# Patient Record
Sex: Male | Born: 1965
Health system: Southern US, Community
[De-identification: ages and names within clinical notes are randomized; demographics above are authoritative.]

## PROBLEM LIST (undated history)

## (undated) DIAGNOSIS — F419 Anxiety disorder, unspecified: Secondary | ICD-10-CM

## (undated) DIAGNOSIS — K219 Gastro-esophageal reflux disease without esophagitis: Secondary | ICD-10-CM

## (undated) DIAGNOSIS — K829 Disease of gallbladder, unspecified: Secondary | ICD-10-CM

## (undated) DIAGNOSIS — I10 Essential (primary) hypertension: Secondary | ICD-10-CM

## (undated) DIAGNOSIS — R7989 Other specified abnormal findings of blood chemistry: Secondary | ICD-10-CM

## (undated) DIAGNOSIS — M2550 Pain in unspecified joint: Secondary | ICD-10-CM

## (undated) DIAGNOSIS — G4730 Sleep apnea, unspecified: Secondary | ICD-10-CM

## (undated) DIAGNOSIS — M1990 Unspecified osteoarthritis, unspecified site: Secondary | ICD-10-CM

## (undated) DIAGNOSIS — M109 Gout, unspecified: Secondary | ICD-10-CM

## (undated) DIAGNOSIS — E291 Testicular hypofunction: Secondary | ICD-10-CM

## (undated) DIAGNOSIS — E119 Type 2 diabetes mellitus without complications: Secondary | ICD-10-CM

## (undated) DIAGNOSIS — E785 Hyperlipidemia, unspecified: Secondary | ICD-10-CM

## (undated) DIAGNOSIS — K8020 Calculus of gallbladder without cholecystitis without obstruction: Secondary | ICD-10-CM

## (undated) DIAGNOSIS — R739 Hyperglycemia, unspecified: Secondary | ICD-10-CM

## (undated) DIAGNOSIS — K589 Irritable bowel syndrome without diarrhea: Secondary | ICD-10-CM

## (undated) DIAGNOSIS — E669 Obesity, unspecified: Secondary | ICD-10-CM

## (undated) DIAGNOSIS — R112 Nausea with vomiting, unspecified: Secondary | ICD-10-CM

## (undated) DIAGNOSIS — F329 Major depressive disorder, single episode, unspecified: Secondary | ICD-10-CM

## (undated) DIAGNOSIS — F1010 Alcohol abuse, uncomplicated: Secondary | ICD-10-CM

## (undated) DIAGNOSIS — Z9889 Other specified postprocedural states: Secondary | ICD-10-CM

## (undated) HISTORY — DX: Gastro-esophageal reflux disease without esophagitis: K21.9

## (undated) HISTORY — PX: REPAIR KNEE LIGAMENT: SUR1188

## (undated) HISTORY — DX: Type 2 diabetes mellitus without complications: E11.9

## (undated) HISTORY — DX: Obesity, unspecified: E66.9

## (undated) HISTORY — DX: Anxiety disorder, unspecified: F41.9

## (undated) HISTORY — DX: Hyperlipidemia, unspecified: E78.5

## (undated) HISTORY — DX: Unspecified osteoarthritis, unspecified site: M19.90

## (undated) HISTORY — DX: Calculus of gallbladder without cholecystitis without obstruction: K80.20

## (undated) HISTORY — DX: Sleep apnea, unspecified: G47.30

## (undated) HISTORY — DX: Gout, unspecified: M10.9

## (undated) HISTORY — DX: Alcohol abuse, uncomplicated: F10.10

## (undated) HISTORY — DX: Major depressive disorder, single episode, unspecified: F32.9

## (undated) HISTORY — PX: KNEE ARTHROSCOPY: SUR90

## (undated) HISTORY — DX: Hyperglycemia, unspecified: R73.9

## (undated) HISTORY — DX: Disease of gallbladder, unspecified: K82.9

## (undated) HISTORY — DX: Essential (primary) hypertension: I10

## (undated) HISTORY — DX: Other specified abnormal findings of blood chemistry: R79.89

## (undated) HISTORY — PX: OTHER SURGICAL HISTORY: SHX169

## (undated) HISTORY — DX: Irritable bowel syndrome, unspecified: K58.9

## (undated) HISTORY — DX: Testicular hypofunction: E29.1

## (undated) HISTORY — DX: Pain in unspecified joint: M25.50

---

## 1999-05-24 ENCOUNTER — Emergency Department (HOSPITAL_COMMUNITY): Admission: EM | Admit: 1999-05-24 | Discharge: 1999-05-24 | Payer: Self-pay | Admitting: Emergency Medicine

## 2002-05-06 ENCOUNTER — Emergency Department (HOSPITAL_COMMUNITY): Admission: EM | Admit: 2002-05-06 | Discharge: 2002-05-06 | Payer: Self-pay | Admitting: Emergency Medicine

## 2002-08-10 ENCOUNTER — Ambulatory Visit (HOSPITAL_BASED_OUTPATIENT_CLINIC_OR_DEPARTMENT_OTHER): Admission: RE | Admit: 2002-08-10 | Discharge: 2002-08-10 | Payer: Self-pay | Admitting: General Surgery

## 2002-08-10 ENCOUNTER — Encounter (INDEPENDENT_AMBULATORY_CARE_PROVIDER_SITE_OTHER): Payer: Self-pay | Admitting: *Deleted

## 2003-08-23 ENCOUNTER — Emergency Department (HOSPITAL_COMMUNITY): Admission: EM | Admit: 2003-08-23 | Discharge: 2003-08-23 | Payer: Self-pay | Admitting: Emergency Medicine

## 2004-05-26 ENCOUNTER — Ambulatory Visit: Payer: Self-pay | Admitting: Internal Medicine

## 2004-06-02 ENCOUNTER — Ambulatory Visit: Payer: Self-pay | Admitting: Internal Medicine

## 2004-07-15 ENCOUNTER — Ambulatory Visit: Payer: Self-pay | Admitting: Internal Medicine

## 2004-09-11 ENCOUNTER — Ambulatory Visit: Payer: Self-pay | Admitting: Internal Medicine

## 2004-11-05 ENCOUNTER — Ambulatory Visit: Payer: Self-pay | Admitting: Internal Medicine

## 2005-09-29 ENCOUNTER — Ambulatory Visit: Payer: Self-pay | Admitting: Internal Medicine

## 2005-10-07 ENCOUNTER — Ambulatory Visit: Payer: Self-pay | Admitting: Internal Medicine

## 2005-10-18 ENCOUNTER — Ambulatory Visit: Payer: Self-pay | Admitting: Internal Medicine

## 2005-11-29 ENCOUNTER — Encounter: Admission: RE | Admit: 2005-11-29 | Discharge: 2005-11-29 | Payer: Self-pay | Admitting: Occupational Medicine

## 2006-04-11 ENCOUNTER — Encounter: Admission: RE | Admit: 2006-04-11 | Discharge: 2006-04-11 | Payer: Self-pay | Admitting: Orthopedic Surgery

## 2006-05-05 ENCOUNTER — Encounter: Admission: RE | Admit: 2006-05-05 | Discharge: 2006-05-05 | Payer: Self-pay | Admitting: Orthopedic Surgery

## 2006-06-07 ENCOUNTER — Ambulatory Visit: Payer: Self-pay | Admitting: Internal Medicine

## 2006-06-07 LAB — CONVERTED CEMR LAB
Glucose, Bld: 336 mg/dL — ABNORMAL HIGH (ref 70–99)
Hgb A1c MFr Bld: 11.3 % — ABNORMAL HIGH (ref 4.6–6.0)

## 2006-06-09 ENCOUNTER — Ambulatory Visit: Payer: Self-pay | Admitting: Internal Medicine

## 2006-07-19 ENCOUNTER — Encounter: Admission: RE | Admit: 2006-07-19 | Discharge: 2006-10-17 | Payer: Self-pay | Admitting: Internal Medicine

## 2006-08-15 ENCOUNTER — Ambulatory Visit: Payer: Self-pay | Admitting: Internal Medicine

## 2006-08-16 ENCOUNTER — Encounter: Payer: Self-pay | Admitting: Internal Medicine

## 2006-11-28 ENCOUNTER — Ambulatory Visit: Payer: Self-pay | Admitting: Internal Medicine

## 2006-11-29 LAB — CONVERTED CEMR LAB
Cholesterol: 152 mg/dL (ref 0–200)
LDL Cholesterol: 99 mg/dL (ref 0–99)
VLDL: 15 mg/dL (ref 0–40)

## 2007-03-15 ENCOUNTER — Encounter: Payer: Self-pay | Admitting: Internal Medicine

## 2007-03-15 DIAGNOSIS — R7309 Other abnormal glucose: Secondary | ICD-10-CM | POA: Insufficient documentation

## 2007-03-15 DIAGNOSIS — I10 Essential (primary) hypertension: Secondary | ICD-10-CM | POA: Insufficient documentation

## 2007-03-15 DIAGNOSIS — E669 Obesity, unspecified: Secondary | ICD-10-CM | POA: Insufficient documentation

## 2007-03-15 DIAGNOSIS — E785 Hyperlipidemia, unspecified: Secondary | ICD-10-CM | POA: Insufficient documentation

## 2007-06-23 ENCOUNTER — Ambulatory Visit: Payer: Self-pay | Admitting: Internal Medicine

## 2007-06-23 DIAGNOSIS — L989 Disorder of the skin and subcutaneous tissue, unspecified: Secondary | ICD-10-CM | POA: Insufficient documentation

## 2007-06-23 DIAGNOSIS — H6120 Impacted cerumen, unspecified ear: Secondary | ICD-10-CM | POA: Insufficient documentation

## 2007-06-23 LAB — CONVERTED CEMR LAB
BUN: 9 mg/dL (ref 6–23)
Calcium: 9.4 mg/dL (ref 8.4–10.5)
GFR calc Af Amer: 119 mL/min
GFR calc non Af Amer: 98 mL/min
Hgb A1c MFr Bld: 5.8 % (ref 4.6–6.0)

## 2007-06-25 ENCOUNTER — Encounter: Payer: Self-pay | Admitting: Internal Medicine

## 2007-06-28 ENCOUNTER — Telehealth: Payer: Self-pay | Admitting: Internal Medicine

## 2007-10-10 ENCOUNTER — Ambulatory Visit: Payer: Self-pay | Admitting: Internal Medicine

## 2007-10-10 LAB — CONVERTED CEMR LAB: Hgb A1c MFr Bld: 6 % (ref 4.6–6.0)

## 2007-10-12 ENCOUNTER — Encounter: Payer: Self-pay | Admitting: Internal Medicine

## 2007-11-08 ENCOUNTER — Ambulatory Visit: Payer: Self-pay | Admitting: Internal Medicine

## 2007-11-08 DIAGNOSIS — D485 Neoplasm of uncertain behavior of skin: Secondary | ICD-10-CM | POA: Insufficient documentation

## 2007-11-15 ENCOUNTER — Ambulatory Visit: Payer: Self-pay | Admitting: Internal Medicine

## 2007-11-23 ENCOUNTER — Ambulatory Visit: Payer: Self-pay | Admitting: Internal Medicine

## 2007-11-23 DIAGNOSIS — J069 Acute upper respiratory infection, unspecified: Secondary | ICD-10-CM | POA: Insufficient documentation

## 2008-07-10 ENCOUNTER — Encounter: Admission: RE | Admit: 2008-07-10 | Discharge: 2008-07-10 | Payer: Self-pay | Admitting: Internal Medicine

## 2009-10-17 ENCOUNTER — Encounter: Payer: Self-pay | Admitting: Internal Medicine

## 2010-08-11 ENCOUNTER — Telehealth (INDEPENDENT_AMBULATORY_CARE_PROVIDER_SITE_OTHER): Payer: Self-pay | Admitting: *Deleted

## 2010-08-13 NOTE — Letter (Signed)
Summary: Michael Litter MD  Michael Litter MD   Imported By: Sherian Rein 10/27/2009 08:27:59  _____________________________________________________________________  External Attachment:    Type:   Image     Comment:   External Document

## 2010-08-19 NOTE — Progress Notes (Signed)
  Phone Note Other Incoming   Request: Send information Summary of Call: Request for records received from [employer]. Request forwarded to [employer].  11/18/2005-11/18/2008.

## 2010-09-23 ENCOUNTER — Other Ambulatory Visit: Payer: Self-pay

## 2010-09-29 ENCOUNTER — Encounter: Payer: Self-pay | Admitting: Internal Medicine

## 2010-10-22 ENCOUNTER — Encounter: Payer: Self-pay | Admitting: Sports Medicine

## 2010-10-22 ENCOUNTER — Ambulatory Visit (INDEPENDENT_AMBULATORY_CARE_PROVIDER_SITE_OTHER): Payer: BLUE CROSS/BLUE SHIELD | Admitting: Sports Medicine

## 2010-10-22 DIAGNOSIS — R269 Unspecified abnormalities of gait and mobility: Secondary | ICD-10-CM

## 2010-10-22 DIAGNOSIS — M179 Osteoarthritis of knee, unspecified: Secondary | ICD-10-CM

## 2010-10-22 DIAGNOSIS — M25562 Pain in left knee: Secondary | ICD-10-CM

## 2010-10-22 DIAGNOSIS — IMO0002 Reserved for concepts with insufficient information to code with codable children: Secondary | ICD-10-CM

## 2010-10-22 DIAGNOSIS — M25561 Pain in right knee: Secondary | ICD-10-CM

## 2010-10-22 DIAGNOSIS — M25569 Pain in unspecified knee: Secondary | ICD-10-CM

## 2010-10-22 DIAGNOSIS — M1710 Unilateral primary osteoarthritis, unspecified knee: Secondary | ICD-10-CM

## 2010-10-22 MED ORDER — AMITRIPTYLINE HCL 25 MG PO TABS: 25.0000 mg | ORAL_TABLET | Freq: Every day | ORAL | Status: DC

## 2010-10-22 NOTE — Assessment & Plan Note (Signed)
-   Bilateral knee pain secondary to severe arthritis. - Patient has artery had extensive therapy for his knees including multiple cortisone injections, Visco supplementation, physical therapy, hinged knee braces, and arthroscopy without improvement.  He is currently on the appropriate medications as well. - Will increase tramadol to 50 mg 2 caps twice a day to help with his pain. - Continue meloxicam at current dose. - Started on amitriptyline 25 mg each bedtime to help with nighttime symptoms. - Continue knee braces. - At this time only conservative measures remaining include orthotics, he was fitted with sports insoles with scaphoid pads in hopes of providing more cushion. If he gets good response from these, would possibly consider custom orthotics in the future. - Did emphasize that based on severity of arthritis total knee replacement as definitive treatment and may help improve his quality of life. - He can followup with [place] on an as-needed basis. - Followup with Dr. Thomasena Edis as scheduled.

## 2010-10-22 NOTE — Progress Notes (Signed)
  Subjective:    Patient ID: [name], male    DOB: Dec 03, 1965, 45 y.o.   MRN: [id number]  HPI 45 year old male to the office for evaluation of bilateral knee pain. Has had ongoing knee pain for several years has undergone extensive treatment with Dr. Thomasena Edis at [facility].  Pain started several years ago following injuries related to [employer], patient was in the [employer] and had done service in the [place]. Been experiencing increasing knee pain recently. Underwent left knee arthroscopy 08/2009 by Dr. Thomasena Edis for meniscal tear and right knee arthroscopy 06/29/10 also for meniscal tear, both knees were noted to have severe arthritis with grade 4 changes of medial compartments bilaterally and grade 3-4 changes in the lateral compartments of both knees.  Has completed multiple cortisone injections & 2 series of Visco supplementation on the left knee and 1 series on the right knee without improvement. Physical therapy has not been helpful in the past. He is wearing double hinged knee braces which do offer some support. Current medications include tramadol 50 mg twice a day, Mobic, and hydrocodone as needed. Currently works in Set [employer] and is on his feet for long periods of time and has significant pain at the end of the day. Has significant nighttime pain. Significant stiffness throughout the day.  Previous x-rays nor surgical records available for review from Dr. Thomasena Edis.  No chronic medical conditions. NKDA Current medications: Tramadol, Mobic, hydrocodone as needed   Review of Systems    per history of present illness, otherwise review of systems is negative Objective:   Physical Exam GENERAL: Alert and oriented x3, no acute distress, pleasant SKIN: No rashes or lesions Respirations: 15 and unlabored MSK: - Right knee: Range of motion -5-110 with significant pain. Arthroscopy portals well healed. Positive joint effusion with increased warmth, no surrounding erythema or  bruising. Exquisitely tender over medial and lateral joint lines. No ligamentous laxity. - Left knee:  Range of motion -5-110 with significant pain. Arthroscopy portals well healed. Small joint effusion without increased warmth or surrounding erythema or bruising. Exquisitely tender over medial and lateral joint lines. No ligamentous laxity. - Feet: bilateral pes planus, large bunion on left foot,  limited great toe motion bilaterally - Gait: Noted to have increased genu varum with standing.  No leg length difference. Walks with significantly antalgic gait favoring his right leg. NEURO: Sensation intact to light touch VASCULAR: Pulses 2+ and symmetric dorsalis pedis and posterior tibialis bilaterally       Assessment & Plan:

## 2010-10-22 NOTE — Assessment & Plan Note (Signed)
Bilateral pes planus and increased genu varum related to knee arthritis Fitted with sports insoles with scaphoid pads as noted above.

## 2010-10-22 NOTE — Patient Instructions (Signed)
You have tried all proper treatments to this point, you are likely eligible for disability given the advanced arthritis you have. Start taking amitriptyline 25mg  1 tab at bedtime. Increase your tramadol to 50mg  2-tabs twice a day. Wear the insoles in your shoes, if comfortable & helpful we could consider custom orthotics in the future. Follow-up with [place] as needed. Follow-up with Dr. Thomasena Edis as scheduled.

## 2010-10-22 NOTE — Assessment & Plan Note (Signed)
Severe arthritis of both knees with grade 3-4 changes noted on arthroscopy by Dr. Thomasena Edis in the past. Given the extent of arthritis and pain patient would likely be eligible for disability. He will look into this in the future and notify us if we can help him with any disability paperwork. Plan as noted above.

## 2010-11-20 ENCOUNTER — Other Ambulatory Visit: Payer: Self-pay | Admitting: *Deleted

## 2010-11-20 MED ORDER — TRAMADOL HCL 50 MG PO TABS: 50.0000 mg | ORAL_TABLET | Freq: Two times a day (BID) | ORAL | Status: DC | PRN

## 2010-11-20 MED ORDER — MELOXICAM 15 MG PO TABS: 15.0000 mg | ORAL_TABLET | Freq: Every day | ORAL | Status: DC

## 2010-11-27 ENCOUNTER — Other Ambulatory Visit: Payer: Self-pay

## 2010-11-27 ENCOUNTER — Other Ambulatory Visit: Payer: Self-pay | Admitting: Internal Medicine

## 2010-11-27 DIAGNOSIS — Z0000 Encounter for general adult medical examination without abnormal findings: Secondary | ICD-10-CM

## 2010-11-27 NOTE — Op Note (Signed)
   NAME:  EVO, ADERMAN                            ACCOUNT NO.:  [id number]   MEDICAL RECORD NO.:  [id number]                   PATIENT TYPE:  AMB   LOCATION:  DSC                                  FACILITY:  [facility]   PHYSICIAN:  Rose Phi. Maple Hudson, M.D.                DATE OF BIRTH:  27-Jun-1966   DATE OF PROCEDURE:  08/10/2002  DATE OF DISCHARGE:                                 OPERATIVE REPORT   PREOPERATIVE DIAGNOSIS:  Cyst of the back.   POSTOPERATIVE DIAGNOSIS:  Cyst of the back.   PROCEDURE:  Excision of cyst of the back.   SURGEON:  Rose Phi. Maple Hudson, M.D.   ANESTHESIA:  General.   DESCRIPTION OF PROCEDURE:  This patient had had a previous I&D of an  infected cyst on his back measuring some 2 cm in diameter.  It is healed up  and now scheduled for excision.   After a suitable general anesthesia was induced, the patient was placed in  the prone position and then the back prepped and draped in the usual  fashion.  A longitudinal incision was made, and we excised the entire cyst.  There was no contamination.  The defect was about 6 x 3 cm.  A single-layer  closure of interrupted 4-0 nylon was carried out.  Dressing applied.  The  patient was transferred to the recovery room in satisfactory condition,  having tolerated the procedure well.                                               Rose Phi. Maple Hudson, M.D.    PRY/MEDQ  D:  08/10/2002  T:  08/10/2002  Job:  [id number]

## 2010-11-30 ENCOUNTER — Encounter: Payer: Self-pay | Admitting: Internal Medicine

## 2011-01-01 ENCOUNTER — Other Ambulatory Visit: Payer: Self-pay | Admitting: Sports Medicine

## 2011-01-20 ENCOUNTER — Other Ambulatory Visit: Payer: Self-pay | Admitting: Orthopedic Surgery

## 2011-01-20 ENCOUNTER — Encounter (HOSPITAL_COMMUNITY): Payer: BLUE CROSS/BLUE SHIELD

## 2011-01-20 LAB — BASIC METABOLIC PANEL
CO2: 28 mEq/L (ref 19–32)
Calcium: 9.4 mg/dL (ref 8.4–10.5)
Chloride: 103 mEq/L (ref 96–112)
Creatinine, Ser: 0.79 mg/dL (ref 0.50–1.35)
Glucose, Bld: 120 mg/dL — ABNORMAL HIGH (ref 70–99)

## 2011-01-20 LAB — DIFFERENTIAL
Eosinophils Absolute: 0.1 10*3/uL (ref 0.0–0.7)
Eosinophils Relative: 1 % (ref 0–5)
Lymphs Abs: 2.1 10*3/uL (ref 0.7–4.0)
Monocytes Absolute: 0.6 10*3/uL (ref 0.1–1.0)
Monocytes Relative: 9 % (ref 3–12)

## 2011-01-20 LAB — URINALYSIS, ROUTINE W REFLEX MICROSCOPIC
Ketones, ur: NEGATIVE mg/dL
Leukocytes, UA: NEGATIVE
Protein, ur: NEGATIVE mg/dL
Urobilinogen, UA: 1 mg/dL (ref 0.0–1.0)

## 2011-01-20 LAB — CBC
MCH: 25.2 pg — ABNORMAL LOW (ref 26.0–34.0)
MCHC: 31.8 g/dL (ref 30.0–36.0)
MCV: 79.3 fL (ref 78.0–100.0)
Platelets: 201 10*3/uL (ref 150–400)
RDW: 13.9 % (ref 11.5–15.5)

## 2011-01-20 LAB — SURGICAL PCR SCREEN: MRSA, PCR: NEGATIVE

## 2011-01-26 ENCOUNTER — Inpatient Hospital Stay (HOSPITAL_COMMUNITY)
Admission: RE | Admit: 2011-01-26 | Discharge: 2011-01-28 | DRG: 209 | Disposition: A | Discharge status: Home Health | Payer: BLUE CROSS/BLUE SHIELD | Source: Ambulatory Visit | Attending: Orthopedic Surgery | Admitting: Orthopedic Surgery

## 2011-01-26 DIAGNOSIS — E119 Type 2 diabetes mellitus without complications: Secondary | ICD-10-CM | POA: Diagnosis present

## 2011-01-26 DIAGNOSIS — I10 Essential (primary) hypertension: Secondary | ICD-10-CM | POA: Diagnosis present

## 2011-01-26 DIAGNOSIS — Z01812 Encounter for preprocedural laboratory examination: Secondary | ICD-10-CM

## 2011-01-26 DIAGNOSIS — M1710 Unilateral primary osteoarthritis, unspecified knee: Principal | ICD-10-CM | POA: Diagnosis present

## 2011-01-26 DIAGNOSIS — E669 Obesity, unspecified: Secondary | ICD-10-CM | POA: Diagnosis present

## 2011-01-26 HISTORY — PX: JOINT REPLACEMENT: SHX530

## 2011-01-26 LAB — GLUCOSE, CAPILLARY
Glucose-Capillary: 123 mg/dL — ABNORMAL HIGH (ref 70–99)
Glucose-Capillary: 129 mg/dL — ABNORMAL HIGH (ref 70–99)

## 2011-01-27 LAB — CBC
MCV: 79.1 fL (ref 78.0–100.0)
Platelets: 172 10*3/uL (ref 150–400)
RBC: 4.22 MIL/uL (ref 4.22–5.81)
RDW: 13.9 % (ref 11.5–15.5)
WBC: 9.3 10*3/uL (ref 4.0–10.5)

## 2011-01-27 LAB — BASIC METABOLIC PANEL
CO2: 28 mEq/L (ref 19–32)
Chloride: 101 mEq/L (ref 96–112)
GFR calc Af Amer: >60 mL/min (ref >60)
Potassium: 4.1 mEq/L (ref 3.5–5.1)
Sodium: 135 mEq/L (ref 135–145)

## 2011-01-28 LAB — BASIC METABOLIC PANEL
BUN: 7 mg/dL (ref 6–23)
CO2: 32 mEq/L (ref 19–32)
Chloride: 98 mEq/L (ref 96–112)
Creatinine, Ser: 0.87 mg/dL (ref 0.50–1.35)
GFR calc Af Amer: >60 mL/min (ref >60)
Glucose, Bld: 126 mg/dL — ABNORMAL HIGH (ref 70–99)
Potassium: 3.8 mEq/L (ref 3.5–5.1)

## 2011-01-28 LAB — CBC
HCT: 32.6 % — ABNORMAL LOW (ref 39.0–52.0)
Hemoglobin: 10.5 g/dL — ABNORMAL LOW (ref 13.0–17.0)
MCV: 79.1 fL (ref 78.0–100.0)
RBC: 4.12 MIL/uL — ABNORMAL LOW (ref 4.22–5.81)
RDW: 13.7 % (ref 11.5–15.5)
WBC: 11.1 10*3/uL — ABNORMAL HIGH (ref 4.0–10.5)

## 2011-02-01 NOTE — Op Note (Signed)
NAMEFRUTOSO, DIMARE                  ACCOUNT NO.:  [id number]  MEDICAL RECORD NO.:  [id number]  LOCATION:  0003                         FACILITY:  [facility]  PHYSICIAN:  Madlyn Frankel. Charlann Boxer, M.D.  DATE OF BIRTH:  08/16/1965  DATE OF PROCEDURE:  01/26/2011 DATE OF DISCHARGE:                              OPERATIVE REPORT   PREOPERATIVE DIAGNOSIS:  Right knee osteoarthritis.  POSTOPERATIVE DIAGNOSIS:  Right knee osteoarthritis.  PROCEDURE:  Right total knee replacement.  COMPONENTS USED:  DePuy rotating platform posterior stabilized knee system with size 6 femur, 6 tibia, 12.5 insert and a 41 patellar button.  SURGEON:  Madlyn Frankel. Charlann Boxer, M.D.  ASSISTANT:  Jaquelyn Bitter. Chabon, P.A.  ANESTHESIA:  Spinal.  SPECIMENS:  None.  COMPLICATIONS:  None.  DRAINS:  One Hemovac.  TOURNIQUET TIME:  49 minutes at 250 mmHg.  INDICATIONS FOR PROCEDURE:  Mr. Wissing is a 45 year old gentleman who was referred for surgical consultation due to failure respond to conservative measures for bilateral knee osteoarthritis.  He is a large male with significant pain that is limiting his quality of life.  After extensive review of risk of infection, component failure, need for revision surgery, given his age, the risk of infection, the risk of failure, he wished to proceed with arthroplasty for benefit of pain relief.  The discussion was extensive and exhaustive trying to make certain that he was making the right incision.  He wished to proceed. Consent was obtained for benefit of pain relief.  PROCEDURE IN DETAIL:  The patient was brought to operative theater. Once adequate anesthesia, preoperative antibiotics administered, the patient was positioned supine with the right thigh tourniquet placed. Right lower extremity was prepped and draped in sterile fashion with a right foot placed into Mayo leg holder.  Time-out was performed, identifying the patient, planned procedure and extremity.  Leg  was exsanguinated, tourniquet elevated to 250 mmHg.  Midline incision was made followed by median arthrotomy.  Following initial exposure and debridement, attention was directed to patella.  Precut measurement was approximately 28 to 29 mm.  I resected down to 15 mm using a 41 patellar button to restore height.  Lug holes were drilled and a metal shim was placed to protect patella from retractors and saw blades.  Attention was now directed to the femur.  Femoral canal was opened and drilled, irrigated to try to prevent fatty emboli.  Intramedullary rod was passed at 5 degrees of valgus, 11 mm bone resected off the distal femur for possible need for use of a size 6 femoral component.  Following this cut, the tibia was subluxated anteriorly and using extramedullary guide, a perpendicular cut was made on this proximal tibia, measured at 10 mm off the proximal lateral side.  Following this resection, we confirmed that the medial and lateral collateral ligaments were going to be stable with at least 10 mm insert and balanced.  Also confirmed that the cut was perpendicular in the coronal plane.  At this point, I sized the femur to be a size 6, the size 6 instrumentation was opened.  The size 6 rotation block was then pinned into position, anterior reference using the C-clamp to  set rotation.  The 4 in 1 cutting block was placed and anterior, posterior and chamfer cuts made without difficulty nor notching.  Final box cut was made up the lateral aspect of distal femur.  Following this resection, the tibia was subluxated anteriorly.  The final box cut of the femur was made off the lateral aspect of distal femur.  The tibia subluxated anteriorly and the cut surfacing to be best fit with size 6.  Tibial tray was pinned into position through the tibial tubercle, drilled and keel punched.  Trial reduction was now carried out.  With a trial reduction at 10 mm insert, I felt there was just a  little bit of hyperextension.  The 12.5 insert felt better balance from extension to flexion with stable medial, lateral, and collateral ligaments throughout with normal tracking patella.  Trial components removed.  Sclerotic bone was drilled with a smooth pin to help with cement interdigitation.  At the synovial capsule junction, the knee was injected with 0.25% Marcaine with epinephrine and 1 mL of Toradol, 51 mL total.  The knee was irrigated with normal saline solution and pulse lavage.  Final components were opened and cement mixed.  Final components were then cemented and the knee was brought to extension with 12.5 insert and extruded cement was removed.  Once the cement fully cured, excess cement was removed throughout the knee.  A final 12.5 insert was chosen and placed into the knee.  The knee was re- irrigated.  The tourniquet had been let down after 49 minutes.  A medium Hemovac drain was placed deep.  The extensor mechanism was then reapproximated using #1 Vicryl with the knee in flexion.  The remainder of the wound was closed with 2-0 Vicryl and running 4-0 Monocryl.  The knee was cleaned, dried and dressed sterilely with Dermabond and Aquacel dressing.  Drain site dressed separately.  Ace wrap over the knee.  The patient was then brought to recovery room in stable condition, tolerating the procedure well.     Madlyn Frankel Charlann Boxer, M.D.     MDO/MEDQ  D:  01/26/2011  T:  01/26/2011  Job:  [id number]  Electronically Signed by Durene Romans M.D. on 02/01/2011 10:14:45 AM

## 2011-02-12 ENCOUNTER — Other Ambulatory Visit: Payer: Self-pay | Admitting: Sports Medicine

## 2011-02-13 NOTE — Discharge Summary (Signed)
[name], [name]                  ACCOUNT NO.:  [id number]  MEDICAL RECORD NO.:  [id number]  LOCATION:  1602                         FACILITY:  [facility]  PHYSICIAN:  Madlyn Frankel. Charlann Boxer, M.D.  DATE OF BIRTH:  February 28, 1966  DATE OF ADMISSION:  01/26/2011 DATE OF DISCHARGE:  01/28/2011                              DISCHARGE SUMMARY   PROCEDURE:  Right total knee arthroplasty.  ADMITTING DIAGNOSIS:  Right knee osteoarthritis.  DISCHARGE DIAGNOSIS:  Status post right total knee arthroplasty.  HISTORY OF PRESENT ILLNESS:  The patient is a 45 year old white gentleman with a history of osteoarthritis of the right knee.  The patient has tried several conservative treatments, all of which have failed to alleviate his discomfort.  X-rays in the clinic show osteoarthritis of the right knee.  Various options were discussed with the patient.  The patient wishes to proceed with surgery.  Risks, benefits, and expectations of the procedure were discussed with the patient.  The patient understands risks, benefits, and expectations and wishes to proceed with right total knee arthroplasty by Dr. Charlann Boxer on January 26, 2011.  The patient is a candidate for tranexamic acid.  The patient is also going to home after being discharged from the hospital and has been given his postop medications.  Questions were invited and answered and surgery would proceed as scheduled.  HOSPITAL COURSE:  The patient underwent the above stated procedure on January 26, 2011.  The patient tolerated the procedure well, was brought to the recovery room in good condition and subsequently to the floor.  On postoperative day 1, January 27, 2011, the patient was doing well.  The patient did have difficulty with pain, was placed on a PCA after the night of surgery.  The patient's hematocrit was 33.4.  Hemovac was taken out.  The patient will start physical therapy.  On postop day #2, January 28, 2011, the patient is doing really well, only  complaining of occasional muscle spasm, otherwise afebrile, vital signs stable.  He is distally neurovascularly intact.  The patient's drain dressing was changed.  The patient felt that he was doing well enough to be discharged home.  The patient would be discharged home on January 28, 2011.  DISCHARGE CONDITION:  Good.  DISCHARGE INSTRUCTIONS:  The patient will be discharged to home on January 28, 2011.  The patient will be weight bearing as tolerated.  The patient will maintain his surgical dressing for 8 days, at which time, he will remove it and replace it with gauze and tape.  The patient should keep the area clean until followup.  The patient will follow up with Dr. Charlann Boxer at [facility] in 2 weeks.  DISCHARGE MEDICATIONS: 1. Aspirin 325 mg 1 p.o. b.i.d. x6 weeks. 2. Robaxin 500 mg 1 p.o. q.6 h. p.r.n. muscle spasms. 3. Iron sulfate 325 mg 1 p.o. t.i.d. for 2-3 weeks. 4. Colace 100 mg 1 p.o. b.i.d., constipation. 5. MiraLax 17 g 1 p.o. daily, constipation. 6. Tylenol 1000 mg 1 p.o. q.8 h. 7. Cromolyn sodium ophthalmic 4% 1 drop in both eyes b.i.d.    ______________________________ Lanney Gins, PA   ______________________________ Madlyn Frankel. Charlann Boxer, M.D.  MB/MEDQ  D:  02/10/2011  T:  02/11/2011  Job:  [id number]  Electronically Signed by Lanney Gins PA on 02/11/2011 03:59:19 PM Electronically Signed by Durene Romans M.D. on 02/13/2011 09:17:26 AM

## 2011-02-22 NOTE — H&P (Signed)
  [name], OSOWSKI                  ACCOUNT NO.:  [id number]  MEDICAL RECORD NO.:  [id number]  LOCATION:  1602                         FACILITY:  [facility]  PHYSICIAN:  Madlyn Frankel. Charlann Boxer, M.D.  DATE OF BIRTH:  1966-01-31  DATE OF ADMISSION:  01/26/2011 DATE OF DISCHARGE:  01/28/2011                             HISTORY & PHYSICAL   DATE OF SURGERY:  January 26, 2011.  ADMISSION DIAGNOSIS:  Osteoarthritis, right knee.  HISTORY OF PRESENT ILLNESS:  This is a 45 year old gentleman with a history of osteoarthritis of his right knee with failure to conservative treatment to alleviate his discomfort.  After discussion of treatments, benefits, risks and options, the patient now scheduled for total knee arthroplasty.  Note that he is a candidate for tranexamic acid and will receive that at surgery.  He is given his home prescriptions of aspirin, Robaxin, iron, MiraLax and Colace for postop.  Questions were invited and answered, and Surgery to go ahead as scheduled.  PAST MEDICAL HISTORY:  DRUG ALLERGIES:  None.  CURRENT MEDICATIONS:  Meloxicam 15 mg once a day, tramadol 50 mg one q.6 p.r.n., and hydrocodone p.r.n.  PREVIOUS SURGERIES:  Right elbow surgery, left shoulder surgery x3, right foot surgery x2, cyst removed from his back, and bilateral knee scopes.  SERIOUS MEDICAL ILLNESSES:  None.  FAMILY HISTORY:  Positive for sarcoidosis in the mother and ovarian cancer in the grandmother.  SOCIAL HISTORY:  Single.  He lives at home.  He does not smoke and has about two drinks a month.  REVIEW OF SYSTEMS:  CENTRAL NERVOUS SYSTEM:  Negative for headache, blurred vision, or dizziness.  PULMONARY:  Negative for shortness breath, PND and orthopnea.  CARDIOVASCULAR: Negative for chest pain and palpitations.  GI:  Negative for ulcers, hepatitis.  GU:  Negative for urinary tract difficulty.  MUSCULOSKELETAL:  Positive in the HPI.  PHYSICAL EXAM:  GENERAL:  This is an obese gentleman in no  acute distress. VITAL SIGNS:  BP 138/90, respirations 18, pulse 80 and regular. HEENT:  Head normocephalic.  Nose patent.  Ears patent.  Pupils equal, round, reactive to light.  Throat without injection. NECK:  Supple without adenopathy.  Carotids 2+ without bruit. CHEST:  Clear to auscultation.  No rales or rhonchi.  Respirations 18. HEART:  Regular rate and rhythm at 80 beats per minute without murmur. ABDOMEN:  Soft with active bowel sounds.  No masses, organomegaly. NEUROLOGIC:  The patient alert and oriented to time, place, person. Cranial nerves II-XII grossly intact.  Extremity shows the right knee to have 3-degree flexion contraction with 110 degrees of further flexion. Neurovascular status intact.  IMPRESSION:  Right knee osteoarthritis.  PLAN:  Total knee arthroplasty, right knee.     Jaquelyn Bitter. Chabon, P.A.   ______________________________ Madlyn Frankel Charlann Boxer, M.D.    SJC/MEDQ  D:  01/05/2011  T:  02/19/2011  Job:  [id number]  Electronically Signed by Durene Romans M.D. on 02/22/2011 07:32:47 AM

## 2011-04-19 ENCOUNTER — Ambulatory Visit (INDEPENDENT_AMBULATORY_CARE_PROVIDER_SITE_OTHER): Payer: BC Managed Care – PPO | Admitting: Sports Medicine

## 2011-04-19 ENCOUNTER — Encounter: Payer: Self-pay | Admitting: Sports Medicine

## 2011-04-19 VITALS — BP 121/84 | HR 74 | Ht 74.0 in | Wt 324.0 lb

## 2011-04-19 DIAGNOSIS — M179 Osteoarthritis of knee, unspecified: Secondary | ICD-10-CM | POA: Insufficient documentation

## 2011-04-19 DIAGNOSIS — M25562 Pain in left knee: Secondary | ICD-10-CM

## 2011-04-19 DIAGNOSIS — M25569 Pain in unspecified knee: Secondary | ICD-10-CM

## 2011-04-19 DIAGNOSIS — IMO0002 Reserved for concepts with insufficient information to code with codable children: Secondary | ICD-10-CM

## 2011-04-19 DIAGNOSIS — M1712 Unilateral primary osteoarthritis, left knee: Secondary | ICD-10-CM

## 2011-04-19 DIAGNOSIS — M25561 Pain in right knee: Secondary | ICD-10-CM

## 2011-04-19 DIAGNOSIS — M1710 Unilateral primary osteoarthritis, unspecified knee: Secondary | ICD-10-CM

## 2011-04-19 MED ORDER — TRAMADOL HCL 50 MG PO TABS: 100.0000 mg | ORAL_TABLET | Freq: Two times a day (BID) | ORAL | Status: DC

## 2011-04-19 MED ORDER — AMITRIPTYLINE HCL 25 MG PO TABS: 25.0000 mg | ORAL_TABLET | Freq: Every day | ORAL | Status: DC

## 2011-04-19 MED ORDER — MELOXICAM 15 MG PO TABS: 15.0000 mg | ORAL_TABLET | Freq: Every day | ORAL | Status: DC

## 2011-04-19 NOTE — Assessment & Plan Note (Signed)
Now he has had a knee replacement on the right with good relief of symptoms- Dr Charlann Boxer  Still has severe osteoarthritis of the left knee and we will keep trying symptomatic measures to help him option and see if he can avoid surgery on left

## 2011-04-19 NOTE — Progress Notes (Signed)
  Subjective:    Patient ID: [name], male    DOB: 1965/08/01, 45 y.o.   MRN: [id number]  HPI Pt here for f/u on b/l knee arthritis. Most recently seen here 4/12. Since last visit had R knee replaced 01/26/2011 and currently getting PT 3x/week. Now with increasing L knee pain and weakness. Pain is worse in medial knee. s/p multiple cortisone injections most recently 3 weeks ago which helped for approximately 10 hours He has had viscous supplement series x 2 in L knee. Most recently approx 3 months ago with very little alleviation of symptoms. Currently taking tramadol 50mg  bid, mobic 15mg  qd, oxycodone 5mg  bid. Takes amitriptyline 25mg  prn for sleep. At one point was wearing hinged knee brace, but has stopped. Admits to intermittent giving out and locking up of L knee.    Review of Systems     Objective:   Physical Exam  Constitutional: He appears well-developed.  Cardiovascular:       No pitting LE edema  Neurological:       General sensation to touch intact   R knee: with central, well healing scar. Mild warmth and swelling. Nonpainful ROM. L knee: no warmth/erythema/swelling. No effusion noted. Nontender patella, med/lat joint lines, tibial tuberosity. approx flexion to 90 degrees and full extension. Neg varus/valgus stress and anterior drawer. Apprehension sign neg. Gait: slight trendelenburg to R with varus deformity in L.        Assessment & Plan:  *L knee arthritis -discussed goals of maintaining symptom control. He continues to see orthopeadics and is aware that L total knee replacement is only definitive treatment -For symptom control at this time recommended increasing tramadol to 50mg  QID (from BID), cont amitriptyline 25mg  qhs, cont mobic qd and try to decrease oxycodone as tolerated to a prn medication -heel lift inserted into L shoe to provide medial support. Fitted and applied in clinic and he did report improvement in medial knee pain with  ambulation -recommended consulting knee brace provider for knee sleeve to minimize swelling and provide compression -RTC if persistent pain over next few months.

## 2011-04-26 ENCOUNTER — Telehealth: Payer: Self-pay | Admitting: Internal Medicine

## 2011-04-26 NOTE — Telephone Encounter (Signed)
Received copies from [facility] and Throat,on 04/26/2011. Forwarded  2pages to Dr. Reva Bores review.

## 2011-04-27 ENCOUNTER — Other Ambulatory Visit: Payer: Self-pay | Admitting: Otolaryngology

## 2011-04-27 DIAGNOSIS — R220 Localized swelling, mass and lump, head: Secondary | ICD-10-CM

## 2011-04-27 DIAGNOSIS — R221 Localized swelling, mass and lump, neck: Secondary | ICD-10-CM

## 2011-04-28 ENCOUNTER — Ambulatory Visit
Admission: RE | Admit: 2011-04-28 | Discharge: 2011-04-28 | Disposition: A | Discharge status: Home / Self Care | Payer: BC Managed Care – PPO | Source: Ambulatory Visit | Attending: Otolaryngology | Admitting: Otolaryngology

## 2011-04-28 DIAGNOSIS — R220 Localized swelling, mass and lump, head: Secondary | ICD-10-CM

## 2011-04-28 MED ORDER — IOHEXOL 300 MG/ML  SOLN
75.0000 mL | Freq: Once | INTRAMUSCULAR | Status: AC | PRN
  Administered 2011-04-28: 75 mL via INTRAVENOUS

## 2011-05-04 ENCOUNTER — Telehealth: Payer: Self-pay | Admitting: Internal Medicine

## 2011-05-04 NOTE — Telephone Encounter (Signed)
Received 3 pages from [facility] and Throat. Forwarded to Dr. Debby Bud for review. 05/04/11-ar

## 2011-05-10 ENCOUNTER — Other Ambulatory Visit (HOSPITAL_COMMUNITY): Payer: Self-pay | Admitting: Otolaryngology

## 2011-05-10 DIAGNOSIS — K118 Other diseases of salivary glands: Secondary | ICD-10-CM

## 2011-05-14 ENCOUNTER — Ambulatory Visit (HOSPITAL_COMMUNITY)
Admission: RE | Admit: 2011-05-14 | Discharge: 2011-05-14 | Disposition: A | Discharge status: Home / Self Care | Payer: BC Managed Care – PPO | Source: Ambulatory Visit | Attending: Otolaryngology | Admitting: Otolaryngology

## 2011-05-14 ENCOUNTER — Other Ambulatory Visit (HOSPITAL_COMMUNITY): Payer: Self-pay | Admitting: Otolaryngology

## 2011-05-14 ENCOUNTER — Other Ambulatory Visit: Payer: Self-pay | Admitting: Interventional Radiology

## 2011-05-14 DIAGNOSIS — K116 Mucocele of salivary gland: Secondary | ICD-10-CM | POA: Insufficient documentation

## 2011-05-14 DIAGNOSIS — K118 Other diseases of salivary glands: Secondary | ICD-10-CM

## 2011-05-14 LAB — CBC
Platelets: 229 10*3/uL (ref 150–400)
RDW: 14.6 % (ref 11.5–15.5)
WBC: 7.5 10*3/uL (ref 4.0–10.5)

## 2011-05-14 LAB — APTT: aPTT: 26 seconds (ref 24–37)

## 2011-05-14 LAB — PROTIME-INR: INR: 0.96 (ref 0.00–1.49)

## 2011-05-19 ENCOUNTER — Telehealth: Payer: Self-pay | Admitting: Internal Medicine

## 2011-05-19 NOTE — Telephone Encounter (Signed)
Received copies from [facility] and Throat,on 05/19/11. Forwarded  2pages to Dr. Reva Bores review.

## 2011-07-01 ENCOUNTER — Encounter: Payer: Self-pay | Admitting: Internal Medicine

## 2011-07-01 ENCOUNTER — Other Ambulatory Visit (INDEPENDENT_AMBULATORY_CARE_PROVIDER_SITE_OTHER): Payer: BC Managed Care – PPO

## 2011-07-01 ENCOUNTER — Ambulatory Visit (INDEPENDENT_AMBULATORY_CARE_PROVIDER_SITE_OTHER): Payer: BC Managed Care – PPO | Admitting: Internal Medicine

## 2011-07-01 VITALS — BP 114/80 | HR 72 | Temp 98.4°F | Resp 16 | Wt 358.0 lb

## 2011-07-01 DIAGNOSIS — R10811 Right upper quadrant abdominal tenderness: Secondary | ICD-10-CM

## 2011-07-01 DIAGNOSIS — E785 Hyperlipidemia, unspecified: Secondary | ICD-10-CM

## 2011-07-01 LAB — URINALYSIS, ROUTINE W REFLEX MICROSCOPIC
Bilirubin Urine: NEGATIVE
Hgb urine dipstick: NEGATIVE
Leukocytes, UA: NEGATIVE
Nitrite: NEGATIVE
Urobilinogen, UA: 0.2 (ref 0.0–1.0)

## 2011-07-01 LAB — CBC WITH DIFFERENTIAL/PLATELET
Basophils Absolute: 0 10*3/uL (ref 0.0–0.1)
Hemoglobin: 13.1 g/dL (ref 13.0–17.0)
Lymphocytes Relative: 28.9 % (ref 12.0–46.0)
Monocytes Relative: 9.3 % (ref 3.0–12.0)
Neutro Abs: 5.3 10*3/uL (ref 1.4–7.7)
RBC: 5.03 Mil/uL (ref 4.22–5.81)
RDW: 15.2 % — ABNORMAL HIGH (ref 11.5–14.6)

## 2011-07-01 NOTE — Assessment & Plan Note (Signed)
I do not think that he has an acute abd process, I am concerned about gallstones, hepatitis, pancreatitis, kidney stones, etc so I have ordered labs and have asked him to get an U/S done

## 2011-07-01 NOTE — Assessment & Plan Note (Signed)
I will check his a1c and see if he needs treatment for DM II

## 2011-07-01 NOTE — Patient Instructions (Signed)

## 2011-07-01 NOTE — Progress Notes (Signed)
Subjective:    Patient ID: [name], male    DOB: Jul 30, 1965, 45 y.o.   MRN: [id number]  Abdominal Pain This is a new problem. Episode onset: for 2 weeks. The onset quality is gradual. The problem occurs intermittently. The most recent episode lasted 2 weeks. The problem has been unchanged. The pain is located in the RUQ. The pain is at a severity of 2/10. The pain is mild. The quality of the pain is aching and sharp. The abdominal pain does not radiate. Pertinent negatives include no anorexia, arthralgias, belching, constipation, diarrhea, dysuria, fever, flatus, frequency, headaches, hematochezia, hematuria, melena, myalgias, nausea, vomiting or weight loss. The pain is aggravated by eating. The pain is relieved by nothing. He has tried nothing for the symptoms.      Review of Systems  Constitutional: Negative for fever, chills, weight loss, diaphoresis, activity change, appetite change, fatigue and unexpected weight change.  HENT: Negative.   Eyes: Negative.   Respiratory: Negative for apnea, cough, choking, chest tightness, shortness of breath, wheezing and stridor.   Cardiovascular: Negative for chest pain, palpitations and leg swelling.  Gastrointestinal: Positive for abdominal pain. Negative for nausea, vomiting, diarrhea, constipation, blood in stool, melena, hematochezia, abdominal distention, anal bleeding, rectal pain, anorexia and flatus.  Genitourinary: Negative for dysuria, urgency, frequency, hematuria, flank pain, decreased urine volume, enuresis and difficulty urinating.  Musculoskeletal: Negative for myalgias and arthralgias.  Skin: Negative.   Neurological: Negative for dizziness, tremors, seizures, syncope, facial asymmetry, speech difficulty, weakness, light-headedness, numbness and headaches.  Hematological: Negative for adenopathy. Does not bruise/bleed easily.  Psychiatric/Behavioral: Negative.        Objective:   Physical Exam  Vitals  reviewed. Constitutional: He is oriented to person, place, and time. He appears well-developed and well-nourished. No distress.  HENT:  Head: Normocephalic and atraumatic.  Mouth/Throat: No oropharyngeal exudate.  Eyes: Conjunctivae are normal. Right eye exhibits no discharge. Left eye exhibits no discharge. No scleral icterus.  Neck: Normal range of motion. Neck supple. No JVD present. No tracheal deviation present. No thyromegaly present.  Cardiovascular: Normal rate, regular rhythm, normal heart sounds and intact distal pulses.  Exam reveals no gallop and no friction rub.   No murmur heard. Pulmonary/Chest: Effort normal and breath sounds normal. No stridor. No respiratory distress. He has no wheezes. He has no rales. He exhibits no tenderness.  Abdominal: Soft. Bowel sounds are normal. He exhibits no distension and no mass. There is no tenderness. There is no rebound and no guarding.  Musculoskeletal: Normal range of motion. He exhibits no edema and no tenderness.  Lymphadenopathy:    He has no cervical adenopathy.  Neurological: He is oriented to person, place, and time.  Skin: Skin is warm and dry. No rash noted. He is not diaphoretic. No erythema. No pallor.  Psychiatric: He has a normal mood and affect. His behavior is normal. Judgment and thought content normal.      Lab Results  Component Value Date   WBC 7.5 05/14/2011   HGB 13.3 05/14/2011   HCT 41.1 05/14/2011   PLT 229 05/14/2011   GLUCOSE 126* 01/28/2011   CHOL 152 11/29/2006   TRIG 73 11/29/2006   HDL 38.6* 11/29/2006   LDLCALC 99 11/29/2006   NA 136 01/28/2011   K 3.8 01/28/2011   CL 98 01/28/2011   CREATININE 0.87 01/28/2011   BUN 7 01/28/2011   CO2 32 01/28/2011   INR 0.96 05/14/2011   HGBA1C 6.0 10/10/2007  Assessment & Plan:

## 2011-07-02 ENCOUNTER — Encounter: Payer: Self-pay | Admitting: Internal Medicine

## 2011-07-02 LAB — LIPASE: Lipase: 23 U/L (ref 11.0–59.0)

## 2011-07-02 LAB — COMPREHENSIVE METABOLIC PANEL
ALT: 26 U/L (ref 0–53)
BUN: 15 mg/dL (ref 6–23)
CO2: 28 mEq/L (ref 19–32)
Creatinine, Ser: 0.9 mg/dL (ref 0.4–1.5)
GFR: 119.59 mL/min (ref >60.00)
Glucose, Bld: 91 mg/dL (ref 70–99)
Total Bilirubin: 0.6 mg/dL (ref 0.3–1.2)

## 2011-07-02 LAB — HEMOGLOBIN A1C: Hgb A1c MFr Bld: 6.7 % — ABNORMAL HIGH (ref 4.6–6.5)

## 2011-07-02 LAB — TSH: TSH: 1.32 u[IU]/mL (ref 0.35–5.50)

## 2011-07-02 LAB — AMYLASE: Amylase: 48 U/L (ref 27–131)

## 2011-07-02 LAB — LIPID PANEL
HDL: 37.9 mg/dL — ABNORMAL LOW (ref >39.00)
VLDL: 12.8 mg/dL (ref 0.0–40.0)

## 2011-07-07 ENCOUNTER — Ambulatory Visit: Payer: Self-pay | Admitting: Internal Medicine

## 2011-07-07 ENCOUNTER — Ambulatory Visit
Admission: RE | Admit: 2011-07-07 | Discharge: 2011-07-07 | Disposition: A | Discharge status: Home / Self Care | Payer: BC Managed Care – PPO | Source: Ambulatory Visit | Attending: Internal Medicine | Admitting: Internal Medicine

## 2011-07-07 ENCOUNTER — Other Ambulatory Visit: Payer: Self-pay

## 2011-07-07 DIAGNOSIS — R10811 Right upper quadrant abdominal tenderness: Secondary | ICD-10-CM

## 2011-07-08 ENCOUNTER — Encounter: Payer: Self-pay | Admitting: Internal Medicine

## 2011-07-08 ENCOUNTER — Other Ambulatory Visit (INDEPENDENT_AMBULATORY_CARE_PROVIDER_SITE_OTHER): Payer: Self-pay

## 2011-07-08 ENCOUNTER — Ambulatory Visit (INDEPENDENT_AMBULATORY_CARE_PROVIDER_SITE_OTHER): Payer: BC Managed Care – PPO | Admitting: Internal Medicine

## 2011-07-08 VITALS — BP 122/88 | HR 72 | Temp 98.4°F | Resp 20

## 2011-07-08 DIAGNOSIS — Z0000 Encounter for general adult medical examination without abnormal findings: Secondary | ICD-10-CM

## 2011-07-08 DIAGNOSIS — Z136 Encounter for screening for cardiovascular disorders: Secondary | ICD-10-CM

## 2011-07-08 DIAGNOSIS — E119 Type 2 diabetes mellitus without complications: Secondary | ICD-10-CM | POA: Insufficient documentation

## 2011-07-08 DIAGNOSIS — E291 Testicular hypofunction: Secondary | ICD-10-CM

## 2011-07-08 DIAGNOSIS — K8020 Calculus of gallbladder without cholecystitis without obstruction: Secondary | ICD-10-CM

## 2011-07-08 DIAGNOSIS — IMO0001 Reserved for inherently not codable concepts without codable children: Secondary | ICD-10-CM

## 2011-07-08 DIAGNOSIS — I10 Essential (primary) hypertension: Secondary | ICD-10-CM

## 2011-07-08 DIAGNOSIS — E7800 Pure hypercholesterolemia, unspecified: Secondary | ICD-10-CM

## 2011-07-08 DIAGNOSIS — Z23 Encounter for immunization: Secondary | ICD-10-CM | POA: Insufficient documentation

## 2011-07-08 LAB — HM DIABETES FOOT EXAM: HM Diabetic Foot Exam: NORMAL

## 2011-07-08 LAB — FECAL OCCULT BLOOD, GUAIAC: Fecal Occult Blood: NEGATIVE

## 2011-07-08 MED ORDER — OLMESARTAN MEDOXOMIL 20 MG PO TABS: 20.0000 mg | ORAL_TABLET | Freq: Every day | ORAL | Status: DC

## 2011-07-08 MED ORDER — HYOSCYAMINE SULFATE ER 0.375 MG PO TB12: 0.3750 mg | ORAL_TABLET | Freq: Two times a day (BID) | ORAL | Status: DC | PRN

## 2011-07-08 MED ORDER — ROSUVASTATIN CALCIUM 10 MG PO TABS: 10.0000 mg | ORAL_TABLET | Freq: Every day | ORAL | Status: DC

## 2011-07-08 NOTE — Patient Instructions (Signed)
Diabetes, Type 2 Diabetes is a long-lasting (chronic) disease. In type 2 diabetes, the pancreas does not make enough insulin (a hormone), and the body does not respond normally to the insulin that is made. This type of diabetes was also previously called adult-onset diabetes. It usually occurs after the age of 40, but it can occur at any age.  CAUSES  Type 2 diabetes happens because the pancreasis not making enough insulin or your body has trouble using the insulin that your pancreas does make properly. SYMPTOMS   Drinking more than usual.   Urinating more than usual.   Blurred vision.   Dry, itchy skin.   Frequent infections.   Feeling more tired than usual (fatigue).  DIAGNOSIS The diagnosis of type 2 diabetes is usually made by one of the following tests:  Fasting blood glucose test. You will not eat for at least 8 hours and then take a blood test.   Random blood glucose test. Your blood glucose (sugar) is checked at any time of the day regardless of when you ate.   Oral glucose tolerance test (OGTT). Your blood glucose is measured after you have not eaten (fasted) and then after you drink a glucose containing beverage.  TREATMENT   Healthy eating.   Exercise.   Medicine, if needed.   Monitoring blood glucose.   Seeing your caregiver regularly.  HOME CARE INSTRUCTIONS   Check your blood glucose at least once a day. More frequent monitoring may be necessary, depending on your medicines and on how well your diabetes is controlled. Your caregiver will advise you.   Take your medicine as directed by your caregiver.   Do not smoke.   Make wise food choices. Ask your caregiver for information. Weight loss can improve your diabetes.   Learn about low blood glucose (hypoglycemia) and how to treat it.   Get your eyes checked regularly.   Have a yearly physical exam. Have your blood pressure checked and your blood and urine tested.   Wear a pendant or bracelet saying  that you have diabetes.   Check your feet every night for cuts, sores, blisters, and redness. Let your caregiver know if you have any problems.  SEEK MEDICAL CARE IF:   You have problems keeping your blood glucose in target range.   You have problems with your medicines.   You have symptoms of an illness that do not improve after 24 hours.   You have a sore or wound that is not healing.   You notice a change in vision or a new problem with your vision.   You have a fever.  MAKE SURE YOU:  Understand these instructions.   Will watch your condition.   Will get help right away if you are not doing well or get worse.  Document Released: 06/28/2005 Document Revised: 03/11/2011 Document Reviewed: 12/14/2010 [employer] Patient Information 2012 [employer].Health Maintenance, Males A healthy lifestyle and preventative care can promote health and wellness.  Maintain regular health, dental, and eye exams.   Eat a healthy diet. Foods like vegetables, fruits, whole grains, low-fat dairy products, and lean protein foods contain the nutrients you need without too many calories. Decrease your intake of foods high in solid fats, added sugars, and salt. Get information about a proper diet from your caregiver, if necessary.   Regular physical exercise is one of the most important things you can do for your health. Most adults should get at least 150 minutes of moderate-intensity exercise (any activity   that increases your heart rate and causes you to sweat) each week. In addition, most adults need muscle-strengthening exercises on 2 or more days a week.    Maintain a healthy weight. The body mass index (BMI) is a screening tool to identify possible weight problems. It provides an estimate of body fat based on height and weight. Your caregiver can help determine your BMI, and can help you achieve or maintain a healthy weight. For adults 20 years and older:   A BMI below 18.5 is considered  underweight.   A BMI of 18.5 to 24.9 is normal.   A BMI of 25 to 29.9 is considered overweight.   A BMI of 30 and above is considered obese.   Maintain normal blood lipids and cholesterol by exercising and minimizing your intake of saturated fat. Eat a balanced diet with plenty of fruits and vegetables. Blood tests for lipids and cholesterol should begin at age 20 and be repeated every 5 years. If your lipid or cholesterol levels are high, you are over 50, or you are a high risk for heart disease, you may need your cholesterol levels checked more frequently.Ongoing high lipid and cholesterol levels should be treated with medicines, if diet and exercise are not effective.   If you smoke, find out from your caregiver how to quit. If you do not use tobacco, do not start.   If you choose to drink alcohol, do not exceed 2 drinks per day. One drink is considered to be 12 ounces (355 mL) of beer, 5 ounces (148 mL) of wine, or 1.5 ounces (44 mL) of liquor.   Avoid use of street drugs. Do not share needles with anyone. Ask for help if you need support or instructions about stopping the use of drugs.   High blood pressure causes heart disease and increases the risk of stroke. Blood pressure should be checked at least every 1 to 2 years. Ongoing high blood pressure should be treated with medicines if weight loss and exercise are not effective.   If you are 45 to 46 years old, ask your caregiver if you should take aspirin to prevent heart disease.   Diabetes screening involves taking a blood sample to check your fasting blood sugar level. This should be done once every 3 years, after age 45, if you are within normal weight and without risk factors for diabetes. Testing should be considered at a younger age or be carried out more frequently if you are overweight and have at least 1 risk factor for diabetes.   Colorectal cancer can be detected and often prevented. Most routine colorectal cancer screening  begins at the age of 50 and continues through age 75. However, your caregiver may recommend screening at an earlier age if you have risk factors for colon cancer. On a yearly basis, your caregiver may provide home test kits to check for hidden blood in the stool. Use of a small camera at the end of a tube, to directly examine the colon (sigmoidoscopy or colonoscopy), can detect the earliest forms of colorectal cancer. Talk to your caregiver about this at age 50, when routine screening begins. Direct examination of the colon should be repeated every 5 to 10 years through age 75, unless early forms of pre-cancerous polyps or small growths are found.   Healthy men should no longer receive prostate-specific antigen (PSA) blood tests as part of routine cancer screening. Consult with your caregiver about prostate cancer screening.   Practice safe sex. Use   condoms and avoid high-risk sexual practices to reduce the spread of sexually transmitted infections (STIs).   Use sunscreen with a sun protection factor (SPF) of 30 or greater. Apply sunscreen liberally and repeatedly throughout the day. You should seek shade when your shadow is shorter than you. Protect yourself by wearing long sleeves, pants, a wide-brimmed hat, and sunglasses year round, whenever you are outdoors.   Notify your caregiver of new moles or changes in moles, especially if there is a change in shape or color. Also notify your caregiver if a mole is larger than the size of a pencil eraser.   A one-time screening for abdominal aortic aneurysm (AAA) and surgical repair of large AAAs by sound wave imaging (ultrasonography) is recommended for ages 65 to 75 years who are current or former smokers.   Stay current with your immunizations.  Document Released: 12/25/2007 Document Revised: 03/10/2011 Document Reviewed: 11/23/2010 [employer] Patient Information 2012 [employer]. 

## 2011-07-08 NOTE — Progress Notes (Signed)
Subjective:    Patient ID: [name], male    DOB: [birth year], 45 y.o.   MRN: [id number]  Diabetes He presents for his initial diabetic visit. He has type 2 diabetes mellitus. His disease course has been stable. There are no hypoglycemic associated symptoms. Pertinent negatives for hypoglycemia include no dizziness, headaches, pallor, seizures, speech difficulty or tremors. Pertinent negatives for diabetes include no blurred vision, no chest pain, no fatigue, no foot paresthesias, no foot ulcerations, no polydipsia, no polyphagia, no polyuria, no visual change, no weakness and no weight loss. There are no hypoglycemic complications. Symptoms are stable. There are no diabetic complications. When asked about current treatments, none were reported. His weight is stable. He is following a generally healthy diet. Meal planning includes avoidance of concentrated sweets. He has not had a previous visit with a dietician. He participates in exercise intermittently. An ACE inhibitor/angiotensin II receptor blocker is not being taken. He does not see a podiatrist.Eye exam is current.  Abdominal Pain This is a recurrent problem. The current episode started 1 to 4 weeks ago. The onset quality is gradual. The problem occurs intermittently. Duration: last episode was yesterday, no pain today. The problem has been unchanged. The pain is located in the RUQ. The pain is at a severity of 1/10. The pain is mild. The quality of the pain is cramping. The abdominal pain does not radiate. Pertinent negatives include no anorexia, arthralgias, belching, constipation, diarrhea, dysuria, fever, flatus, frequency, headaches, hematochezia, hematuria, melena, myalgias, nausea, vomiting or weight loss. The pain is aggravated by eating. He has tried nothing for the symptoms. Prior diagnostic workup includes ultrasound (gallstones on recent u/s).      Review of Systems  Constitutional: Negative for fever, chills, weight loss,  diaphoresis, activity change, appetite change, fatigue and unexpected weight change.  HENT: Negative.   Eyes: Negative.  Negative for blurred vision.  Respiratory: Negative for cough, chest tightness, shortness of breath, wheezing and stridor.   Cardiovascular: Negative for chest pain, palpitations and leg swelling.  Gastrointestinal: Positive for abdominal pain. Negative for nausea, vomiting, diarrhea, constipation, blood in stool, melena, hematochezia, abdominal distention, anal bleeding, rectal pain, anorexia and flatus.  Genitourinary: Negative for dysuria, urgency, polyuria, frequency, hematuria, flank pain, decreased urine volume, discharge, penile swelling, scrotal swelling, enuresis, difficulty urinating, genital sores, penile pain and testicular pain.  Musculoskeletal: Negative for myalgias, back pain, joint swelling, arthralgias and gait problem.  Skin: Negative for color change, pallor, rash and wound.  Neurological: Negative for dizziness, tremors, seizures, syncope, facial asymmetry, speech difficulty, weakness, light-headedness, numbness and headaches.  Hematological: Negative for polydipsia, polyphagia and adenopathy. Does not bruise/bleed easily.  Psychiatric/Behavioral: Negative.        Objective:   Physical Exam  Vitals reviewed. Constitutional: He is oriented to person, place, and time. He appears well-developed and well-nourished. No distress.  HENT:  Head: Normocephalic and atraumatic.  Mouth/Throat: Oropharynx is clear and moist. No oropharyngeal exudate.  Eyes: Conjunctivae are normal. Right eye exhibits no discharge. Left eye exhibits no discharge. No scleral icterus.  Neck: Normal range of motion. Neck supple. No JVD present. No tracheal deviation present. No thyromegaly present.  Cardiovascular: Normal rate, regular rhythm, normal heart sounds and intact distal pulses.  Exam reveals no gallop and no friction rub.   No murmur heard. Pulmonary/Chest: Effort normal  and breath sounds normal. No stridor. No respiratory distress. He has no wheezes. He has no rales. He exhibits no tenderness.  Abdominal: Soft. Bowel sounds are  normal. He exhibits no distension and no mass. There is no tenderness. There is no rebound and no guarding. Hernia confirmed negative in the right inguinal area and confirmed negative in the left inguinal area.  Genitourinary: Rectum normal, testes normal and penis normal. Rectal exam shows no external hemorrhoid, no internal hemorrhoid, no fissure, no mass, no tenderness and anal tone normal. Guaiac negative stool. Prostate is not enlarged and not tender. Right testis shows no mass, no swelling and no tenderness. Right testis is descended. Left testis shows no mass, no swelling and no tenderness. Left testis is descended. Circumcised. No penile tenderness. No discharge found.  Musculoskeletal: Normal range of motion. He exhibits no edema and no tenderness.  Lymphadenopathy:    He has no cervical adenopathy.       Right: No inguinal adenopathy present.       Left: No inguinal adenopathy present.  Neurological: He is oriented to person, place, and time.  Skin: Skin is warm and dry. No rash noted. He is not diaphoretic. No erythema. No pallor.  Psychiatric: He has a normal mood and affect. His behavior is normal. Judgment and thought content normal.      Lab Results  Component Value Date   WBC 8.8 07/01/2011   HGB 13.1 07/01/2011   HCT 39.7 07/01/2011   PLT 224.0 07/01/2011   GLUCOSE 91 07/01/2011   CHOL 166 07/01/2011   TRIG 64.0 07/01/2011   HDL 37.90* 07/01/2011   LDLCALC 115* 07/01/2011   ALT 26 07/01/2011   AST 17 07/01/2011   NA 138 07/01/2011   K 4.0 07/01/2011   CL 102 07/01/2011   CREATININE 0.9 07/01/2011   BUN 15 07/01/2011   CO2 28 07/01/2011   TSH 1.32 07/01/2011   INR 0.96 05/14/2011   HGBA1C 6.7* 07/01/2011     Assessment & Plan:

## 2011-07-09 ENCOUNTER — Encounter: Payer: Self-pay | Admitting: Internal Medicine

## 2011-07-09 LAB — TESTOSTERONE, FREE, TOTAL, SHBG
Sex Hormone Binding: 17 nmol/L (ref 13–71)
Testosterone, Free: 47.3 pg/mL (ref 47.0–244.0)
Testosterone-% Free: 2.6 % (ref 1.6–2.9)

## 2011-07-09 NOTE — Assessment & Plan Note (Signed)
He was informed of the diagnosis today and he will improve his lifestyle modifications, he does not want to start meds yet

## 2011-07-09 NOTE — Assessment & Plan Note (Addendum)
Exam done, labs ordered, vaccines updated, pt ed material was given His EKG is normal today

## 2011-07-09 NOTE — Assessment & Plan Note (Signed)
He is referred to general surgery for consideration of cholecystectomy

## 2011-07-09 NOTE — Assessment & Plan Note (Signed)
He needs to start crestor, samples were given

## 2011-07-09 NOTE — Assessment & Plan Note (Signed)
I will check his testosterone level today. 

## 2011-07-09 NOTE — Assessment & Plan Note (Signed)
EKG is normal, start benicar for BP control and renal protection

## 2011-07-11 ENCOUNTER — Other Ambulatory Visit: Payer: Self-pay | Admitting: Sports Medicine

## 2011-07-17 ENCOUNTER — Other Ambulatory Visit: Payer: Self-pay | Admitting: Sports Medicine

## 2011-07-27 ENCOUNTER — Encounter (INDEPENDENT_AMBULATORY_CARE_PROVIDER_SITE_OTHER): Payer: Self-pay | Admitting: General Surgery

## 2011-07-27 ENCOUNTER — Ambulatory Visit (INDEPENDENT_AMBULATORY_CARE_PROVIDER_SITE_OTHER): Payer: BC Managed Care – PPO | Admitting: General Surgery

## 2011-07-27 VITALS — BP 134/78 | HR 68 | Temp 96.9°F | Resp 18 | Ht 74.0 in | Wt 358.9 lb

## 2011-07-27 DIAGNOSIS — K8020 Calculus of gallbladder without cholecystitis without obstruction: Secondary | ICD-10-CM

## 2011-07-27 NOTE — Progress Notes (Signed)
Patient ID: [name], male   DOB: 10/14/1965, 46 y.o.   MRN: [id number]  Chief Complaint  Patient presents with  . Other    Eval of gallbladder with stones    HPI [name] is a 46 y.o. male.  This patient was referred by Dr. Jones for evaluation of symptomatic cholelithiasis. He has had an ultrasound which documents gallstones without any evidence of acute cholecystitis. His LFTs are normal. He has had several "attacks" which started the beginning of December and were present approximately every 2 days and he states were brought in by eating fatty foods. He states that chicken wings especially where stimulating for the pain. He describes outer quadrant pain which radiates the rest of the stomach and lasted approximately one to 3 hours. He would walk around pacing and could not get comfortable and the pain would go away as fast as it came. Could not get any relief. He had some nausea but no vomiting. He states his bowels are okay and denies any blood in the stools. He denies any reflux or heartburn. HPI  Past Medical History  Diagnosis Date  . Hyperlipidemia   . HTN (hypertension)   . Hyperglycemia   . Gallstones   . Arthritis   . Abdominal pain     Past Surgical History  Procedure Date  . Repair knee ligament     Left  . Back cyst excision     x2  . Tennis elbow surgery, nerve entrapment     x2  . Shoulder surgery, reconstruction of joint     Left x 3, Last 02/2007  . Joint replacement 01/26/2011    right knee    Family History  Problem Relation Age of Onset  . Sarcoidosis Mother   . Colon cancer Neg Hx   . Cancer Neg Hx   . Heart disease Neg Hx   . Kidney disease Neg Hx     Social History History  Substance Use Topics  . Smoking status: Never Smoker   . Smokeless tobacco: Never Used  . Alcohol Use: No    No Known Allergies  Current Outpatient Prescriptions  Medication Sig Dispense Refill  . amitriptyline (ELAVIL) 25 MG tablet Take 1 tablet (25 mg total) by  mouth at bedtime.  30 tablet  3  . ANDROGEL PUMP 20.25 MG/ACT (1.62%) GEL Place onto the skin Daily.      . azithromycin (ZITHROMAX) 250 MG tablet Take 2 tablets by mouth on day 1, followed by 1 tablet by mouth daily for 4 days.        . hyoscyamine (LEVBID) 0.375 MG 12 hr tablet Take 1 tablet (0.375 mg total) by mouth every 12 (twelve) hours as needed for cramping.  60 tablet  0  . meloxicam (MOBIC) 15 MG tablet Take 1 tablet (15 mg total) by mouth daily.  30 tablet  3  . meloxicam (MOBIC) 15 MG tablet TAKE 1 TABLET EVERY DAY  30 tablet  1  . olmesartan (BENICAR) 20 MG tablet Take 1 tablet (20 mg total) by mouth daily.  30 tablet  11  . oxyCODONE (OXY IR/ROXICODONE) 5 MG immediate release tablet Take 5 mg by mouth Twice daily as needed.      . rosuvastatin (CRESTOR) 10 MG tablet Take 1 tablet (10 mg total) by mouth daily.  84 tablet  0  . traMADol (ULTRAM) 50 MG tablet TAKE 2 TABLETS TWICE A DAY  100 tablet  1  . VIAGRA 100 MG tablet   Take 100 mg by mouth as needed.        Review of Systems Review of Systems All other review of systems negative or noncontributory except as stated in the HPI  Blood pressure 134/78, pulse 68, temperature 96.9 F (36.1 C), temperature source Temporal, resp. rate 18, height 6' 2" (1.88 m), weight 358 lb 14.4 oz (162.796 kg).  Physical Exam Physical Exam  Vitals reviewed. Constitutional: He is oriented to person, place, and time. He appears well-developed and well-nourished. No distress.  HENT:  Head: Normocephalic and atraumatic.  Mouth/Throat: No oropharyngeal exudate.  Eyes: Conjunctivae and EOM are normal. Pupils are equal, round, and reactive to light. Right eye exhibits no discharge. Left eye exhibits no discharge. No scleral icterus.  Neck: Normal range of motion. No tracheal deviation present.  Cardiovascular: Normal rate, regular rhythm and normal heart sounds.   Pulmonary/Chest: Effort normal and breath sounds normal. No stridor. No respiratory  distress. He has no wheezes. He has no rales. He exhibits no tenderness.  Abdominal: Soft. Bowel sounds are normal. He exhibits no distension and no mass. There is no tenderness. There is no rebound and no guarding.  Musculoskeletal: Normal range of motion. He exhibits no edema and no tenderness.  Neurological: He is alert and oriented to person, place, and time.  Skin: Skin is warm and dry. No rash noted. He is not diaphoretic. No erythema. No pallor.  Psychiatric: He has a normal mood and affect. His behavior is normal. Judgment and thought content normal.    Data Reviewed US and Labs  Assessment    Symptomatic cholelithiasis I do think that his symptoms are likely due to his gallstones. He has ultrasound confirming cholelithiasis but his LFTs are normal. Given his symptoms and the ultrasound results, I have recommended a cystectomy for possible treatment. We discussed the risks of the surgery including infection, bleeding, pain, scarring, persistent symptoms, diarrhea, and injury to bowel or bile ducts, and need for open surgery. Express understanding and desires to proceed with cholecystectomy.  Morbid obesity with hypertension and hyperlipidemia He is interested in weight loss surgery and we will set him up for our informational session to learn more about the procedures and if he is still interested and we would be happy to get him started in the process to weight loss surgery.  Plan    Plan for OR when available       Tru Leopard DAVID 07/27/2011, 9:13 AM    

## 2011-07-28 ENCOUNTER — Encounter (HOSPITAL_COMMUNITY): Payer: Self-pay | Admitting: Pharmacy Technician

## 2011-08-05 ENCOUNTER — Other Ambulatory Visit (HOSPITAL_COMMUNITY): Payer: Self-pay | Admitting: *Deleted

## 2011-08-05 NOTE — Pre-Procedure Instructions (Signed)
[name]  08/05/2011   Your procedure is scheduled on:  January 30th.   Report to [facility] at 11:30 AM.  Call this number if you have problems the morning of surgery: [phone]   Remember:   Do not eat food:After Midnight.   May have clear liquids: up to 4 Hours before arrival.   Clear liquids include soda, tea, black coffee, apple or grape juice, broth.  Take these medicines the morning of surgery with A SIP OF WATER: Oxycodone or Tramadol (Ultram)   Do not wear jewelry, make-up or nail polish.  Do not wear lotions, powders, or perfumes. You may wear deodorant.  Do not shave 48 hours prior to surgery.  Do not bring valuables to the hospital.  Contacts, dentures or bridgework may not be worn into surgery.  Leave suitcase in the car. After surgery it may be brought to your room.  For patients admitted to the hospital, checkout time is 11:00 AM the day of discharge.   Patients discharged the day of surgery will not be allowed to drive home.  Name and phone number of your driver: ---   Special Instructions: CHG Shower Use Special Wash: 1/2 bottle night before surgery and 1/2 bottle morning of surgery.   Please read over the following fact sheets that you were given: Pain Booklet, Coughing and Deep Breathing, MRSA Information and Surgical Site Infection Prevention

## 2011-08-06 ENCOUNTER — Ambulatory Visit (HOSPITAL_COMMUNITY)
Admission: RE | Admit: 2011-08-06 | Discharge: 2011-08-06 | Disposition: A | Discharge status: Home / Self Care | Payer: BC Managed Care – PPO | Source: Ambulatory Visit | Attending: Anesthesiology | Admitting: Anesthesiology

## 2011-08-06 ENCOUNTER — Encounter (HOSPITAL_COMMUNITY): Payer: Self-pay

## 2011-08-06 ENCOUNTER — Encounter (HOSPITAL_COMMUNITY)
Admission: RE | Admit: 2011-08-06 | Discharge: 2011-08-06 | Disposition: A | Discharge status: Home / Self Care | Payer: BC Managed Care – PPO | Source: Ambulatory Visit | Attending: General Surgery | Admitting: General Surgery

## 2011-08-06 DIAGNOSIS — Z01818 Encounter for other preprocedural examination: Secondary | ICD-10-CM | POA: Insufficient documentation

## 2011-08-06 DIAGNOSIS — E785 Hyperlipidemia, unspecified: Secondary | ICD-10-CM | POA: Insufficient documentation

## 2011-08-06 DIAGNOSIS — I10 Essential (primary) hypertension: Secondary | ICD-10-CM | POA: Insufficient documentation

## 2011-08-06 DIAGNOSIS — Z01812 Encounter for preprocedural laboratory examination: Secondary | ICD-10-CM | POA: Insufficient documentation

## 2011-08-06 DIAGNOSIS — K8020 Calculus of gallbladder without cholecystitis without obstruction: Secondary | ICD-10-CM

## 2011-08-06 LAB — BASIC METABOLIC PANEL
CO2: 27 mEq/L (ref 19–32)
Chloride: 103 mEq/L (ref 96–112)
Creatinine, Ser: 0.82 mg/dL (ref 0.50–1.35)
GFR calc Af Amer: >90 mL/min (ref >90)
Potassium: 4.2 mEq/L (ref 3.5–5.1)
Sodium: 137 mEq/L (ref 135–145)

## 2011-08-10 MED ORDER — CEFAZOLIN SODIUM-DEXTROSE 2-3 GM-% IV SOLR
2.0000 g | INTRAVENOUS | Status: AC
  Administered 2011-08-11: 2 g via INTRAVENOUS
  Filled 2011-08-10: qty 50

## 2011-08-11 ENCOUNTER — Telehealth (INDEPENDENT_AMBULATORY_CARE_PROVIDER_SITE_OTHER): Payer: Self-pay

## 2011-08-11 ENCOUNTER — Ambulatory Visit (HOSPITAL_COMMUNITY): Payer: BC Managed Care – PPO | Admitting: Certified Registered"

## 2011-08-11 ENCOUNTER — Other Ambulatory Visit (INDEPENDENT_AMBULATORY_CARE_PROVIDER_SITE_OTHER): Payer: Self-pay | Admitting: General Surgery

## 2011-08-11 ENCOUNTER — Ambulatory Visit (HOSPITAL_COMMUNITY): Payer: BC Managed Care – PPO

## 2011-08-11 ENCOUNTER — Encounter (HOSPITAL_COMMUNITY): Payer: Self-pay | Admitting: Surgery

## 2011-08-11 ENCOUNTER — Encounter (HOSPITAL_COMMUNITY): Payer: Self-pay | Admitting: Certified Registered"

## 2011-08-11 ENCOUNTER — Encounter (HOSPITAL_COMMUNITY)
Admission: RE | Disposition: A | Discharge status: Home / Self Care | Payer: Self-pay | Source: Ambulatory Visit | Attending: General Surgery

## 2011-08-11 ENCOUNTER — Ambulatory Visit (HOSPITAL_COMMUNITY)
Admission: RE | Admit: 2011-08-11 | Discharge: 2011-08-12 | Disposition: A | Discharge status: Home / Self Care | Payer: BC Managed Care – PPO | Source: Ambulatory Visit | Attending: General Surgery | Admitting: General Surgery

## 2011-08-11 DIAGNOSIS — R7309 Other abnormal glucose: Secondary | ICD-10-CM | POA: Insufficient documentation

## 2011-08-11 DIAGNOSIS — IMO0001 Reserved for inherently not codable concepts without codable children: Secondary | ICD-10-CM

## 2011-08-11 DIAGNOSIS — E785 Hyperlipidemia, unspecified: Secondary | ICD-10-CM | POA: Insufficient documentation

## 2011-08-11 DIAGNOSIS — I10 Essential (primary) hypertension: Secondary | ICD-10-CM

## 2011-08-11 DIAGNOSIS — Z79899 Other long term (current) drug therapy: Secondary | ICD-10-CM | POA: Insufficient documentation

## 2011-08-11 DIAGNOSIS — E7800 Pure hypercholesterolemia, unspecified: Secondary | ICD-10-CM

## 2011-08-11 DIAGNOSIS — K8020 Calculus of gallbladder without cholecystitis without obstruction: Secondary | ICD-10-CM

## 2011-08-11 DIAGNOSIS — K8010 Calculus of gallbladder with chronic cholecystitis without obstruction: Secondary | ICD-10-CM

## 2011-08-11 DIAGNOSIS — M129 Arthropathy, unspecified: Secondary | ICD-10-CM | POA: Insufficient documentation

## 2011-08-11 DIAGNOSIS — Z96659 Presence of unspecified artificial knee joint: Secondary | ICD-10-CM | POA: Insufficient documentation

## 2011-08-11 HISTORY — PX: CHOLECYSTECTOMY: SHX55

## 2011-08-11 LAB — CBC
HCT: 39.7 % (ref 39.0–52.0)
Hemoglobin: 13.2 g/dL (ref 13.0–17.0)
MCV: 77.7 fL — ABNORMAL LOW (ref 78.0–100.0)
RBC: 5.11 MIL/uL (ref 4.22–5.81)
WBC: 7.4 10*3/uL (ref 4.0–10.5)

## 2011-08-11 SURGERY — LAPAROSCOPIC CHOLECYSTECTOMY WITH INTRAOPERATIVE CHOLANGIOGRAM
Anesthesia: General | Site: Abdomen | Wound class: Clean Contaminated

## 2011-08-11 MED ORDER — MIDAZOLAM HCL 5 MG/5ML IJ SOLN
INTRAMUSCULAR | Status: DC | PRN
  Administered 2011-08-11: 2 mg via INTRAVENOUS

## 2011-08-11 MED ORDER — GLYCOPYRROLATE 0.2 MG/ML IJ SOLN
INTRAMUSCULAR | Status: DC | PRN
  Administered 2011-08-11: .6 mg via INTRAVENOUS

## 2011-08-11 MED ORDER — 0.9 % SODIUM CHLORIDE (POUR BTL) OPTIME
TOPICAL | Status: DC | PRN
  Administered 2011-08-11: 1000 mL

## 2011-08-11 MED ORDER — OXYCODONE HCL 5 MG PO TABS
5.0000 mg | ORAL_TABLET | ORAL | Status: DC | PRN
  Administered 2011-08-11 – 2011-08-12 (×2): 5 mg via ORAL
  Filled 2011-08-11 (×2): qty 1

## 2011-08-11 MED ORDER — MORPHINE SULFATE 4 MG/ML IJ SOLN
4.0000 mg | INTRAMUSCULAR | Status: DC | PRN
  Administered 2011-08-11 – 2011-08-12 (×2): 4 mg via INTRAVENOUS
  Filled 2011-08-11 (×2): qty 1

## 2011-08-11 MED ORDER — ONDANSETRON HCL 4 MG/2ML IJ SOLN: 4.0000 mg | Freq: Four times a day (QID) | INTRAMUSCULAR | Status: DC | PRN

## 2011-08-11 MED ORDER — NEOSTIGMINE METHYLSULFATE 1 MG/ML IJ SOLN
INTRAMUSCULAR | Status: DC | PRN
  Administered 2011-08-11: 5 mg via INTRAVENOUS

## 2011-08-11 MED ORDER — HYDROMORPHONE HCL PF 1 MG/ML IJ SOLN
0.2500 mg | INTRAMUSCULAR | Status: DC | PRN
  Administered 2011-08-11 (×4): 0.5 mg via INTRAVENOUS

## 2011-08-11 MED ORDER — LACTATED RINGERS IV SOLN
INTRAVENOUS | Status: DC | PRN
  Administered 2011-08-11 (×2): via INTRAVENOUS

## 2011-08-11 MED ORDER — MEPERIDINE HCL 25 MG/ML IJ SOLN: 6.2500 mg | INTRAMUSCULAR | Status: DC | PRN

## 2011-08-11 MED ORDER — ROSUVASTATIN CALCIUM 10 MG PO TABS
10.0000 mg | ORAL_TABLET | Freq: Every day | ORAL | Status: DC
  Administered 2011-08-11 – 2011-08-12 (×2): 10 mg via ORAL
  Filled 2011-08-11 (×2): qty 1

## 2011-08-11 MED ORDER — MORPHINE SULFATE 4 MG/ML IJ SOLN: 0.0500 mg/kg | INTRAMUSCULAR | Status: DC | PRN

## 2011-08-11 MED ORDER — IOHEXOL 300 MG/ML  SOLN
INTRAMUSCULAR | Status: DC | PRN
  Administered 2011-08-11: 9 mL

## 2011-08-11 MED ORDER — POTASSIUM CHLORIDE IN NACL 20-0.9 MEQ/L-% IV SOLN
INTRAVENOUS | Status: DC
  Administered 2011-08-11: 19:00:00 via INTRAVENOUS
  Filled 2011-08-11 (×3): qty 1000

## 2011-08-11 MED ORDER — ENOXAPARIN SODIUM 40 MG/0.4ML ~~LOC~~ SOLN
40.0000 mg | SUBCUTANEOUS | Status: DC
  Filled 2011-08-11: qty 0.4

## 2011-08-11 MED ORDER — LIDOCAINE-EPINEPHRINE 1 %-1:100000 IJ SOLN
INTRAMUSCULAR | Status: DC | PRN
  Administered 2011-08-11: 20 mL via INTRADERMAL

## 2011-08-11 MED ORDER — FENTANYL CITRATE 0.05 MG/ML IJ SOLN
INTRAMUSCULAR | Status: DC | PRN
  Administered 2011-08-11: 150 ug via INTRAVENOUS
  Administered 2011-08-11 (×2): 50 ug via INTRAVENOUS

## 2011-08-11 MED ORDER — KETOROLAC TROMETHAMINE 60 MG/2ML IM SOLN
INTRAMUSCULAR | Status: DC | PRN
  Administered 2011-08-11: 30 mg via INTRAMUSCULAR

## 2011-08-11 MED ORDER — ROCURONIUM BROMIDE 100 MG/10ML IV SOLN
INTRAVENOUS | Status: DC | PRN
  Administered 2011-08-11: 50 mg via INTRAVENOUS
  Administered 2011-08-11: 10 mg via INTRAVENOUS

## 2011-08-11 MED ORDER — ONDANSETRON HCL 4 MG/2ML IJ SOLN: 4.0000 mg | Freq: Once | INTRAMUSCULAR | Status: DC | PRN

## 2011-08-11 MED ORDER — ONDANSETRON HCL 4 MG/2ML IJ SOLN
INTRAMUSCULAR | Status: DC | PRN
  Administered 2011-08-11: 4 mg via INTRAVENOUS

## 2011-08-11 MED ORDER — ONDANSETRON HCL 4 MG PO TABS: 4.0000 mg | ORAL_TABLET | Freq: Four times a day (QID) | ORAL | Status: DC | PRN

## 2011-08-11 MED ORDER — PROPOFOL 10 MG/ML IV EMUL
INTRAVENOUS | Status: DC | PRN
  Administered 2011-08-11: 250 mg via INTRAVENOUS

## 2011-08-11 MED ORDER — OXYCODONE HCL 5 MG PO TABS: 5.0000 mg | ORAL_TABLET | ORAL | Status: AC | PRN

## 2011-08-11 MED ORDER — SODIUM CHLORIDE 0.9 % IR SOLN
Status: DC | PRN
  Administered 2011-08-11: 1000 mL

## 2011-08-11 MED ORDER — OLMESARTAN MEDOXOMIL 20 MG PO TABS
20.0000 mg | ORAL_TABLET | Freq: Every day | ORAL | Status: DC
  Administered 2011-08-11 – 2011-08-12 (×2): 20 mg via ORAL
  Filled 2011-08-11 (×2): qty 1

## 2011-08-11 MED ORDER — BUPIVACAINE HCL (PF) 0.25 % IJ SOLN
INTRAMUSCULAR | Status: DC | PRN
  Administered 2011-08-11: 30 mL

## 2011-08-11 MED ORDER — LACTATED RINGERS IV SOLN
INTRAVENOUS | Status: DC
  Administered 2011-08-11: 13:00:00 via INTRAVENOUS

## 2011-08-11 MED ORDER — MORPHINE SULFATE 2 MG/ML IJ SOLN: 0.0500 mg/kg | INTRAMUSCULAR | Status: DC | PRN

## 2011-08-11 MED ORDER — DEXAMETHASONE SODIUM PHOSPHATE 4 MG/ML IJ SOLN
INTRAMUSCULAR | Status: DC | PRN
  Administered 2011-08-11: 8 mg via INTRAVENOUS

## 2011-08-11 SURGICAL SUPPLY — 48 items
APPLIER CLIP ROT 10 11.4 M/L (STAPLE) ×2
BLADE SURG ROTATE 9660 (MISCELLANEOUS) IMPLANT
CANISTER SUCTION 2500CC (MISCELLANEOUS) ×2 IMPLANT
CATH REDDICK CHOLANGI 4FR 50CM (CATHETERS) ×2 IMPLANT
CHLORAPREP W/TINT 26ML (MISCELLANEOUS) ×2 IMPLANT
CLIP APPLIE ROT 10 11.4 M/L (STAPLE) ×1 IMPLANT
CLOTH BEACON ORANGE TIMEOUT ST (SAFETY) ×2 IMPLANT
COVER SURGICAL LIGHT HANDLE (MISCELLANEOUS) ×2 IMPLANT
DECANTER SPIKE VIAL GLASS SM (MISCELLANEOUS) ×4 IMPLANT
DERMABOND ADVANCED (GAUZE/BANDAGES/DRESSINGS) ×1
DERMABOND ADVANCED .7 DNX12 (GAUZE/BANDAGES/DRESSINGS) ×1 IMPLANT
DEVICE TROCAR PUNCTURE CLOSURE (ENDOMECHANICALS) ×2 IMPLANT
DRAPE C-ARM 42X72 X-RAY (DRAPES) ×2 IMPLANT
ELECT CAUTERY BLADE 6.4 (BLADE) ×2 IMPLANT
ELECT REM PT RETURN 9FT ADLT (ELECTROSURGICAL) ×2
ELECTRODE REM PT RTRN 9FT ADLT (ELECTROSURGICAL) ×1 IMPLANT
GLOVE BIO SURGEON STRL SZ7.5 (GLOVE) ×2 IMPLANT
GLOVE BIOGEL PI IND STRL 7.0 (GLOVE) ×1 IMPLANT
GLOVE BIOGEL PI IND STRL 7.5 (GLOVE) ×1 IMPLANT
GLOVE BIOGEL PI INDICATOR 7.0 (GLOVE) ×1
GLOVE BIOGEL PI INDICATOR 7.5 (GLOVE) ×1
GLOVE EUDERMIC 7 POWDERFREE (GLOVE) ×2 IMPLANT
GLOVE SURG SS PI 6.5 STRL IVOR (GLOVE) ×2 IMPLANT
GLOVE SURG SS PI 7.5 STRL IVOR (GLOVE) ×4 IMPLANT
GOWN PREVENTION PLUS XLARGE (GOWN DISPOSABLE) ×2 IMPLANT
GOWN STRL NON-REIN LRG LVL3 (GOWN DISPOSABLE) ×6 IMPLANT
GOWN STRL REIN XL XLG (GOWN DISPOSABLE) ×2 IMPLANT
IV CATH 14GX2 1/4 (CATHETERS) ×2 IMPLANT
KIT BASIN OR (CUSTOM PROCEDURE TRAY) ×2 IMPLANT
KIT ROOM TURNOVER OR (KITS) ×2 IMPLANT
NS IRRIG 1000ML POUR BTL (IV SOLUTION) ×2 IMPLANT
PAD ARMBOARD 7.5X6 YLW CONV (MISCELLANEOUS) ×4 IMPLANT
PENCIL BUTTON HOLSTER BLD 10FT (ELECTRODE) ×2 IMPLANT
POUCH SPECIMEN RETRIEVAL 10MM (ENDOMECHANICALS) ×2 IMPLANT
SCISSORS LAP 5X35 DISP (ENDOMECHANICALS) IMPLANT
SET IRRIG TUBING LAPAROSCOPIC (IRRIGATION / IRRIGATOR) ×2 IMPLANT
SLEEVE ENDOPATH XCEL 5M (ENDOMECHANICALS) ×2 IMPLANT
SPECIMEN JAR SMALL (MISCELLANEOUS) ×2 IMPLANT
SUT MNCRL AB 4-0 PS2 18 (SUTURE) ×4 IMPLANT
SUT VICRYL 0 UR6 27IN ABS (SUTURE) ×6 IMPLANT
TOWEL OR 17X24 6PK STRL BLUE (TOWEL DISPOSABLE) ×2 IMPLANT
TOWEL OR 17X26 10 PK STRL BLUE (TOWEL DISPOSABLE) ×2 IMPLANT
TRAY FOLEY CATH 14FR (SET/KITS/TRAYS/PACK) IMPLANT
TRAY LAPAROSCOPIC (CUSTOM PROCEDURE TRAY) ×2 IMPLANT
TROCAR HASSON GELL 12X100 (TROCAR) ×2 IMPLANT
TROCAR XCEL NON-BLD 11X100MML (ENDOMECHANICALS) ×2 IMPLANT
TROCAR XCEL NON-BLD 5MMX100MML (ENDOMECHANICALS) ×2 IMPLANT
WATER STERILE IRR 1000ML POUR (IV SOLUTION) IMPLANT

## 2011-08-11 NOTE — Op Note (Signed)
NAMEEVRETT, HAKIM                  ACCOUNT NO.:  [id number]  MEDICAL RECORD NO.:  [id number]  LOCATION:  5153                         FACILITY:  [facility]  PHYSICIAN:  Lodema Pilot, MD       DATE OF BIRTH:  Dec 09, 1965  DATE OF PROCEDURE:  08/11/2011 DATE OF DISCHARGE:                              OPERATIVE REPORT   PROCEDURE:  Laparoscopic cholecystectomy with intraoperative cholangiogram.  PREOPERATIVE DIAGNOSIS:  Symptomatic cholelithiasis.  POSTOPERATIVE DIAGNOSIS:  Symptomatic cholelithiasis.  SURGEON:  Lodema Pilot, MD  ASSISTANT:  Currie Paris, MD  ANESTHESIA:  General endotracheal anesthesia with 40 mL of 1% lidocaine with epinephrine 0.25% Marcaine in a 50:50 mixture.  FLUIDS:  1750 mL crystalloid.  ESTIMATED BLOOD LOSS:  Minimal.  DRAINS:  None.  SPECIMENS:  Gallbladder contents sent to pathology for permanent sectioning.  FINDINGS:  Normal-appearing gallbladder anatomy, although it had several venous tributaries coursing throughout the gallbladder extending up along the posterior wall of the gallbladder which were controlled with hemoclips, normal cholangiogram, multiple small gallstones.  INDICATIONS FOR PROCEDURE:  Mr. Garlow is a 46 year old male with multiple episodes of recurrent symptomatic cholelithiasis, describes as postprandial right upper quadrant pain and ultrasound consistent with cholelithiasis.  LFTs are normal.  OPERATIVE DETAILS:  Mr. Haithcock was seen and evaluated in the preoperative area.  Risks and benefits of procedure were discussed in lay terms. Informed consent was obtained.  He was taken to the operating room and placed on the table in supine position, and general endotracheal anesthesia was obtained, and prophylactic antibiotics were given.  The abdomen was prepped and draped in a standard surgical fashion and a supraumbilical midline incision was made in the skin and dissection carried down through subcutaneous tissue using blunt  dissection. Abdominal fascia was sharply incised and elevated between Kocher clamps. His fascia and peritoneum were very deep, was fairly difficult to access the abdomen, although able to place 0 Vicryl sutures through the fascia and into the peritoneum bluntly.  A 5-mm trocar was placed and pneumoperitoneum was obtained.  Using a 5 mm laparoscope, the 11-mm epigastric port was placed under direct visualization, and knee umbilical trocar was then substituted for 12 mm Hasson port.  The underlying bowel contents were inspected for injury upon entry and none was identified.  Two 5 mm right lateral abdominal trocars were placed under direct visualization.  The gallbladder was retracted cephalad, and the peritoneum was taken down using blunt dissection over the cystic duct.  The cystic duct and gallbladder junction was identified, and the cystic duct was skeletonized using blunt dissection.  Cystic artery was also identified in its usual anatomic position along the medial aspect of the gallbladder and this was skeletonized and clips were placed on the cystic artery, however, is not divided at this time.  A clip was placed on the gallbladder side of the cystic duct and a cholangiogram catheter was passed through the abdominal wall.  The cholangiogram was placed in the cystic duct and clipped in place.  Cholangiogram was performed which demonstrated a long cystic duct and normal right and left hepatic ducts, and normal common bile duct with free flow of bile into  the duodenum.  The cholangiogram catheter was removed and 2 clips were placed on the stay side of the cystic duct and the duct was transected.  The previously clipped cystic artery was divided and the gallbladder was removed from the gallbladder fossa using Bovie electrocautery.  He had several small venous branches coursing onto the gallbladder and so I was generous with using clips for control of these venous branches and these were  all coursing along the gallbladder and were high up on the gallbladder between the gallbladder and the gallbladder fossa.  The gallbladder was not entered during the dissection and gallbladder was completely removed from the gallbladder fossa and placed in an EndoCatch bag.  Gallbladder fossa was inspected for hemostasis, which was noted to be adequate and the clips appeared to be in good position.  Right upper quadrant was irrigated with sterile saline solution.  The irrigation returned clear.  The gallbladder was removed from the umbilical incision, although I did have to enlarge the fascial incision to remove this large gallbladder.  He had small palpable stones within the gallbladder.  The specimen was passed off the table and sent to pathology for permanent section.  The right upper quadrant was again inspected for hemostasis which was noted to be adequate.  There was no evidence of bowel injury.  Clips appeared to be in good position.  Then because of the abdominal wall was so thick and access was difficult, I decided to close this umbilical fascia using 0 Vicryl suture and Endoclose device.  Figure-of-eight suture was placed in addition to the sutures that were already placed in open fashion. This appeared to approximate the fascia well, this was done under laparoscopic guidance and again fascia appeared to be well approximated. The abdominal wall was noted be hemostatic and the epigastric trocar was removed under direct visualization.  Abdominal wall was noted be hemostatic.  The 5 mm trocars were removed and the skin was anesthetized with total of 40 mL of 1% lidocaine with epinephrine and 0.25% Marcaine in a 50:50 mixture.  Skin edges were approximated with 4-0 Monocryl, subcuticular suture.  Skin was washed and dried and Dermabond was applied.  All sponge, needle, and instrument counts were correct at the end of the case.  The patient tolerated procedure well without  apparent complications.          ______________________________ Lodema Pilot, MD     BL/MEDQ  D:  08/11/2011  T:  08/11/2011  Job:  [id number]

## 2011-08-11 NOTE — Anesthesia Preprocedure Evaluation (Addendum)
Anesthesia Evaluation  Patient identified by MRN, date of birth, ID band Patient awake and Patient confused    Reviewed: Allergy & Precautions, H&P , NPO status , Patient's Chart, lab work & pertinent test results, reviewed documented beta blocker date and time   Airway Mallampati: I TM Distance: >3 FB Neck ROM: Full    Dental  (+) Teeth Intact and Dental Advisory Given   Pulmonary  clear to auscultation        Cardiovascular hypertension, Regular Normal    Neuro/Psych    GI/Hepatic   Endo/Other  Morbid obesity  Renal/GU      Musculoskeletal   Abdominal   Peds  Hematology   Anesthesia Other Findings   Reproductive/Obstetrics                          Anesthesia Physical Anesthesia Plan  ASA: III  Anesthesia Plan: General   Post-op Pain Management:    Induction: Intravenous  Airway Management Planned: Oral ETT  Additional Equipment:   Intra-op Plan:   Post-operative Plan: Extubation in OR  Informed Consent:   Dental advisory given  Plan Discussed with: CRNA, Anesthesiologist and Surgeon  Anesthesia Plan Comments:         Anesthesia Quick Evaluation

## 2011-08-11 NOTE — Transfer of Care (Signed)
Immediate Anesthesia Transfer of Care Note  Patient: [name]  Procedure(s) Performed:  LAPAROSCOPIC CHOLECYSTECTOMY WITH INTRAOPERATIVE CHOLANGIOGRAM - Laparoscopic cholecystectomy with cholangiogram.  Patient Location: PACU  Anesthesia Type: General  Level of Consciousness: awake, alert , oriented and patient cooperative  Airway & Oxygen Therapy: Patient Spontanous Breathing and Patient connected to nasal cannula oxygen  Post-op Assessment: Report given to PACU RN, Post -op Vital signs reviewed and stable and Patient moving all extremities X 4  Post vital signs: Reviewed and stable  Complications: No apparent anesthesia complications

## 2011-08-11 NOTE — Anesthesia Postprocedure Evaluation (Signed)
  Anesthesia Post-op Note  Patient: [name]  Procedure(s) Performed:  LAPAROSCOPIC CHOLECYSTECTOMY WITH INTRAOPERATIVE CHOLANGIOGRAM - Laparoscopic cholecystectomy with cholangiogram.  Patient Location: PACU  Anesthesia Type: General  Level of Consciousness: awake  Airway and Oxygen Therapy: Patient Spontanous Breathing and Patient connected to nasal cannula oxygen  Post-op Pain: moderate  Post-op Assessment: Post-op Vital signs reviewed, Patient's Cardiovascular Status Stable, Respiratory Function Stable, Patent Airway and No signs of Nausea or vomiting  Post-op Vital Signs: Reviewed and stable  Complications: No apparent anesthesia complications

## 2011-08-11 NOTE — H&P (View-Only) (Signed)
Patient ID: [name], male   DOB: [birth year], 46 y.o.   MRN: [id number]  Chief Complaint  Patient presents with  . Other    Eval of gallbladder with stones    HPI [name] is a 46 y.o. male.  This patient was referred by Dr. Yetta Barre for evaluation of symptomatic cholelithiasis. He has had an ultrasound which documents gallstones without any evidence of acute cholecystitis. His LFTs are normal. He has had several "attacks" which started the beginning of December and were present approximately every 2 days and he states were brought in by eating fatty foods. He states that chicken wings especially where stimulating for the pain. He describes outer quadrant pain which radiates the rest of the stomach and lasted approximately one to 3 hours. He would walk around pacing and could not get comfortable and the pain would go away as fast as it came. Could not get any relief. He had some nausea but no vomiting. He states his bowels are okay and denies any blood in the stools. He denies any reflux or heartburn. HPI  Past Medical History  Diagnosis Date  . Hyperlipidemia   . HTN (hypertension)   . Hyperglycemia   . Gallstones   . Arthritis   . Abdominal pain     Past Surgical History  Procedure Date  . Repair knee ligament     Left  . Back cyst excision     x2  . Tennis elbow surgery, nerve entrapment     x2  . Shoulder surgery, reconstruction of joint     Left x 3, Last 02/2007  . Joint replacement 01/26/2011    right knee    Family History  Problem Relation Age of Onset  . Sarcoidosis Mother   . Colon cancer Neg Hx   . Cancer Neg Hx   . Heart disease Neg Hx   . Kidney disease Neg Hx     Social History History  Substance Use Topics  . Smoking status: Never Smoker   . Smokeless tobacco: Never Used  . Alcohol Use: No    No Known Allergies  Current Outpatient Prescriptions  Medication Sig Dispense Refill  . amitriptyline (ELAVIL) 25 MG tablet Take 1 tablet (25 mg total) by  mouth at bedtime.  30 tablet  3  . ANDROGEL PUMP 20.25 MG/ACT (1.62%) GEL Place onto the skin Daily.      [name] azithromycin (ZITHROMAX) 250 MG tablet Take 2 tablets by mouth on day 1, followed by 1 tablet by mouth daily for 4 days.        . hyoscyamine (LEVBID) 0.375 MG 12 hr tablet Take 1 tablet (0.375 mg total) by mouth every 12 (twelve) hours as needed for cramping.  60 tablet  0  . meloxicam (MOBIC) 15 MG tablet Take 1 tablet (15 mg total) by mouth daily.  30 tablet  3  . meloxicam (MOBIC) 15 MG tablet TAKE 1 TABLET EVERY DAY  30 tablet  1  . olmesartan (BENICAR) 20 MG tablet Take 1 tablet (20 mg total) by mouth daily.  30 tablet  11  . oxyCODONE (OXY IR/ROXICODONE) 5 MG immediate release tablet Take 5 mg by mouth Twice daily as needed.      . rosuvastatin (CRESTOR) 10 MG tablet Take 1 tablet (10 mg total) by mouth daily.  84 tablet  0  . traMADol (ULTRAM) 50 MG tablet TAKE 2 TABLETS TWICE A DAY  100 tablet  1  . VIAGRA 100 MG tablet  Take 100 mg by mouth as needed.        Review of Systems Review of Systems All other review of systems negative or noncontributory except as stated in the HPI  Blood pressure 134/78, pulse 68, temperature 96.9 F (36.1 C), temperature source Temporal, resp. rate 18, height 6\' 2"  (1.88 m), weight 358 lb 14.4 oz (162.796 kg).  Physical Exam Physical Exam  Vitals reviewed. Constitutional: He is oriented to person, place, and time. He appears well-developed and well-nourished. No distress.  HENT:  Head: Normocephalic and atraumatic.  Mouth/Throat: No oropharyngeal exudate.  Eyes: Conjunctivae and EOM are normal. Pupils are equal, round, and reactive to light. Right eye exhibits no discharge. Left eye exhibits no discharge. No scleral icterus.  Neck: Normal range of motion. No tracheal deviation present.  Cardiovascular: Normal rate, regular rhythm and normal heart sounds.   Pulmonary/Chest: Effort normal and breath sounds normal. No stridor. No respiratory  distress. He has no wheezes. He has no rales. He exhibits no tenderness.  Abdominal: Soft. Bowel sounds are normal. He exhibits no distension and no mass. There is no tenderness. There is no rebound and no guarding.  Musculoskeletal: Normal range of motion. He exhibits no edema and no tenderness.  Neurological: He is alert and oriented to person, place, and time.  Skin: Skin is warm and dry. No rash noted. He is not diaphoretic. No erythema. No pallor.  Psychiatric: He has a normal mood and affect. His behavior is normal. Judgment and thought content normal.    Data Reviewed [place] and Labs  Assessment    Symptomatic cholelithiasis I do think that his symptoms are likely due to his gallstones. He has ultrasound confirming cholelithiasis but his LFTs are normal. Given his symptoms and the ultrasound results, I have recommended a cystectomy for possible treatment. We discussed the risks of the surgery including infection, bleeding, pain, scarring, persistent symptoms, diarrhea, and injury to bowel or bile ducts, and need for open surgery. Express understanding and desires to proceed with cholecystectomy.  Morbid obesity with hypertension and hyperlipidemia He is interested in weight loss surgery and we will set him up for our informational session to learn more about the procedures and if he is still interested and we would be happy to get him started in the process to weight loss surgery.  Plan    Plan for OR when available       Lodema Pilot DAVID 07/27/2011, 9:13 AM

## 2011-08-11 NOTE — Telephone Encounter (Signed)
Left message with f/u appt date & time, 08/27/11 w/Dr. Biagio Quint

## 2011-08-11 NOTE — Interval H&P Note (Signed)
History and Physical Interval Note:  08/11/2011 12:59 PM  [name]  has presented today for surgery, with the diagnosis of to remove gallbladder  The various methods of treatment have been discussed with the patient and family. After consideration of risks, benefits and other options for treatment, the patient has consented to  Procedure(s): LAPAROSCOPIC CHOLECYSTECTOMY WITH INTRAOPERATIVE CHOLANGIOGRAM as a surgical intervention .  The patients' history has been reviewed, patient examined, no change in status, stable for surgery.  I have reviewed the patients' chart and labs.  Questions were answered to the patient's satisfaction.  He denies any changes from prior exam.  The risks of infection, bleeding, pain, persistent symptoms, scarring, injury to bowel or bile ducts, retained stone, diarrhea, need for additional procedures, and need for open surgery discussed with the patient.     Lodema Pilot DAVID

## 2011-08-11 NOTE — Brief Op Note (Signed)
08/11/2011  3:14 PM  PATIENT:  [name]  46 y.o. male  PRE-OPERATIVE DIAGNOSIS:  Cholelithiasis  POST-OPERATIVE DIAGNOSIS:  Cholelithiasis  PROCEDURE:  Procedure(s): LAPAROSCOPIC CHOLECYSTECTOMY WITH INTRAOPERATIVE CHOLANGIOGRAM  SURGEON:  Surgeon(s): Rulon Abide, DO  PHYSICIAN ASSISTANT:   ASSISTANTS: streck   ANESTHESIA:   general  EBL:  Total I/O In: 1000 [I.V.:1000] Out: 100 [Blood:100]  BLOOD ADMINISTERED:none  DRAINS: none   LOCAL MEDICATIONS USED:  MARCAINE 20CC and LIDOCAINE 20CC  SPECIMEN:  Source of Specimen:  gallbladder  DISPOSITION OF SPECIMEN:  PATHOLOGY  COUNTS:  YES  TOURNIQUET:  * No tourniquets in log *  DICTATION: .Other Dictation: Dictation Number [id number]  PLAN OF CARE: Discharge to home after PACU  PATIENT DISPOSITION:  PACU - hemodynamically stable.   Delay start of Pharmacological VTE agent (>24hrs) due to surgical blood loss or risk of bleeding:  {YES/NO/NOT APPLICABLE:20182

## 2011-08-11 NOTE — Preoperative (Signed)
Beta Blockers   Reason not to administer Beta Blockers:Not Applicable 

## 2011-08-11 NOTE — Progress Notes (Signed)
Pt. Is requesting to stay overnight for pain control. Dr. Biagio Quint aware. Waiting for admission orders.

## 2011-08-12 ENCOUNTER — Encounter (HOSPITAL_COMMUNITY): Payer: Self-pay | Admitting: *Deleted

## 2011-08-12 NOTE — Progress Notes (Signed)
UR of chart completed.  

## 2011-08-12 NOTE — Progress Notes (Signed)
1 Day Post-Op  Subjective: Feels much better.  Pain controlled.  No nausea and tolerating regular diet.  Objective: Vital signs in last 24 hours: Temp:  [97.9 F (36.6 C)-98.9 F (37.2 C)] 98.4 F (36.9 C) (01/31 0615) Pulse Rate:  [82-114] 94  (01/31 0615) Resp:  [18-22] 20  (01/31 0615) BP: (102-159)/(66-79) 108/68 mmHg (01/31 0615) SpO2:  [92 %-97 %] 96 % (01/31 0615) FiO2 (%):  [2 %] 2 % (01/30 1815) Weight:  [358 lb 14.4 oz (162.796 kg)] 358 lb 14.4 oz (162.796 kg) (01/30 2038) Last BM Date: 08/10/11  Intake/Output from previous day: 01/30 0701 - 01/31 0700 In: 2725 [I.V.:2725] Out: 600 [Urine:500; Blood:100] Intake/Output this shift:    General appearance: alert, cooperative and no distress Resp: nonlabored Cardio: normal rate, regular rhythm GI: soft, minimal right sided tenderness, nondistended, wounds okay.  Lab Results:   Basename 08/11/11 1200  WBC 7.4  HGB 13.2  HCT 39.7  PLT 193   BMET No results found for this basename: NA:2,K:2,CL:2,CO2:2,GLUCOSE:2,BUN:2,CREATININE:2,CALCIUM:2 in the last 72 hours PT/INR No results found for this basename: LABPROT:2,INR:2 in the last 72 hours ABG No results found for this basename: PHART:2,PCO2:2,PO2:2,HCO3:2 in the last 72 hours  Studies/Results: Dg Cholangiogram Operative  08/11/2011  *RADIOLOGY REPORT*  Clinical Data: Laparoscopic cholecystectomy.  INTRAOPERATIVE CHOLANGIOGRAM  Technique: Fluoroscopic cinematic views of intraoperative cholangiogram.  Comparison:  07/07/2011.  Findings: Intraoperative cholangiogram demonstrates no filling defects in the common bile duct or cystic duct.  Filling of intrahepatic biliary radicles is normal.  Normal spill of contrast to the duodenum.  IMPRESSION: Normal intraoperative cholangiogram.  Original Report Authenticated By: Andreas Newport, M.D.    Anti-infectives: Anti-infectives     Start     Dose/Rate Route Frequency Ordered Stop   08/10/11 1545   ceFAZolin (ANCEF) IVPB 2  g/50 mL premix        2 g 100 mL/hr over 30 Minutes Intravenous 60 min pre-op 08/10/11 1538 08/11/11 1330          Assessment/Plan: s/p Procedure(s): LAPAROSCOPIC CHOLECYSTECTOMY WITH INTRAOPERATIVE CHOLANGIOGRAM Discharge  LOS: 1 day    Lodema Pilot DAVID 08/12/2011

## 2011-08-13 ENCOUNTER — Encounter (HOSPITAL_COMMUNITY): Payer: Self-pay | Admitting: General Surgery

## 2011-08-27 ENCOUNTER — Ambulatory Visit (INDEPENDENT_AMBULATORY_CARE_PROVIDER_SITE_OTHER): Payer: BC Managed Care – PPO | Admitting: General Surgery

## 2011-08-27 ENCOUNTER — Encounter (INDEPENDENT_AMBULATORY_CARE_PROVIDER_SITE_OTHER): Payer: Self-pay

## 2011-08-27 VITALS — BP 130/78 | HR 72 | Temp 97.4°F | Resp 16 | Ht 74.0 in | Wt 360.0 lb

## 2011-08-27 DIAGNOSIS — Z4889 Encounter for other specified surgical aftercare: Secondary | ICD-10-CM

## 2011-08-27 DIAGNOSIS — Z5189 Encounter for other specified aftercare: Secondary | ICD-10-CM

## 2011-08-27 NOTE — Progress Notes (Signed)
Subjective:     Patient ID: [name], male   DOB: 1966-05-03, 46 y.o.   MRN: [id number]  HPI Patient follows up 2 weeks status post a cholecystectomy. He states that he feels much better and has not had any more of the "attacks". He has had one or 2 episodes of nausea after eating but he says that he feels much better. Just has some residual discomfort around his umbilicus but again this is improving as well. He does have some looser stools. He also states that he had a brother who had colon cancer at age 78 and was asking about possible colonoscopy.  Review of Systems     Objective:   Physical Exam His incision healing well without sign of infection there is no evidence of hernia.    Assessment:     Status post arthroscopic cholecystectomy-doing well He feels much improved overall although he still has a few episodes of nausea postoperatively. This may be due to pain medication or 2 concurrent problem but he states that he feels much better.    Plan:     I placed a gastroenterology referral for possible colonoscopy this he says that his half brother had colon cancer at age 46 and he is approaching age 47. Otherwise he is doing well and he can follow up with me on a p.r.n. basis

## 2011-09-07 ENCOUNTER — Telehealth (INDEPENDENT_AMBULATORY_CARE_PROVIDER_SITE_OTHER): Payer: Self-pay | Admitting: General Surgery

## 2011-09-07 NOTE — Telephone Encounter (Signed)
Patient calls stating his umbilical incision has opened a small amount. No drainage. Minimal bleeding that has resolved. Advised him to keep clean and dry and cover with gauze. I offered appt for tomorrow for a wound check with Dr Biagio Quint. Patient refused appt due to work. First available appt after 2:30pm was 09/20/11. Appt made. Patient instructed to call if any drainage, redness or fevers.

## 2011-09-09 ENCOUNTER — Ambulatory Visit: Payer: Self-pay | Admitting: Internal Medicine

## 2011-09-15 ENCOUNTER — Other Ambulatory Visit: Payer: Self-pay | Admitting: Gastroenterology

## 2011-09-20 ENCOUNTER — Encounter (INDEPENDENT_AMBULATORY_CARE_PROVIDER_SITE_OTHER): Payer: BC Managed Care – PPO | Admitting: General Surgery

## 2011-09-21 ENCOUNTER — Ambulatory Visit (INDEPENDENT_AMBULATORY_CARE_PROVIDER_SITE_OTHER): Payer: BC Managed Care – PPO | Admitting: Internal Medicine

## 2011-09-21 ENCOUNTER — Encounter: Payer: Self-pay | Admitting: Internal Medicine

## 2011-09-21 VITALS — BP 106/64 | HR 88 | Temp 97.7°F | Resp 16 | Wt 363.1 lb

## 2011-09-21 DIAGNOSIS — IMO0002 Reserved for concepts with insufficient information to code with codable children: Secondary | ICD-10-CM

## 2011-09-21 DIAGNOSIS — G5710 Meralgia paresthetica, unspecified lower limb: Secondary | ICD-10-CM

## 2011-09-21 DIAGNOSIS — K8020 Calculus of gallbladder without cholecystitis without obstruction: Secondary | ICD-10-CM

## 2011-09-21 DIAGNOSIS — IMO0001 Reserved for inherently not codable concepts without codable children: Secondary | ICD-10-CM

## 2011-09-21 DIAGNOSIS — M179 Osteoarthritis of knee, unspecified: Secondary | ICD-10-CM

## 2011-09-21 DIAGNOSIS — M1710 Unilateral primary osteoarthritis, unspecified knee: Secondary | ICD-10-CM

## 2011-09-21 DIAGNOSIS — E785 Hyperlipidemia, unspecified: Secondary | ICD-10-CM

## 2011-09-21 DIAGNOSIS — E669 Obesity, unspecified: Secondary | ICD-10-CM

## 2011-09-21 DIAGNOSIS — I10 Essential (primary) hypertension: Secondary | ICD-10-CM

## 2011-09-22 ENCOUNTER — Other Ambulatory Visit: Payer: Self-pay | Admitting: Sports Medicine

## 2011-09-22 ENCOUNTER — Other Ambulatory Visit: Payer: Self-pay | Admitting: *Deleted

## 2011-09-22 NOTE — Progress Notes (Signed)
Refilled pt's meloxicam- called him and asked him to schedule follow up.

## 2011-09-23 DIAGNOSIS — G5710 Meralgia paresthetica, unspecified lower limb: Secondary | ICD-10-CM | POA: Insufficient documentation

## 2011-09-23 NOTE — Assessment & Plan Note (Signed)
BP Readings from Last 3 Encounters:  09/21/11 106/64  08/27/11 130/78  08/12/11 108/68   BP is running low but patient is asymptomatic - this while on Benicar. Benefit of tight control and renoprotective effect of RAS drugs discussed.  Plan - continue present medication but if he gets symptomatic will switch to low dose ACE-I

## 2011-09-23 NOTE — Progress Notes (Signed)
Subjective:    Patient ID: [name], male    DOB: 03-17-1966, 46 y.o.   MRN: [id number]  HPI [name] presents for medical follow-up. CC: discomfort and paresthesia in the right leg predominantly at the lateral aspect of the thigh but also involving the top of the thigh with some radiation below the knee. This start soon after starting on Crestor, Dec 27th as ordered by Dr. Yetta Barre due to a LDL cholesterol of 115. Crestor was stopped but the discomfort has continued. He denies any strain, injury, or other precipitating event.  He was informed by Dr. Yetta Barre at the December visit that he was a diabetic. Chart reviewed:   11/07 5/08 12/08 3/09 12/12                                                                                                                                                A1C     11.3       6.0         5.8     6.0        6.7 He had brought his blood sugar down with diet and an 80 lb weight loss. He thought the diabetes was cured. He now realizes after instruction that this is a chronic disease but that life-style management is possible. He is also informed that the goal LDL due to the increased risk of cardiovascular disease in diabetics is 100 or less and that is why crestor was started. The goal for blood pressure control is 120/80 which is why Benicar was started in addition to the renal protection provided by the drug class in diabetics.  Obesity as a primary health threat was discussed in the context of the metabolic syndrome: a synergy of increased risk due to obesity, diabetes, hypertension and hyperlipidemia.  Past Medical History  Diagnosis Date  . Hyperlipidemia   . HTN (hypertension)   . Hyperglycemia   . Gallstones   . Arthritis   . Abdominal pain    Past Surgical History  Procedure Date  . Repair knee ligament     Left  . Back cyst excision     x2  . Tennis elbow surgery, nerve entrapment     x2  . Shoulder surgery, reconstruction of joint     Left x 3, Last  02/2007  . Joint replacement 01/26/2011    right knee  . Total knee arthroplasty     ON THE RIGHT KNEE  . Arthroscopies     2 ON R  KNEE,  AND 2  ON  L  KNEE  . Right foot     BONES STRAIGHTENED OUT   2006  . Cholecystectomy 08/11/2011    Procedure: LAPAROSCOPIC CHOLECYSTECTOMY WITH INTRAOPERATIVE CHOLANGIOGRAM;  Surgeon: Rulon Abide, DO;  Location: [facility] OR;  Service: General;  Laterality: N/A;  Laparoscopic  cholecystectomy with cholangiogram.   Family History  Problem Relation Age of Onset  . Sarcoidosis Mother   . Colon cancer Neg Hx   . Cancer Neg Hx   . Heart disease Neg Hx   . Kidney disease Neg Hx    History   Social History  . Marital Status: Married    Spouse Name: N/A    Number of Children: N/A  . Years of Education: N/A   Occupational History  . [employer]    Social History Main Topics  . Smoking status: Never Smoker   . Smokeless tobacco: Never Used  . Alcohol Use: No  . Drug Use: No  . Sexually Active: Yes    Birth Control/ Protection: Condom   Other Topics Concern  . 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 Has her own placeMonogamous relationships       Review of Systems System review is negative for any constitutional, cardiac, pulmonary, GI or neuro symptoms or complaints other than as described in the HPI.     Objective:   Physical Exam Filed Vitals:   09/21/11 1516  BP: 106/64  Pulse: 88  Temp: 97.7 F (36.5 C)  Resp: 16   Wt Readings from Last 3 Encounters:  09/21/11 363 lb 2 oz (164.712 kg)  08/27/11 360 lb (163.295 kg)  08/11/11 358 lb 14.4 oz (162.796 kg)   Gen'l - an obese AA man in no distress HEENT - C&S clear PERRLA, EOMI Cor - RRR Pulm - normal respirations. Neuro - alert and oriented.Normal ROM right LE/thigh. Area of paresthesia right lateral thigh. Normal reflexes, normal  sensation to deep vibratory stimulus, light touch and pinprick in the foot. Normal strength.       Assessment & Plan:  (greater than 50% of  45 minute visit spent on education and counseling)

## 2011-09-23 NOTE — Assessment & Plan Note (Signed)
Classic presentation of paresthesia at the lateral thigh. His exam is negative for other causes: hip disease, back disease, diabetic neuropathy. His body habitus puts him at risk for this nerve compression.  Plan - patient educated as to diagnosis           Weight management - best treatment

## 2011-09-23 NOTE — Assessment & Plan Note (Signed)
S/p lap chole with good recovery

## 2011-09-23 NOTE — Assessment & Plan Note (Signed)
Lab Results  Component Value Date   CHOL 166 07/01/2011   HDL 37.90* 07/01/2011   LDLCALC 115* 07/01/2011   TRIG 64.0 07/01/2011   CHOLHDL 4 07/01/2011  Previous LDL 99  Stopped crestor due to paresthesia right leg. Educated as to the generalized nature of adverse reaction "statins."  Plan - attempt at diet management           Repeat lipid panel in 3 months

## 2011-09-23 NOTE — Assessment & Plan Note (Signed)
Reviewed the importance of weight control, especially in light of his metabolic diseases.  Plan: weight management: smart food choices - no sugar, low carb/complex carbs/low fat foods; PORTION SIZE CONTROL - use of his hand as a guide, no seconds; regular exercise - recommended water based exercise as least stressful on his knees and other joints.           Target weight - 250 lbs          Goal - to loose 2 lbs a month - a 5 year project.

## 2011-09-23 NOTE — Assessment & Plan Note (Signed)
Has on-going pain in both knees, left great than right. He is limited in his activities due to pain and instability of the left knee. He is committed to left TKR

## 2011-09-23 NOTE — Assessment & Plan Note (Signed)
Reviewed the mechanism of disease including the chronic nature of deranged glucose metabolism. Reviewed the long term risks of end organ damage. Discussed treatment options: life-style management; oral medications; insulins. Discussed the importance of total risk reduction, i.e. Control of weight, cholesterol and blood pressure.  Plan - diet management since his A1C is below 7%           Follow-up lab in 3 months

## 2011-10-04 ENCOUNTER — Telehealth: Payer: Self-pay | Admitting: *Deleted

## 2011-10-04 DIAGNOSIS — M79606 Pain in leg, unspecified: Secondary | ICD-10-CM

## 2011-10-04 DIAGNOSIS — G5710 Meralgia paresthetica, unspecified lower limb: Secondary | ICD-10-CM

## 2011-10-04 NOTE — Telephone Encounter (Signed)
Request for referral to specialist for continued right leg pain.

## 2011-10-04 NOTE — Telephone Encounter (Signed)
Refer to Dr. Farris Has, sport medicine at [facility].

## 2011-10-05 NOTE — Telephone Encounter (Signed)
Patient informed. 

## 2011-10-11 ENCOUNTER — Other Ambulatory Visit: Payer: Self-pay | Admitting: *Deleted

## 2011-10-11 DIAGNOSIS — IMO0001 Reserved for inherently not codable concepts without codable children: Secondary | ICD-10-CM

## 2011-10-11 DIAGNOSIS — I10 Essential (primary) hypertension: Secondary | ICD-10-CM

## 2011-10-11 MED ORDER — OLMESARTAN MEDOXOMIL 20 MG PO TABS: 20.0000 mg | ORAL_TABLET | Freq: Every day | ORAL | Status: DC

## 2011-10-11 NOTE — Progress Notes (Signed)
90-day supply per patient request/SLS

## 2011-10-20 LAB — HM DIABETES EYE EXAM

## 2011-10-29 ENCOUNTER — Encounter: Payer: Self-pay | Admitting: Internal Medicine

## 2011-10-30 ENCOUNTER — Other Ambulatory Visit: Payer: Self-pay | Admitting: Sports Medicine

## 2011-11-02 ENCOUNTER — Other Ambulatory Visit: Payer: Self-pay | Admitting: *Deleted

## 2011-11-03 MED ORDER — TRAMADOL HCL 50 MG PO TABS: 50.0000 mg | ORAL_TABLET | Freq: Two times a day (BID) | ORAL | Status: DC | PRN

## 2011-11-17 ENCOUNTER — Ambulatory Visit (INDEPENDENT_AMBULATORY_CARE_PROVIDER_SITE_OTHER): Payer: BC Managed Care – PPO | Admitting: Sports Medicine

## 2011-11-17 VITALS — BP 129/83

## 2011-11-17 DIAGNOSIS — M179 Osteoarthritis of knee, unspecified: Secondary | ICD-10-CM

## 2011-11-17 DIAGNOSIS — Z96651 Presence of right artificial knee joint: Secondary | ICD-10-CM

## 2011-11-17 DIAGNOSIS — Z96659 Presence of unspecified artificial knee joint: Secondary | ICD-10-CM

## 2011-11-17 DIAGNOSIS — IMO0002 Reserved for concepts with insufficient information to code with codable children: Secondary | ICD-10-CM

## 2011-11-17 DIAGNOSIS — M1710 Unilateral primary osteoarthritis, unspecified knee: Secondary | ICD-10-CM

## 2011-11-17 NOTE — Progress Notes (Signed)
  Subjective:    Patient ID: [name], male    DOB: 06/22/1966, 46 y.o.   MRN: [id number]  HPI  Pt presents to clinic to have military disability paperwork completed. Scheduled to have lt knee replaced 06/2012. Had rt knee replaced 7/12. Has lt knee pain everyday, wakes him up at night, worse with standing.   Review of Systems     Objective:   Physical Exam  Rt knee flexion 110 deg, pain at 100 deg Extension -10 deg, pain at -5 deg  Lt knee flexion 100 deg, pain at 90 deg Extension -20 deg, pain at -20 deg         Assessment & Plan:

## 2011-11-17 NOTE — Assessment & Plan Note (Signed)
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

## 2011-11-21 ENCOUNTER — Other Ambulatory Visit: Payer: Self-pay | Admitting: Sports Medicine

## 2011-12-14 ENCOUNTER — Other Ambulatory Visit: Payer: Self-pay | Admitting: Otolaryngology

## 2011-12-14 DIAGNOSIS — R599 Enlarged lymph nodes, unspecified: Secondary | ICD-10-CM

## 2011-12-14 DIAGNOSIS — D490 Neoplasm of unspecified behavior of digestive system: Secondary | ICD-10-CM

## 2011-12-14 DIAGNOSIS — K118 Other diseases of salivary glands: Secondary | ICD-10-CM

## 2011-12-14 DIAGNOSIS — R220 Localized swelling, mass and lump, head: Secondary | ICD-10-CM

## 2011-12-17 ENCOUNTER — Ambulatory Visit
Admission: RE | Admit: 2011-12-17 | Discharge: 2011-12-17 | Disposition: A | Discharge status: Home / Self Care | Payer: BC Managed Care – PPO | Source: Ambulatory Visit | Attending: Otolaryngology | Admitting: Otolaryngology

## 2011-12-17 DIAGNOSIS — K118 Other diseases of salivary glands: Secondary | ICD-10-CM

## 2011-12-17 DIAGNOSIS — D490 Neoplasm of unspecified behavior of digestive system: Secondary | ICD-10-CM

## 2011-12-17 DIAGNOSIS — R599 Enlarged lymph nodes, unspecified: Secondary | ICD-10-CM

## 2011-12-20 ENCOUNTER — Telehealth: Payer: Self-pay | Admitting: Internal Medicine

## 2011-12-20 NOTE — Telephone Encounter (Signed)
Forward to Dr. Illene Regulus for review. 12-20-11 ym

## 2011-12-22 ENCOUNTER — Ambulatory Visit (INDEPENDENT_AMBULATORY_CARE_PROVIDER_SITE_OTHER): Payer: BC Managed Care – PPO | Admitting: General Surgery

## 2011-12-22 ENCOUNTER — Encounter (INDEPENDENT_AMBULATORY_CARE_PROVIDER_SITE_OTHER): Payer: Self-pay | Admitting: General Surgery

## 2011-12-22 ENCOUNTER — Other Ambulatory Visit: Payer: Self-pay | Admitting: Otolaryngology

## 2011-12-22 VITALS — BP 126/82 | HR 77 | Temp 97.6°F | Resp 16 | Ht 74.0 in | Wt 351.2 lb

## 2011-12-22 DIAGNOSIS — R599 Enlarged lymph nodes, unspecified: Secondary | ICD-10-CM

## 2011-12-22 DIAGNOSIS — D490 Neoplasm of unspecified behavior of digestive system: Secondary | ICD-10-CM

## 2011-12-22 DIAGNOSIS — R1013 Epigastric pain: Secondary | ICD-10-CM

## 2011-12-22 DIAGNOSIS — R220 Localized swelling, mass and lump, head: Secondary | ICD-10-CM

## 2011-12-22 MED ORDER — ESOMEPRAZOLE MAGNESIUM 40 MG PO CPDR: 40.0000 mg | DELAYED_RELEASE_CAPSULE | Freq: Every day | ORAL | Status: DC

## 2011-12-22 NOTE — Progress Notes (Signed)
Subjective:     Patient ID: [name], male   DOB: 05-Aug-1965, 46 y.o.   MRN: [id number]  HPI This patient comes in today for evaluation of epigastric abdominal pain. He is known to me for prior electroscopic cholecystectomy in January of this year for suspected abdominal pain due to cholelithiasis. He had an uneventful  recovery  and his pathology was benign. He says that he has occasional epigastric discomfort in the area of his epigastric trocar site which she describes as a "throbbing" and that comes and goes. It is not associated with eating and only last a few minutes or so and then spontaneously is relieved. He cannot think of any exacerbating or relieving factors. He denies any blood in his stools or melena and has no nausea or vomiting and denies any heartburn his bowels are normal which he says he moves approximately twice a day. Of note, he is taking Mobic for arthritis.  Review of Systems     Objective:   Physical Exam He is in no acute distress and nontoxic-appearing His abdomen is soft and nontender on exam today it is nondistended and his incisions are well-healed without signs of infection or hernia.    Assessment:     Abdominal pain in the epigastric region He has no evidence of any postoperative complication. Given the fact that he takes anti-inflammatories daily I think that there is a decent chance that this is due to gastritis or peptic ulcer. I have recommended and placed a referral for a gastroenterology evaluation. I've also prescribe him some Nexium to start taking and recommended that he stop his Mobic and see if the Nexium can prevent him from having any more of these episodes. He will followup with gastroenterologist as well and I think that this will give him a good clue as to whether this is reflux or gastritis or peptic ulcer disease. This may be ureteral bowel or other problem as well.    Plan:     Stop anti-inflammatories and start PPI daily Gastroenterology  evaluation and referral. Follow up with me in a few weeks to see how his symptoms have been on Nexium.

## 2011-12-24 ENCOUNTER — Other Ambulatory Visit: Payer: Self-pay

## 2011-12-27 ENCOUNTER — Ambulatory Visit
Admission: RE | Admit: 2011-12-27 | Discharge: 2011-12-27 | Disposition: A | Discharge status: Home / Self Care | Payer: BC Managed Care – PPO | Source: Ambulatory Visit | Attending: Otolaryngology | Admitting: Otolaryngology

## 2011-12-27 DIAGNOSIS — R599 Enlarged lymph nodes, unspecified: Secondary | ICD-10-CM

## 2011-12-27 DIAGNOSIS — R220 Localized swelling, mass and lump, head: Secondary | ICD-10-CM

## 2011-12-27 DIAGNOSIS — D490 Neoplasm of unspecified behavior of digestive system: Secondary | ICD-10-CM

## 2011-12-27 MED ORDER — IOHEXOL 300 MG/ML  SOLN
100.0000 mL | Freq: Once | INTRAMUSCULAR | Status: AC | PRN
  Administered 2011-12-27: 100 mL via INTRAVENOUS

## 2011-12-28 ENCOUNTER — Other Ambulatory Visit: Payer: Self-pay

## 2011-12-28 ENCOUNTER — Telehealth: Payer: Self-pay | Admitting: Internal Medicine

## 2011-12-28 NOTE — Telephone Encounter (Signed)
Received 4 pages from [name], sent to Dr. Debby Bud. 12/28/11/SD

## 2011-12-30 ENCOUNTER — Other Ambulatory Visit: Payer: Self-pay | Admitting: Radiology

## 2011-12-30 ENCOUNTER — Other Ambulatory Visit (HOSPITAL_COMMUNITY): Payer: Self-pay | Admitting: Otolaryngology

## 2011-12-30 DIAGNOSIS — D490 Neoplasm of unspecified behavior of digestive system: Secondary | ICD-10-CM

## 2011-12-30 DIAGNOSIS — K118 Other diseases of salivary glands: Secondary | ICD-10-CM

## 2011-12-30 DIAGNOSIS — R599 Enlarged lymph nodes, unspecified: Secondary | ICD-10-CM

## 2012-01-06 ENCOUNTER — Telehealth: Payer: Self-pay | Admitting: Internal Medicine

## 2012-01-06 ENCOUNTER — Other Ambulatory Visit (HOSPITAL_COMMUNITY): Payer: Self-pay

## 2012-01-06 NOTE — Telephone Encounter (Signed)
Forward to Dr. Illene Regulus for review on 01-06-12 ym

## 2012-01-28 ENCOUNTER — Encounter: Payer: Self-pay | Admitting: *Deleted

## 2012-01-31 ENCOUNTER — Ambulatory Visit (INDEPENDENT_AMBULATORY_CARE_PROVIDER_SITE_OTHER): Payer: BC Managed Care – PPO | Admitting: Internal Medicine

## 2012-01-31 ENCOUNTER — Encounter: Payer: Self-pay | Admitting: Internal Medicine

## 2012-01-31 VITALS — BP 106/68 | HR 72 | Ht 74.0 in | Wt 354.0 lb

## 2012-01-31 DIAGNOSIS — R1013 Epigastric pain: Secondary | ICD-10-CM

## 2012-01-31 DIAGNOSIS — Z791 Long term (current) use of non-steroidal anti-inflammatories (NSAID): Secondary | ICD-10-CM

## 2012-01-31 NOTE — Patient Instructions (Addendum)
Your physician has requested that you go to the basement for the following lab work before leaving today: H. Pylori stool test  Start the Nexium you have on hand after you collect the stool for our test.  At that time you may also re-start your meloxicam .   Follow - Up with [place] in two months.  Helicobacter Pylori Disease Often patients with stomach or duodenal ulcers not caused by irritants, are infected with a germ. The germ is called helicobacter pylori (H. pylori). This bacterium lives on the surface of stomach and small bowel. It can cause redness, soreness and ulcers. Ulcers are a hole in the lining of your stomach or small bowel. Blood and special breath tests can detect if you are infected with H. pylori. Tests can be done on samples taken from the stomach if you have endoscopy. After treatment you may have tests to prove you are cured. These can be done about a month after you finish the treatment or as your caregiver suggests. Most infections can be cured with a combination of antibiotics. Antibiotics are medications which kill germs such as H. pylori. Anti-ulcer medicines which block stomach acid secretion may also be used. Treatment will be continued for the time your caregiver suggests. Call your caregiver if you need more information about H. pylori. Call also if your symptoms get worse during or after treatment. You will not need a special diet. Avoid:  Smoking.   Aspirin.   Ibuprofen.   Other anti-inflammatory drugs.  Alcohol and spicy foods may also make your symptoms worse. The best advice is to avoid anything you find upsetting to your stomach. SEEK IMMEDIATE MEDICAL CARE IF:  You develop sharp, sudden, lasting stomach pain.   You have bloody vomit or vomit that looks like coffee grounds.   You have bloody or black stools.   You develop a lightheaded feeling, fainting, or become weak and sweaty.  Document Released: 06/28/2005 Document Revised: 06/17/2011 Document  Reviewed: 12/14/2006 [facility] Patient Information 2012 [place], [place].   Thank you for choosing me and [facility].

## 2012-01-31 NOTE — Progress Notes (Signed)
Subjective:    Patient ID: [name], male    DOB: 1965/11/26, 46 y.o.   MRN: [id number] Lodema Pilot, DO HPI This 46 yo AA man had a cholecystectomy for post-prandial upper abdominal pain and gallstones in January 2013. That pain is gone. He does have more frequent defecation since the cholecystectomy. He now has intermittent epigastric aching pain that lasts for minutes. It is sometimes crampy. He uses meloxicam (several years) for joint pains. Recently stopped it 1 week ago. Joints are worse. Dr. Biagio Quint prescribed Nexium but the patient has not started it. Denies heartburn, melena, other GI symptoms.  No Known Allergies Outpatient Prescriptions Prior to Visit  Medication Sig Dispense Refill  . ANDROGEL PUMP 20.25 MG/ACT (1.62%) GEL Place onto the skin Daily.      . clindamycin (CLEOCIN T) 1 % external solution       . esomeprazole (NEXIUM) 40 MG capsule Take 1 capsule (40 mg total) by mouth daily before breakfast.  30 capsule  3  . gabapentin (NEURONTIN) 300 MG capsule       . meloxicam (MOBIC) 15 MG tablet Take 1 tablet (15 mg total) by mouth daily.  30 tablet  3  . olmesartan (BENICAR) 20 MG tablet Take 1 tablet (20 mg total) by mouth daily.  90 tablet  3  . oxyCODONE (OXY IR/ROXICODONE) 5 MG immediate release tablet Take 5 mg by mouth Twice daily as needed. For pain      . traMADol (ULTRAM) 50 MG tablet Take 1 tablet (50 mg total) by mouth 2 (two) times daily as needed.  60 tablet  0  . VIAGRA 100 MG tablet Take 100 mg by mouth as needed. erectile dysfunction      . meloxicam (MOBIC) 15 MG tablet TAKE 1 TABLET EVERY DAY  30 tablet  1  . rosuvastatin (CRESTOR) 10 MG tablet Take 1 tablet (10 mg total) by mouth daily.  84 tablet  0   Past Medical History  Diagnosis Date  . Hyperlipidemia   . HTN (hypertension)   . Hyperglycemia   . Gallstones   . Arthritis   . DJD (degenerative joint disease)   . Obesity   . Diabetes mellitus    Past Surgical History  Procedure Date  . Repair  knee ligament     Left  . Back cyst excision     x2  . Tennis elbow surgery, nerve entrapment     x2 right  . Shoulder surgery, reconstruction of joint     Left x 3, Last 02/2007  . Joint replacement 01/26/2011    right knee  . Right foot     BONES STRAIGHTENED OUT   2006  . Cholecystectomy 08/11/2011    Procedure: LAPAROSCOPIC CHOLECYSTECTOMY WITH INTRAOPERATIVE CHOLANGIOGRAM;  Surgeon: Rulon Abide, DO;  Location: [facility] OR;  Service: General;  Laterality: N/A;  Laparoscopic cholecystectomy with cholangiogram.  . Knee arthroscopy     x 2 left  . Knee arthroscopy     x 2 right   History   Social History  . Marital Status: Married    Spouse Name: N/A    Number of Children: 1  . Years of Education: N/A   Occupational History  . [employer]   .     Social History Main Topics  . Smoking status: Never Smoker   . Smokeless tobacco: Never Used  . Alcohol Use: Yes     socially  . Drug Use: No  . 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 Has her own placeMonogamous relationships   Family History  Problem Relation Age of Onset  . Sarcoidosis Mother   . Colon cancer Brother 66  . Cancer Neg Hx   . Heart disease Neg Hx   . Kidney disease Neg Hx   . Ovarian cancer Maternal Grandmother     Review of Systems     Objective:   Physical Exam General:  Well-developed, well-nourished and in no acute distress Eyes:  anicteric. ENT:   Mouth and posterior pharynx free of lesions.  Neck:   supple w/o thyromegaly or mass.  Lungs: Clear to auscultation bilaterally. Heart:  S1S2, no rubs, murmurs, gallops. Abdomen:  soft, non-tender, no hepatosplenomegaly, hernia, or mass and BS+. Healed laparoscopy scars Lymph:  no cervical or supraclavicular adenopathy. Extremities:   no edema, right TKR scar Skin   no  rash. Neuro:  A&O x 3.  Psych:  appropriate mood and  Affect.   Data Reviewed: Op note, labs , Dr. Delice Lesch notes Lab Results  Component Value Date   WBC 7.4 08/11/2011   HGB 13.2 08/11/2011   HCT 39.7 08/11/2011   MCV 77.7* 08/11/2011   PLT 193 08/11/2011       Assessment & Plan:   1. Epigastric pain  Helicobacter pylori antigen det, stool  2. NSAID long-term use     1. Stool Ag H. Pylori - treat if + 2. Start Nexium after #1 turned in yspepsia and chance of gastrits/ulcer 3. May resume meloxicam after # 1 and 2 4. Offered EGD but has chosen conservative approach which is fine - may need long-term PPI vs. Eradication of H. Pylori alone to reduce chance of gastritris/ulcer 5. See me in 2 months   I appreciate the opportunity to care for this patient.  CC: BRIAN LAYTON, DO

## 2012-02-07 ENCOUNTER — Other Ambulatory Visit: Payer: Self-pay | Admitting: Internal Medicine

## 2012-02-07 ENCOUNTER — Other Ambulatory Visit: Payer: BC Managed Care – PPO

## 2012-02-07 DIAGNOSIS — R1013 Epigastric pain: Secondary | ICD-10-CM

## 2012-02-08 LAB — HELICOBACTER PYLORI  SPECIAL ANTIGEN: H. PYLORI Antigen: POSITIVE

## 2012-02-10 ENCOUNTER — Other Ambulatory Visit: Payer: Self-pay | Admitting: Internal Medicine

## 2012-02-10 DIAGNOSIS — A048 Other specified bacterial intestinal infections: Secondary | ICD-10-CM | POA: Insufficient documentation

## 2012-02-10 MED ORDER — BIS SUBCIT-METRONID-TETRACYC 140-125-125 MG PO CAPS: 3.0000 | ORAL_CAPSULE | Freq: Three times a day (TID) | ORAL | Status: DC

## 2012-02-10 NOTE — Progress Notes (Signed)
Quick Note:  Let him know that he has H. Pylori  I have prescribed Pylera.  Once he completes the Pylera it is ok to take meloxicam.  Needs to stay on Nexium until he sees me in follow-up ______

## 2012-04-05 ENCOUNTER — Other Ambulatory Visit: Payer: Self-pay | Admitting: Sports Medicine

## 2012-04-07 ENCOUNTER — Encounter: Payer: Self-pay | Admitting: Internal Medicine

## 2012-04-07 ENCOUNTER — Ambulatory Visit (INDEPENDENT_AMBULATORY_CARE_PROVIDER_SITE_OTHER): Payer: BC Managed Care – PPO | Admitting: Internal Medicine

## 2012-04-07 VITALS — BP 120/76 | HR 80 | Ht 73.5 in | Wt 362.0 lb

## 2012-04-07 DIAGNOSIS — A048 Other specified bacterial intestinal infections: Secondary | ICD-10-CM

## 2012-04-07 DIAGNOSIS — B9681 Helicobacter pylori [H. pylori] as the cause of diseases classified elsewhere: Secondary | ICD-10-CM

## 2012-04-07 DIAGNOSIS — K2970 Gastritis, unspecified, without bleeding: Secondary | ICD-10-CM

## 2012-04-07 NOTE — Progress Notes (Signed)
Patient ID: [name], male   DOB: Apr 26, 1966, 46 y.o.   MRN: [id number]  The patient returns after he was found that H. pylori positive on a stool antigen test. He took Pylera. He has had no more symptoms. He never did use Nexium. He does still take meloxicam.  Medications, allergies, past medical history, past surgical history, family history and social history are reviewed and updated in the EMR.  Assessment and plan:  1. Helicobacter pylori gastritis       1. Successful treatment it seems. We'll confirm eradication given his chronic meloxicam use I think that make sense. 2. urea breath test will be scheduled.  Cc: BRIAN LAYTON, DO

## 2012-04-07 NOTE — Patient Instructions (Addendum)
We will contact you about having a H. Pylori breath test done here in our office.  Thank you for choosing me and [facility].  Iva Boop, M.D., [facility]

## 2012-04-10 ENCOUNTER — Telehealth: Payer: Self-pay

## 2012-04-10 NOTE — Telephone Encounter (Signed)
Patient needs to check his work schedule.  He works nights and will have to see when he can get here in the am. He will contact me about a date once he can work it out with work.   Dr. Leone Payor in the event he can't work out his schedule,  is there an alternative test we can do? Stool antigen?

## 2012-04-10 NOTE — Telephone Encounter (Signed)
Message copied by Annett Fabian on Mon Apr 10, 2012  2:57 PM ------      Message from: [place], PATTI E      Created: Fri Apr 07, 2012  5:04 PM       Lavonna Rua could you please call patient Monday and set up a H. Pylori breath test.  He may be reached at [phone].  I did not put order in.  Patient has not been on antibiotics in past 30 days, however needs dental appointment and wants to do our test first because has to pre-medicate due to total knee replacement.  Thank you.

## 2012-04-10 NOTE — Telephone Encounter (Signed)
Stool Ag is an option but less accurate when testing for eradication.  This is not urgent and could wait 1-2 months or even longer if needed

## 2012-04-10 NOTE — Telephone Encounter (Signed)
Patient advised.  He will call me back when he can schedule

## 2012-06-08 IMAGING — US US ASPIRATION
2 series · 13 of 16 positions shown · non-contrast
Comparison: none

CLINICAL DATA: Palpable left parotid nodule, neck CT concerning for
bilateral parotid lesions.

[Series 1: us aspiration · 0.05mm/px · 11 of 31 slices shown (1 of 2)]
[im 1/31]
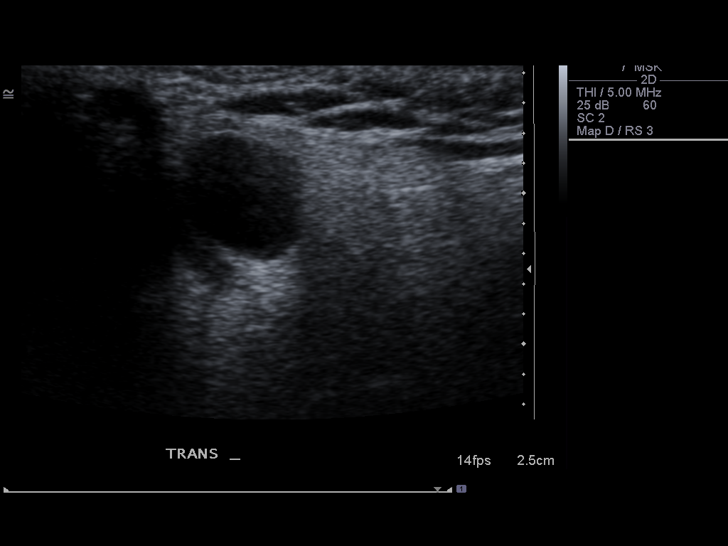
[im 3/31]
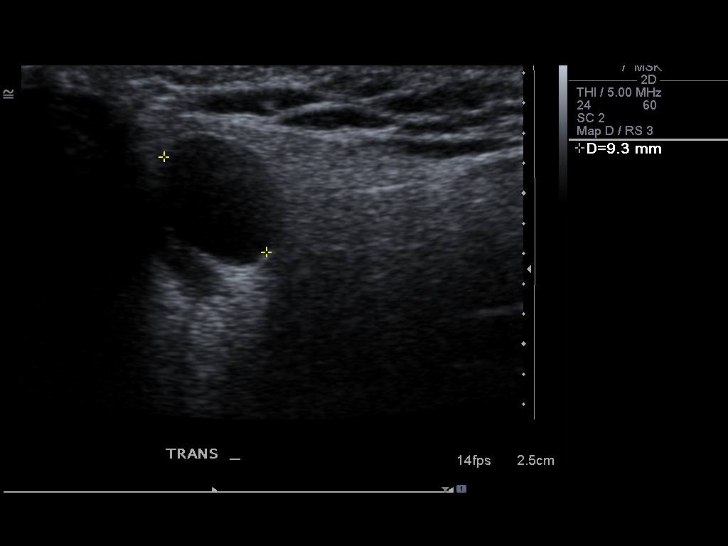
[im 7/31]
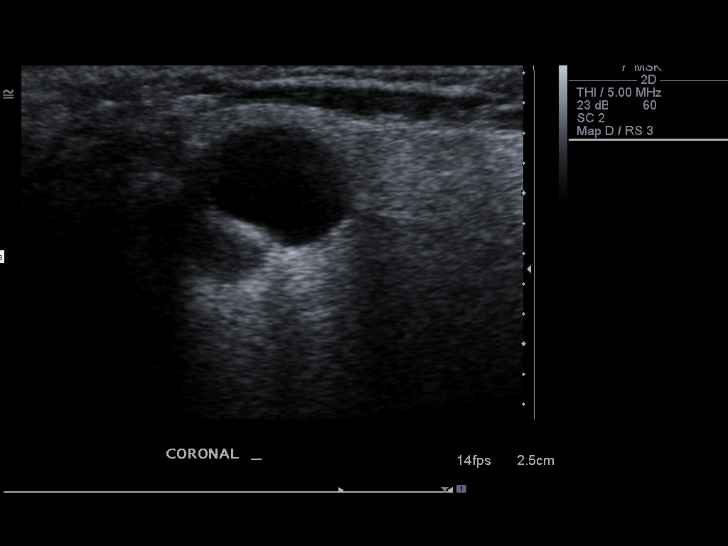
[im 10/31]
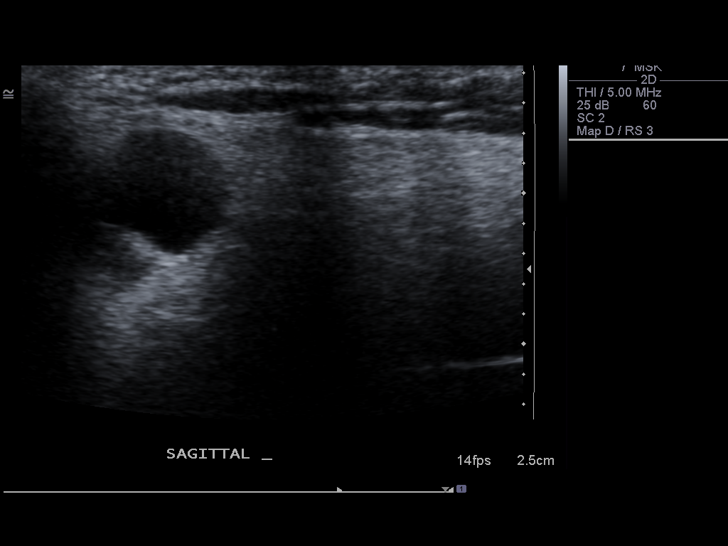
[im 12/31]
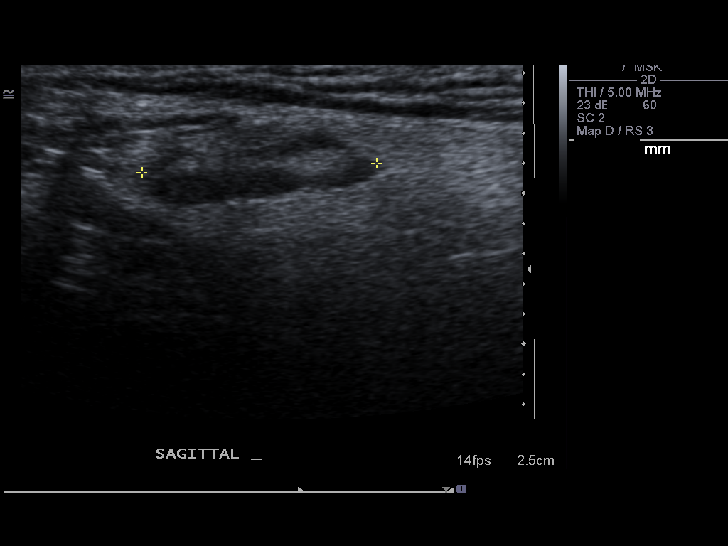
[im 14/31]
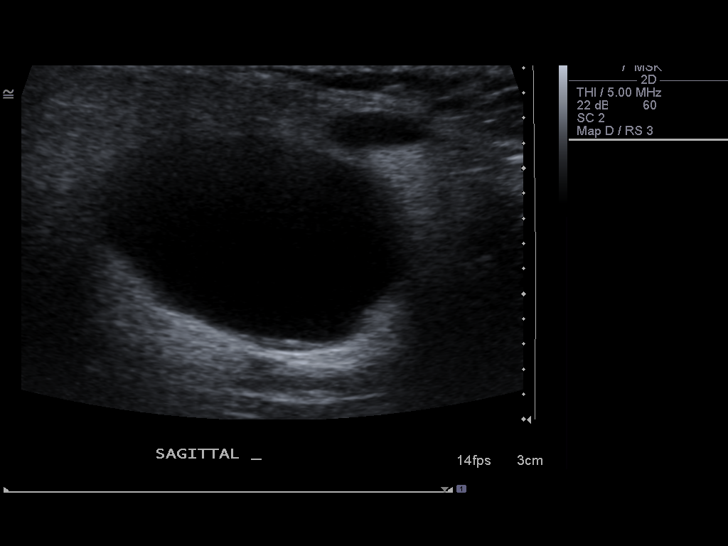
[im 19/31]
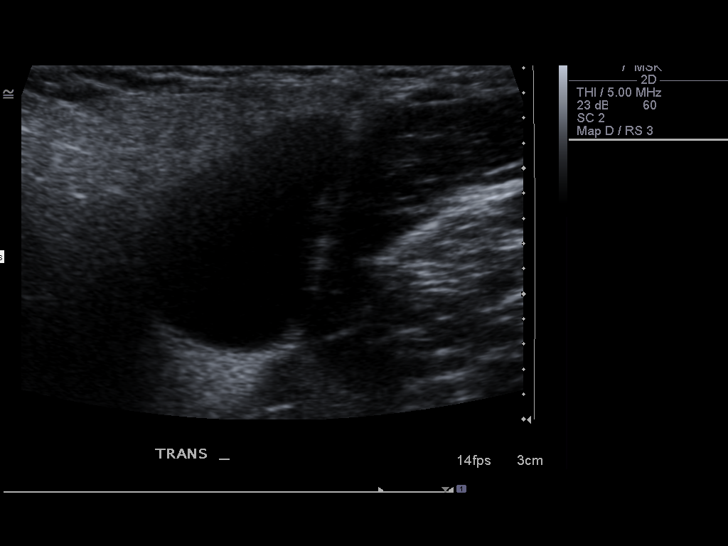
[im 21/31]
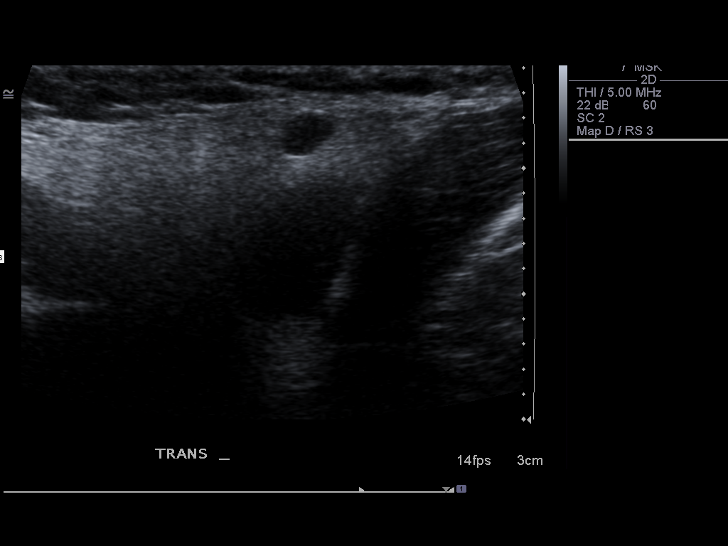
[im 24/31]
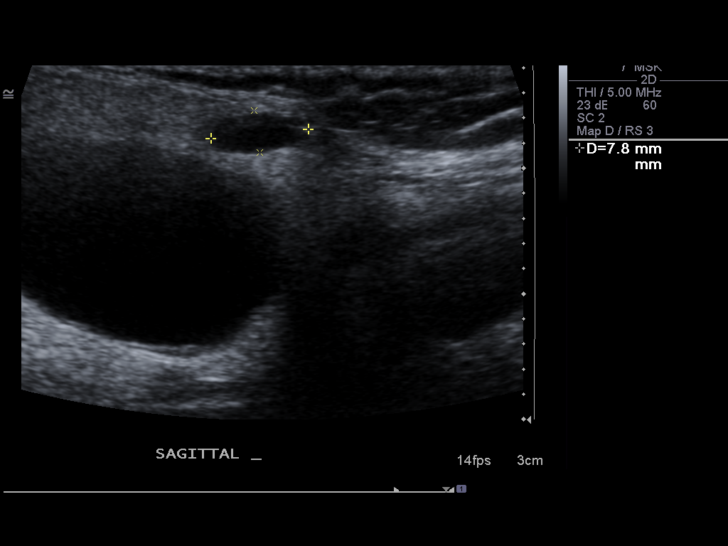
[im 26/31]
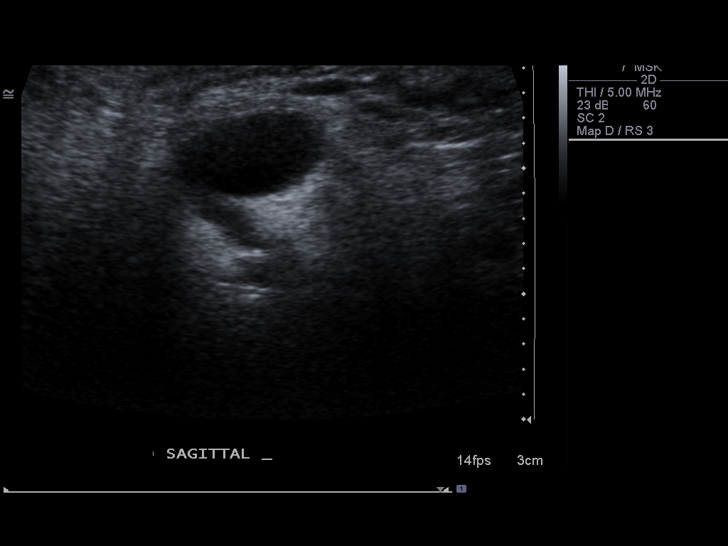
[im 28/31]
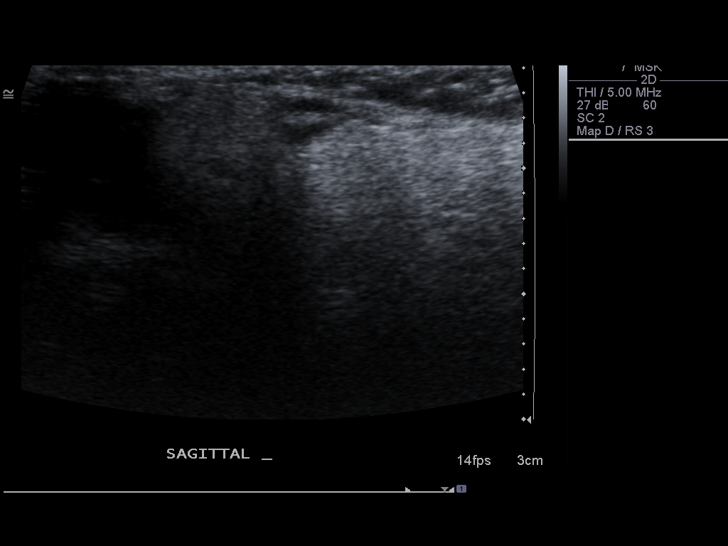

[Series 2: us aspiration · 0.07mm/px · 2 of 5 slices shown (2 of 2)]
[im 1/5]
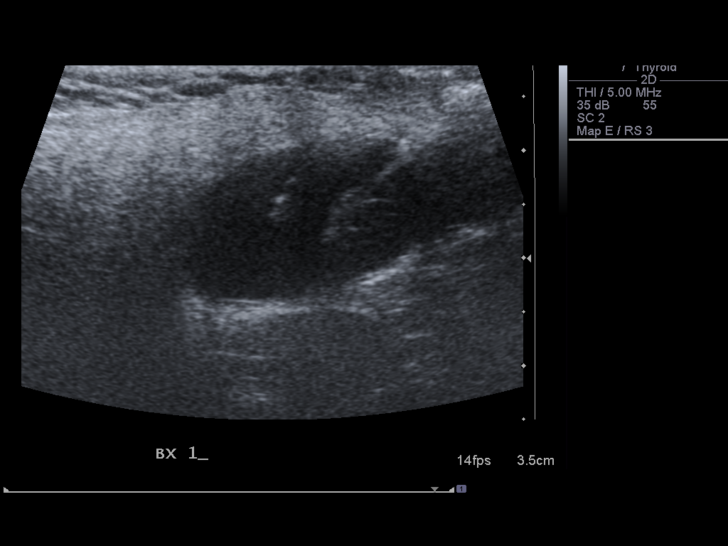
[im 5/5]
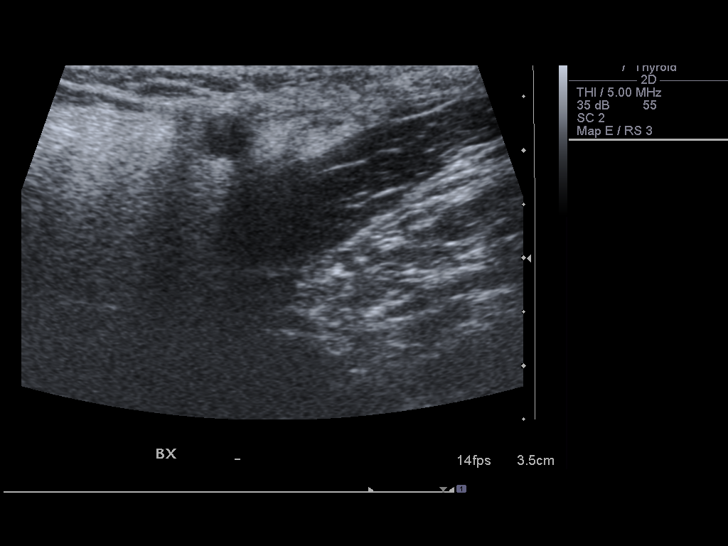

[13 of 16 positions shown; findings below may reference images not displayed]

ULTRASOUND LEFT PAROTID CYST NEEDLE ASPIRATION

Date:  05/14/2011 [DATE]

Radiologist:  Hediyu Badhaanee, M.D.

Medications:  1% lidocaine locally

Guidance:  Ultrasound

Complications:  No immediate

PROCEDURE/FINDINGS:

Informed consent was obtained from the patient following
explanation of the procedure, risks, benefits and alternatives.
The patient understands, agrees and consents for the procedure.
All questions were addressed.  A time out was performed.

Maximal barrier sterile technique utilized including caps, mask,
sterile gowns, sterile gloves, large sterile drape, hand hygiene,
and betadine

Preliminary ultrasound performed of the parotid glands bilaterally.
Small epithelial cysts are present in the parotid glands.  These
are well circumscribed, hypoechoic and demonstrate increased
through transmission.  The largest parotid cyst is in the left
parotid gland measuring 2.4 cm.  Incidentally, there are
intraparotid lymph nodes bilaterally which have benign features and
are not enlarged.  No suspicious solid irregular parotid mass
demonstrated.

Left parotid cyst aspiration:  Under sterile conditions and local
anesthesia, an 18 gauge needle was advanced under direct ultrasound
guidance into the 2.4 cm left parotid cyst.  Needle position
confirmed with ultrasound.  Syringe aspiration yielded 5 ml of thin
brown serosanguinous fluid.  Sample sent for cytology.  No
immediate complication.  The patient tolerated the procedure well.
IMPRESSION: Successful ultrasound left parotid cyst aspiration, sent for
cytology.

## 2012-06-23 ENCOUNTER — Other Ambulatory Visit: Payer: Self-pay | Admitting: Sports Medicine

## 2012-09-08 ENCOUNTER — Other Ambulatory Visit: Payer: Self-pay | Admitting: Sports Medicine

## 2012-10-02 ENCOUNTER — Ambulatory Visit (INDEPENDENT_AMBULATORY_CARE_PROVIDER_SITE_OTHER): Payer: BC Managed Care – PPO | Admitting: Internal Medicine

## 2012-10-02 ENCOUNTER — Other Ambulatory Visit (INDEPENDENT_AMBULATORY_CARE_PROVIDER_SITE_OTHER): Payer: BC Managed Care – PPO

## 2012-10-02 ENCOUNTER — Encounter: Payer: Self-pay | Admitting: Internal Medicine

## 2012-10-02 VITALS — BP 126/84 | HR 74 | Temp 98.2°F | Resp 16 | Ht 73.0 in | Wt 378.8 lb

## 2012-10-02 DIAGNOSIS — M1710 Unilateral primary osteoarthritis, unspecified knee: Secondary | ICD-10-CM

## 2012-10-02 DIAGNOSIS — E785 Hyperlipidemia, unspecified: Secondary | ICD-10-CM

## 2012-10-02 DIAGNOSIS — Z0000 Encounter for general adult medical examination without abnormal findings: Secondary | ICD-10-CM

## 2012-10-02 DIAGNOSIS — I10 Essential (primary) hypertension: Secondary | ICD-10-CM

## 2012-10-02 DIAGNOSIS — IMO0001 Reserved for inherently not codable concepts without codable children: Secondary | ICD-10-CM

## 2012-10-02 DIAGNOSIS — E669 Obesity, unspecified: Secondary | ICD-10-CM

## 2012-10-02 DIAGNOSIS — Z1211 Encounter for screening for malignant neoplasm of colon: Secondary | ICD-10-CM

## 2012-10-02 DIAGNOSIS — M179 Osteoarthritis of knee, unspecified: Secondary | ICD-10-CM

## 2012-10-02 LAB — COMPREHENSIVE METABOLIC PANEL
ALT: 48 U/L (ref 0–53)
AST: 27 U/L (ref 0–37)
Albumin: 4.4 g/dL (ref 3.5–5.2)
Alkaline Phosphatase: 51 U/L (ref 39–117)
Calcium: 9.4 mg/dL (ref 8.4–10.5)
Chloride: 101 mEq/L (ref 96–112)
Potassium: 4 mEq/L (ref 3.5–5.1)
Sodium: 136 mEq/L (ref 135–145)
Total Protein: 8 g/dL (ref 6.0–8.3)

## 2012-10-02 LAB — LIPID PANEL
LDL Cholesterol: 133 mg/dL — ABNORMAL HIGH (ref 0–99)
Total CHOL/HDL Ratio: 5
Triglycerides: 106 mg/dL (ref 0.0–149.0)

## 2012-10-02 LAB — HEPATIC FUNCTION PANEL
ALT: 48 U/L (ref 0–53)
Albumin: 4.4 g/dL (ref 3.5–5.2)
Alkaline Phosphatase: 51 U/L (ref 39–117)
Bilirubin, Direct: 0.1 mg/dL (ref 0.0–0.3)
Total Protein: 8 g/dL (ref 6.0–8.3)

## 2012-10-02 NOTE — Progress Notes (Signed)
  Subjective:    Patient ID: [name], male    DOB: 09/08/1965, 47 y.o.   MRN: [id number]  HPI [name] presents with concern for weight management: he is biking.   Review of Systems     Objective:   Physical Exam        Assessment & Plan:

## 2012-10-02 NOTE — Patient Instructions (Addendum)
1. Weight management: Diet management: smart food choices, PORTION SIZE CONTROL, regular exercise. Goal - to loose 1-2 lbs.month. Target weight - 250 lbs.  Plan - weight management as above  Keep a food diary for 5 days, use only days 3-5. This will give you a baseline to start from  Attend the Bariatric orientation program at [facility] - call [phone] to find out.   2. High Blood Pressure  - on Benicar.  126/ 84  3. History of diabetes - last A1C in '12 was 6.7%. Plan  Follow up lab - A1C  Continue a sugar free low carb diet.   4. Health maintenance - with a 1st degree relative with colon cancer need to have early colonoscopy. Referral filed.   Please sign up for MyChart

## 2012-10-03 NOTE — Assessment & Plan Note (Signed)
Weight management: Diet management: smart food choices, PORTION SIZE CONTROL, regular exercise. Goal - to loose 1-2 lbs.month. Target weight - 250 lbs.  Plan - weight management as above  Keep a food diary for 5 days, use only days 3-5. This will give you a baseline to start from  Attend the Bariatric orientation program at [facility] - call [phone] to find out.

## 2012-10-03 NOTE — Assessment & Plan Note (Signed)
A1C 7.8 % - much higher than last lab Dec '12  Plan Strict no sugar, low carb diet with weight loss  Repeat lab in 3 months  If not to A1C of 7% or less will restart medical therapy = metformin.

## 2012-10-03 NOTE — Assessment & Plan Note (Signed)
DJD knees with TKR does limit his exercise ability.  Plan - non weight bearing exercise: continue cycling, consider water based exercise, eliptical machine.

## 2012-10-03 NOTE — Assessment & Plan Note (Signed)
LDL is greater than goal for a diet controlled diabetic - should be 100 or less.  Plan Life-style management   Recheck lab in 6 months, if not at goal initiate medical therapy

## 2012-10-03 NOTE — Assessment & Plan Note (Signed)
Interval history w/o major illness but with marked weight gain. Limited physical exam is normal except for weight. Will be referred for colorectal cancer screening. Immunizations are up to date.  In summary - A very nice man who REALLY needs to address his obesity in light of multiple co-morbidities. Bariatric surgery is a reasonable venue to pursue. He will return for lab in 3 and 6 months with OV as indicated by lab results.

## 2012-10-03 NOTE — Assessment & Plan Note (Signed)
BP Readings from Last 3 Encounters:  10/02/12 126/84  04/07/12 120/76  01/31/12 106/68   Good control on present medications. Adivsed that a 10-20 % drop in weight may allow him to come off medication.

## 2012-10-04 ENCOUNTER — Other Ambulatory Visit: Payer: Self-pay | Admitting: Internal Medicine

## 2012-10-08 NOTE — Progress Notes (Signed)
Subjective:    Patient ID: [name], male    DOB: [birth year], 47 y.o.   MRN: [id number]  HPI [name] presents for general medical follow up. He is very concerned about his weight which despite some regular exercise, biking, has continued to increase. Other issues of concern include diabetes with last A1C 6.7% several years ago, hypertension for which he has continued to take medication and a question of timing for colonoscopy given that he has a 1st degree relative who was diagnosed with colon cancer at age 64.  He is current with his dentist, needs to see his eye doctor and he believes his immunizations are up to date.  Past Medical History  Diagnosis Date  . Hyperlipidemia   . HTN (hypertension)   . Hyperglycemia   . Gallstones   . Arthritis   . DJD (degenerative joint disease)   . Obesity   . Diabetes mellitus    Past Surgical History  Procedure Laterality Date  . Repair knee ligament      Left  . Back cyst excision      x2  . Tennis elbow surgery, nerve entrapment      x2 right  . Shoulder surgery, reconstruction of joint      Left x 3, Last 02/2007  . Joint replacement  01/26/2011    right knee  . Right foot      BONES STRAIGHTENED OUT   2006  . Cholecystectomy  08/11/2011    Procedure: LAPAROSCOPIC CHOLECYSTECTOMY WITH INTRAOPERATIVE CHOLANGIOGRAM;  Surgeon: Rulon Abide, DO;  Location: [facility] OR;  Service: General;  Laterality: N/A;  Laparoscopic cholecystectomy with cholangiogram.  . Knee arthroscopy      x 2 left  . Knee arthroscopy      x 2 right   Family History  Problem Relation Age of Onset  . Sarcoidosis Mother   . Colon cancer Brother 79  . Cancer Neg Hx   . Heart disease Neg Hx   . Kidney disease Neg Hx   . Ovarian cancer Maternal Grandmother    History   Social History  . Marital Status: Married    Spouse Name: N/A    Number of Children: 1  . Years of Education: N/A   Occupational History  . [employer]   .     Social History Main Topics   . Smoking status: Never Smoker   . Smokeless tobacco: Never Used  . Alcohol Use: Yes     Comment: socially  . Drug Use: No  . Sexually Active: Yes    Birth Control/ Protection: Condom   Other Topics Concern  . Not on file   Social History Narrative   ** Merged History Encounter **       Works - Training and development officer      Married 8 years - divorced 2004   Daughter 1988 - [employer] doing well. Has her own place   Monogamous relationships   Current Outpatient Prescriptions on File Prior to Visit  Medication Sig Dispense Refill  . VIAGRA 100 MG tablet Take 100 mg by mouth as needed. erectile dysfunction            Benicar 20 mg                                   Take 1 tab daily         Review of Systems Constitutional:  Negative for  fever, chills, activity change and unexpected weight change.  HEENT:  Negative for hearing loss, ear pain, congestion, neck stiffness and postnasal drip. Negative for sore throat or swallowing problems. Negative for dental complaints.   Eyes: Negative for vision loss or change in visual acuity.  Respiratory: Negative for chest tightness and wheezing. Negative for DOE.   Cardiovascular: Negative for chest pain or palpitations. No decreased exercise tolerance Gastrointestinal: No change in bowel habit. No bloating or gas. No reflux or indigestion Genitourinary: Negative for urgency, frequency, flank pain and difficulty urinating.  Musculoskeletal: Negative for myalgias, back pain, arthralgias and gait problem.  Neurological: Negative for dizziness, tremors, weakness and headaches.  Hematological: Negative for adenopathy.  Psychiatric/Behavioral: Negative for behavioral problems and dysphoric mood.       Objective:   Physical Exam Filed Vitals:   10/02/12 1434  BP: 126/84  Pulse: 74  Temp: 98.2 F (36.8 C)  Resp: 16   Wt Readings from Last 3 Encounters:  10/02/12 378 lb 12 oz (171.8 kg)  04/07/12 362 lb (164.202 kg)  01/31/12 354 lb (160.573 kg)    Gen'l - Athletic but overweight AA man in no distress HEENT - C&S clear, Oropharynx with native dentition in good repair, no oral lesions Neck - supple, w/o thyromegaly Nodes - negative Cor 2+ radial pulse, RRR Pulm - normal respirations Abd - no HSM, no guarding or rebound Genitalia - deferred Neuro - A&O x 3, CN II-XII normal, normal gait and station MSK - surgical changes left foot at 3rd toe Derm - clear  Lab Results  Component Value Date   WBC 7.4 08/11/2011   HGB 13.2 08/11/2011   HCT 39.7 08/11/2011   PLT 193 08/11/2011   GLUCOSE 121* 10/02/2012   CHOL 190 10/02/2012   TRIG 106.0 10/02/2012   HDL 35.40* 10/02/2012   LDLCALC 133* 10/02/2012        ALT 48 10/02/2012   AST 27 10/02/2012        NA 136 10/02/2012   K 4.0 10/02/2012   CL 101 10/02/2012   CREATININE 0.8 10/02/2012   BUN 12 10/02/2012   CO2 28 10/02/2012   TSH 1.32 07/01/2011   PSA 0.27 07/08/2011   INR 0.96 05/14/2011   HGBA1C 7.8* 10/02/2012            Assessment & Plan:

## 2012-10-08 NOTE — Addendum Note (Signed)
Addended by: Illene Regulus E on: 10/08/2012 11:07 AM   Modules accepted: Orders, Medications

## 2012-10-12 ENCOUNTER — Telehealth: Payer: Self-pay | Admitting: Internal Medicine

## 2012-10-12 NOTE — Telephone Encounter (Signed)
Patient has questions about his lab results from recent physical,

## 2012-10-12 NOTE — Telephone Encounter (Signed)
Pt was calling to get clarification about his A1C. I let him know this is abnormal that why he is coming back in 3 months for a recheck. He started asking about his cholesterol but his phone hung up.

## 2012-11-01 ENCOUNTER — Other Ambulatory Visit: Payer: Self-pay | Admitting: Sports Medicine

## 2012-12-13 ENCOUNTER — Telehealth: Payer: Self-pay | Admitting: Internal Medicine

## 2012-12-13 MED ORDER — ESOMEPRAZOLE MAGNESIUM 40 MG PO PACK: 40.0000 mg | PACK | Freq: Every day | ORAL | Status: DC

## 2012-12-13 NOTE — Addendum Note (Signed)
Addended by: Annett Fabian on: 12/13/2012 08:30 AM   Modules accepted: Orders

## 2012-12-13 NOTE — Telephone Encounter (Signed)
Patient reports epigastric pain, mostly at night.  He has been on Meloxicam for several months for his knee.  He has not been taking a PPI.  I have sent him in a rx for Nexium that was prescribed at his last office visit.  He will try for a few weeks and if still having pain he will call back for an appt with Dr. Leone Payor or an extender

## 2012-12-13 NOTE — Telephone Encounter (Signed)
Patient notified he has a sliding hiatal hernia according to endo report from 03/08/96.  He will call back for any additional questions or concerns

## 2012-12-13 NOTE — Telephone Encounter (Signed)
Below documentation is incorrect please disregard.  Wrong chart

## 2013-01-11 ENCOUNTER — Other Ambulatory Visit: Payer: Self-pay | Admitting: Otolaryngology

## 2013-01-11 ENCOUNTER — Telehealth: Payer: Self-pay | Admitting: Internal Medicine

## 2013-01-11 DIAGNOSIS — R599 Enlarged lymph nodes, unspecified: Secondary | ICD-10-CM

## 2013-01-11 DIAGNOSIS — R220 Localized swelling, mass and lump, head: Secondary | ICD-10-CM

## 2013-01-11 DIAGNOSIS — D490 Neoplasm of unspecified behavior of digestive system: Secondary | ICD-10-CM

## 2013-01-11 NOTE — Telephone Encounter (Signed)
rec'd records 3 pages from [facility] and Throat forward to Dr.Norins

## 2013-01-18 ENCOUNTER — Ambulatory Visit
Admission: RE | Admit: 2013-01-18 | Discharge: 2013-01-18 | Disposition: A | Discharge status: Home / Self Care | Payer: BC Managed Care – PPO | Source: Ambulatory Visit | Attending: Otolaryngology | Admitting: Otolaryngology

## 2013-01-18 DIAGNOSIS — R221 Localized swelling, mass and lump, neck: Secondary | ICD-10-CM

## 2013-01-18 DIAGNOSIS — R220 Localized swelling, mass and lump, head: Secondary | ICD-10-CM

## 2013-01-18 DIAGNOSIS — D490 Neoplasm of unspecified behavior of digestive system: Secondary | ICD-10-CM

## 2013-01-18 DIAGNOSIS — R599 Enlarged lymph nodes, unspecified: Secondary | ICD-10-CM

## 2013-01-22 ENCOUNTER — Telehealth: Payer: Self-pay | Admitting: Internal Medicine

## 2013-01-22 NOTE — Telephone Encounter (Signed)
Rec'd from [facility] forward 5 pages to Dr. Norins °

## 2013-02-26 ENCOUNTER — Other Ambulatory Visit: Payer: Self-pay

## 2013-02-26 MED ORDER — ESOMEPRAZOLE MAGNESIUM 40 MG PO PACK: 40.0000 mg | PACK | Freq: Every day | ORAL | Status: DC

## 2013-03-05 ENCOUNTER — Ambulatory Visit (INDEPENDENT_AMBULATORY_CARE_PROVIDER_SITE_OTHER): Payer: BC Managed Care – PPO | Admitting: Internal Medicine

## 2013-03-05 ENCOUNTER — Encounter: Payer: Self-pay | Admitting: Internal Medicine

## 2013-03-05 VITALS — BP 120/80 | HR 86 | Temp 98.3°F | Wt 375.0 lb

## 2013-03-05 DIAGNOSIS — I10 Essential (primary) hypertension: Secondary | ICD-10-CM

## 2013-03-05 DIAGNOSIS — IMO0001 Reserved for inherently not codable concepts without codable children: Secondary | ICD-10-CM

## 2013-03-05 DIAGNOSIS — E785 Hyperlipidemia, unspecified: Secondary | ICD-10-CM

## 2013-03-05 DIAGNOSIS — E669 Obesity, unspecified: Secondary | ICD-10-CM

## 2013-03-05 MED ORDER — LOVASTATIN 40 MG PO TABS: 40.0000 mg | ORAL_TABLET | Freq: Every day | ORAL | Status: DC

## 2013-03-05 NOTE — Patient Instructions (Addendum)
Good to see you.  The lab report is not that bad:  1. Diabetes - the A1C @ 7.1% is just about at goal of 7% or less Plan Continue on a sugar free, low carb diet  Exercise 3 times a week to get to a heart rate of 120 beats per minute for 30 minutes  2. Cholesterol - the goal for a diabetic is to have an LDL cholesterol of 100 or less, closer to 80 the better Plan Start lovastatin 40 mg once a day in the PM (after supper)  Return Sept 26th, Friday, for lab work at 5 PM. You may have a light breakfast and a very light lunch (salad, or soup; no fatty food).  3. Blood pressure - great control at 120/80 Plan Continue present medication  The only other abnormality on the lab report was the GGT, 5 points above normal. Not a problem.  Continue to work on weight loss: Diet management: smart food choices, PORTION SIZE CONTROL, regular exercise. Goal - to loose 1-2 lbs.month. Target weight - 300 lbs.    Fat and Cholesterol Control Diet Cholesterol is a wax-like substance. It comes from your liver and is found in certain foods. There is good (HDL) and bad (LDL) cholesterol. Too much cholesterol in your blood can affect your heart. Certain foods can lower or raise your cholesterol. Eat foods that are low in cholesterol. Saturated and trans fats are bad fats found in foods that will raise your cholesterol. Do not eat foods that are high in saturated and trans fats. FOODS HIGHER IN SATURATED AND TRANS FATS  Dairy products, such as whole milk, eggs, cheese, cream, and butter.  Fatty meats, such as hot dogs, sausage, and salami.  Fried foods.  Trans fats which are found in margarine and pre-made cookies, crackers, and baked goods.  Tropical oils, such as coconut and palm oils. Read package labels at the store. Do not buy products that use saturated or trans fats or hydrogenated oils. Find foods labeled:  Low-fat.  Low-saturated fat.  Trans-fat-free.  Low-cholesterol. FOODS LOWER IN  CHOLESTEROL   Fruit.  Vegetables.  Beans, peas, and lentils.  Fish.  Lean meat, such as chicken (without skin) or ground [place].  Grains, such as barley, rice, couscous, bulgur wheat, and pasta.  Heart-healthy tub margarine. PREPARING YOUR FOOD  Broil, bake, steam, or roast foods. Do not fry food.  Use non-stick cooking sprays.  Use lemon or herbs to flavor food instead of using butter or stick margarine.  Use nonfat yogurt, salsa, or low-fat dressings for salads. Document Released: 12/28/2011 Document Reviewed: 12/28/2011 [facility] Patient Information 2014 [place], [place].   Fat and Cholesterol Control Diet Cholesterol levels in your body are determined significantly by your diet. Cholesterol levels may also be related to heart disease. The following material helps to explain this relationship and discusses what you can do to help keep your heart healthy. Not all cholesterol is bad. Low-density lipoprotein (LDL) cholesterol is the "bad" cholesterol. It may cause fatty deposits to build up inside your arteries. High-density lipoprotein (HDL) cholesterol is "good." It helps to remove the "bad" LDL cholesterol from your blood. Cholesterol is a very important risk factor for heart disease. Other risk factors are high blood pressure, smoking, stress, heredity, and weight. The heart muscle gets its supply of blood through the coronary arteries. If your LDL cholesterol is high and your HDL cholesterol is low, you are at risk for having fatty deposits build up in your coronary  arteries. This leaves less room through which blood can flow. Without sufficient blood and oxygen, the heart muscle cannot function properly and you may feel chest pains (angina pectoris). When a coronary artery closes up entirely, a part of the heart muscle may die causing a heart attack (myocardial infarction). CHECKING CHOLESTEROL When your caregiver sends your blood to a lab to be examined for cholesterol, a  complete lipid (fat) profile may be done. With this test, the total amount of cholesterol and levels of LDL and HDL are determined. Triglycerides are a type of fat that circulates in the blood. They can also be used to determine heart disease risk. The list below describes what the numbers should be: Test: Total Cholesterol.  Less than 200 mg/dl. Test: LDL "bad cholesterol."  Less than 100 mg/dl.  Less than 70 mg/dl if you are at very high risk of a heart attack or sudden cardiac death. Test: HDL "good cholesterol."  Greater than 50 mg/dl for women.  Greater than 40 mg/dl for men. Test: Triglycerides.  Less than 150 mg/dl. CONTROLLING CHOLESTEROL WITH DIET Although exercise and lifestyle factors are important, your diet is key. That is because certain foods are known to raise cholesterol and others to lower it. The goal is to balance foods for their effect on cholesterol and more importantly, to replace saturated and trans fat with other types of fat, such as monounsaturated fat, polyunsaturated fat, and omega-3 fatty acids. On average, a person should consume no more than 15 to 17 g of saturated fat daily. Saturated and trans fats are considered "bad" fats, and they will raise LDL cholesterol. Saturated fats are primarily found in animal products such as meats, butter, and cream. However, that does not mean you need to give up all your favorite foods. Today, there are good tasting, low-fat, low-cholesterol substitutes for most of the things you like to eat. Choose low-fat or nonfat alternatives. Choose round or loin cuts of red meat. These types of cuts are lowest in fat and cholesterol. Chicken (without the skin), fish, veal, and ground [place] breast are great choices. Eliminate fatty meats, such as hot dogs and salami. Even shellfish have little or no saturated fat. Have a 3 oz (85 g) portion when you eat lean meat, poultry, or fish. Trans fats are also called "partially hydrogenated oils."  They are oils that have been scientifically manipulated so that they are solid at room temperature resulting in a longer shelf life and improved taste and texture of foods in which they are added. Trans fats are found in stick margarine, some tub margarines, cookies, crackers, and baked goods.  When baking and cooking, oils are a great substitute for butter. The monounsaturated oils are especially beneficial since it is believed they lower LDL and raise HDL. The oils you should avoid entirely are saturated tropical oils, such as coconut and palm.  Remember to eat a lot from food groups that are naturally free of saturated and trans fat, including fish, fruit, vegetables, beans, grains (barley, rice, couscous, bulgur wheat), and pasta (without cream sauces).  IDENTIFYING FOODS THAT LOWER CHOLESTEROL  Soluble fiber may lower your cholesterol. This type of fiber is found in fruits such as apples, vegetables such as broccoli, potatoes, and carrots, legumes such as beans, peas, and lentils, and grains such as barley. Foods fortified with plant sterols (phytosterol) may also lower cholesterol. You should eat at least 2 g per day of these foods for a cholesterol lowering effect.  Read package  labels to identify low-saturated fats, trans fat free, and low-fat foods at the supermarket. Select cheeses that have only 2 to 3 g saturated fat per ounce. Use a heart-healthy tub margarine that is free of trans fats or partially hydrogenated oil. When buying baked goods (cookies, crackers), avoid partially hydrogenated oils. Breads and muffins should be made from whole grains (whole-wheat or whole oat flour, instead of "flour" or "enriched flour"). Buy non-creamy canned soups with reduced salt and no added fats.  FOOD PREPARATION TECHNIQUES  Never deep-fry. If you must fry, either stir-fry, which uses very little fat, or use non-stick cooking sprays. When possible, broil, bake, or roast meats, and steam vegetables. Instead of  putting butter or margarine on vegetables, use lemon and herbs, applesauce, and cinnamon (for squash and sweet potatoes), nonfat yogurt, salsa, and low-fat dressings for salads.  LOW-SATURATED FAT / LOW-FAT FOOD SUBSTITUTES Meats / Saturated Fat (g)  Avoid: Steak, marbled (3 oz/85 g) / 11 g  Choose: Steak, lean (3 oz/85 g) / 4 g  Avoid: Hamburger (3 oz/85 g) / 7 g  Choose: Hamburger, lean (3 oz/85 g) / 5 g  Avoid: Ham (3 oz/85 g) / 6 g  Choose: Ham, lean cut (3 oz/85 g) / 2.4 g  Avoid: Chicken, with skin, dark meat (3 oz/85 g) / 4 g  Choose: Chicken, skin removed, dark meat (3 oz/85 g) / 2 g  Avoid: Chicken, with skin, light meat (3 oz/85 g) / 2.5 g  Choose: Chicken, skin removed, light meat (3 oz/85 g) / 1 g Dairy / Saturated Fat (g)  Avoid: Whole milk (1 cup) / 5 g  Choose: Low-fat milk, 2% (1 cup) / 3 g  Choose: Low-fat milk, 1% (1 cup) / 1.5 g  Choose: Skim milk (1 cup) / 0.3 g  Avoid: Hard cheese (1 oz/28 g) / 6 g  Choose: Skim milk cheese (1 oz/28 g) / 2 to 3 g  Avoid: Cottage cheese, 4% fat (1 cup) / 6.5 g  Choose: Low-fat cottage cheese, 1% fat (1 cup) / 1.5 g  Avoid: Ice cream (1 cup) / 9 g  Choose: Sherbet (1 cup) / 2.5 g  Choose: Nonfat frozen yogurt (1 cup) / 0.3 g  Choose: Frozen fruit bar / trace  Avoid: Whipped cream (1 tbs) / 3.5 g  Choose: Nondairy whipped topping (1 tbs) / 1 g Condiments / Saturated Fat (g)  Avoid: Mayonnaise (1 tbs) / 2 g  Choose: Low-fat mayonnaise (1 tbs) / 1 g  Avoid: Butter (1 tbs) / 7 g  Choose: Extra light margarine (1 tbs) / 1 g  Avoid: Coconut oil (1 tbs) / 11.8 g  Choose: Olive oil (1 tbs) / 1.8 g  Choose: Corn oil (1 tbs) / 1.7 g  Choose: Safflower oil (1 tbs) / 1.2 g  Choose: Sunflower oil (1 tbs) / 1.4 g  Choose: Soybean oil (1 tbs) / 2.4 g  Choose: Canola oil (1 tbs) / 1 g Document Released: 06/28/2005 Document Revised: 09/20/2011 Document Reviewed: 12/17/2010 [employer] Patient Information  2014 [place], [place].

## 2013-03-06 NOTE — Assessment & Plan Note (Signed)
BP Readings from Last 3 Encounters:  03/05/13 120/80  10/02/12 126/84  04/07/12 120/76   Very good control of BP. Bmet normal  Plan Continue present medication

## 2013-03-06 NOTE — Progress Notes (Signed)
  Subjective:    Patient ID: [name], male    DOB: 1966/05/05, 47 y.o.   MRN: [id number]  HPI Mr. Feister had lab work done at work and brings in the results for review. (report scanned). Main issues - A1C 7.1 % - an improvement over last office A1C of 7.8%; LDL cholesterol 125.  He has been feeling well and doing well. He does continue to work on his diet and weight loss but has not been successful yet.  PMH, FamHx and SocHx reviewed for any changes and relevance.  Current Outpatient Prescriptions on File Prior to Visit  Medication Sig Dispense Refill  . BENICAR 20 MG tablet TAKE 1 TABLET BY MOUTH EVERY DAY  90 tablet  3  . esomeprazole (NEXIUM) 40 MG packet Take 40 mg by mouth daily before breakfast.  90 each  0  . VIAGRA 100 MG tablet Take 100 mg by mouth as needed. erectile dysfunction       No current facility-administered medications on file prior to visit.      Review of Systems System review is negative for any constitutional, cardiac, pulmonary, GI or neuro symptoms or complaints other than as described in the HPI.     Objective:   Physical Exam Filed Vitals:   03/05/13 1507  BP: 120/80  Pulse: 86  Temp: 98.3 F (36.8 C)   Wt Readings from Last 3 Encounters:  03/05/13 375 lb (170.099 kg)  10/02/12 378 lb 12 oz (171.8 kg)  04/07/12 362 lb (164.202 kg)   Gen'l- obese AA man in no distress HEENT_ C&S clear Cor- RRR PUlm - normal respirations Neuro - A&O x 3, normal gait.       Assessment & Plan:

## 2013-03-06 NOTE — Assessment & Plan Note (Signed)
He is encouraged to continue a weight management program.

## 2013-03-06 NOTE — Assessment & Plan Note (Signed)
Patient with an LDL goal of 100 or less. He was given a trail of crestor 5 mg which was effective but medication was stopped due to leg pain. The leg pain was persistent but he never resumed medication. Reviewed the importance of lipid control in a diabetic.  Plan Lovastatin 40 mg daily  F/u lab Sept 26th (order entered) - patient given note to leave work early for lab.

## 2013-03-06 NOTE — Assessment & Plan Note (Signed)
Doing better with blood sugar.  Plan Continue with life style management at this time  F/u A1c in 3 months

## 2013-04-06 ENCOUNTER — Other Ambulatory Visit (INDEPENDENT_AMBULATORY_CARE_PROVIDER_SITE_OTHER): Payer: BC Managed Care – PPO

## 2013-04-06 DIAGNOSIS — E785 Hyperlipidemia, unspecified: Secondary | ICD-10-CM

## 2013-04-06 LAB — LIPID PANEL
Cholesterol: 156 mg/dL (ref 0–200)
HDL: 38.7 mg/dL — ABNORMAL LOW (ref >39.00)
LDL Cholesterol: 106 mg/dL — ABNORMAL HIGH (ref 0–99)
Total CHOL/HDL Ratio: 4
Triglycerides: 58 mg/dL (ref 0.0–149.0)
VLDL: 11.6 mg/dL (ref 0.0–40.0)

## 2013-04-06 LAB — HEPATIC FUNCTION PANEL
Bilirubin, Direct: 0.1 mg/dL (ref 0.0–0.3)
Total Bilirubin: 0.9 mg/dL (ref 0.3–1.2)

## 2013-04-23 ENCOUNTER — Other Ambulatory Visit: Payer: Self-pay

## 2013-04-23 DIAGNOSIS — E785 Hyperlipidemia, unspecified: Secondary | ICD-10-CM

## 2013-04-23 MED ORDER — LOVASTATIN 40 MG PO TABS: 40.0000 mg | ORAL_TABLET | Freq: Every day | ORAL | Status: DC

## 2013-05-17 ENCOUNTER — Other Ambulatory Visit: Payer: Self-pay

## 2013-07-03 ENCOUNTER — Telehealth: Payer: Self-pay

## 2013-07-03 DIAGNOSIS — IMO0001 Reserved for inherently not codable concepts without codable children: Secondary | ICD-10-CM

## 2013-07-03 MED ORDER — METFORMIN HCL 500 MG PO TABS: 500.0000 mg | ORAL_TABLET | Freq: Two times a day (BID) | ORAL | Status: DC

## 2013-07-03 NOTE — Telephone Encounter (Signed)
LMOM for pt to call. 

## 2013-07-03 NOTE — Telephone Encounter (Signed)
Phone call from patient stating he has been having urinary frequency for about a week. He states he gets up during the night about every 3 hours. He was wondering if Lovastatin would do this?

## 2013-07-03 NOTE — Telephone Encounter (Signed)
Pt is aware.  He will call back if he wants to see a dietician or to make an appt.

## 2013-07-03 NOTE — Telephone Encounter (Signed)
Pt called back to say he needs metformin.  Blood sugar is elevated.  It was 300 an hour ago.  He has not been taking anything.  He was borderline.

## 2013-07-03 NOTE — Telephone Encounter (Signed)
1. Rx for metformin 500 mg bid sent to pharmacy. For the first week take just one a day. 2. Rigor with the diet: NO SUGAR, low carb. Let use know if he needs to meet with a dietician 3. Come in for lab - A1C

## 2013-07-04 ENCOUNTER — Other Ambulatory Visit (INDEPENDENT_AMBULATORY_CARE_PROVIDER_SITE_OTHER): Payer: BC Managed Care – PPO

## 2013-07-04 DIAGNOSIS — IMO0001 Reserved for inherently not codable concepts without codable children: Secondary | ICD-10-CM

## 2013-07-24 ENCOUNTER — Telehealth: Payer: Self-pay | Admitting: Internal Medicine

## 2013-07-24 NOTE — Telephone Encounter (Signed)
Rec'd from [facility] forward 4 pages to Dr.Norins ° °

## 2013-08-21 ENCOUNTER — Other Ambulatory Visit: Payer: Self-pay

## 2013-08-21 DIAGNOSIS — IMO0001 Reserved for inherently not codable concepts without codable children: Secondary | ICD-10-CM

## 2013-08-21 DIAGNOSIS — E1165 Type 2 diabetes mellitus with hyperglycemia: Principal | ICD-10-CM

## 2013-08-21 MED ORDER — METFORMIN HCL 500 MG PO TABS: 500.0000 mg | ORAL_TABLET | Freq: Two times a day (BID) | ORAL | Status: DC

## 2013-08-31 ENCOUNTER — Telehealth: Payer: Self-pay | Admitting: Internal Medicine

## 2013-08-31 NOTE — Telephone Encounter (Signed)
Rec'd from [facility] and Th[facility] forward 5 pages to Dr.Norins

## 2013-09-12 ENCOUNTER — Telehealth: Payer: Self-pay | Admitting: *Deleted

## 2013-09-12 NOTE — Telephone Encounter (Signed)
If his hair stops falling out will give it several weeks and then start pravastatin 40 mg

## 2013-09-12 NOTE — Telephone Encounter (Signed)
Patient phoned stating that the lovastatin was "making his hair fall out" and that until he was told otherwise, was not going to take it anymore.  Please advise.  CB# [phone]

## 2013-09-13 NOTE — Telephone Encounter (Signed)
Patient stated his hair was coming out in patches, and he's stopped taking medication.  Relayed MD response and recommendation, which pt verbalized understanding but also stated he'd like to see PCP one last time prior to retirement.  Transferred to scheduling.

## 2013-09-20 ENCOUNTER — Encounter: Payer: Self-pay | Admitting: Internal Medicine

## 2013-09-20 ENCOUNTER — Ambulatory Visit (INDEPENDENT_AMBULATORY_CARE_PROVIDER_SITE_OTHER): Payer: BC Managed Care – PPO | Admitting: Internal Medicine

## 2013-09-20 VITALS — BP 126/86 | HR 82 | Temp 97.3°F | Wt 366.2 lb

## 2013-09-20 DIAGNOSIS — E669 Obesity, unspecified: Secondary | ICD-10-CM

## 2013-09-20 DIAGNOSIS — E785 Hyperlipidemia, unspecified: Secondary | ICD-10-CM

## 2013-09-20 DIAGNOSIS — E1165 Type 2 diabetes mellitus with hyperglycemia: Secondary | ICD-10-CM

## 2013-09-20 DIAGNOSIS — IMO0001 Reserved for inherently not codable concepts without codable children: Secondary | ICD-10-CM

## 2013-09-20 DIAGNOSIS — I10 Essential (primary) hypertension: Secondary | ICD-10-CM

## 2013-09-20 MED ORDER — VARDENAFIL HCL 20 MG PO TABS: 20.0000 mg | ORAL_TABLET | Freq: Every day | ORAL | Status: DC | PRN

## 2013-09-20 NOTE — Progress Notes (Signed)
Pre visit review using our clinic review tool, if applicable. No additional management support is needed unless otherwise documented below in the visit note. 

## 2013-09-20 NOTE — Patient Instructions (Signed)
Good to see you.  Hair loss is listed as a rare, less severe side effect of lovastatin.  High cholesterol - the last LDL was 106 on medication, 133 before medication. The goal is 100 or less due to your increased risk of heart disease. Plan Stay off the lovastatin  Repeat cholesterol panel March 23rd  Diabetes - Last A1C 10.9% Plan Repeat A1C March 23rd.  Weight management: Wt Readings from Last 3 Encounters:  09/20/13 366 lb 4 oz (166.13 kg)  03/05/13 375 lb (170.099 kg)  10/02/12 378 lb 12 oz (171.8 kg)   Plan:  Diet management: smart food choices, PORTION SIZE CONTROL, regular exercise. Goal - to loose 1-2 lbs.month. Target weight - 300 lbs over the next 3 years.

## 2013-09-23 NOTE — Assessment & Plan Note (Signed)
Wt Readings from Last 3 Encounters:  09/20/13 366 lb 4 oz (166.13 kg)  03/05/13 375 lb (170.099 kg)  10/02/12 378 lb 12 oz (171.8 kg)   9 lbs off since August.  Plan - Diet management: smart food choices, PORTION SIZE CONTROL, regular exercise. Goal - to loose 1-2 lbs.month. Target weight - 300 lbs

## 2013-09-23 NOTE — Assessment & Plan Note (Signed)
Lab Results  Component Value Date   HGBA1C 10.9* 07/04/2013   Plan Repeat A1C March 23rd with recommendations to follow.

## 2013-09-23 NOTE — Assessment & Plan Note (Signed)
BP Readings from Last 3 Encounters:  09/20/13 126/86  03/05/13 120/80  10/02/12 126/84   Good control on present medication.

## 2013-09-23 NOTE — Progress Notes (Signed)
   Subjective:    Patient ID: [name], male    DOB: 04-Apr-1966, 48 y.o.   MRN: [id number]  HPI [name] presents to discuss lipid management and medications. He has had hair loss and attributed it to the use of Lovastatin. This is listed as a rare side effect. He has stopped medication but it is too early to tell if hair loss has stopped.  He has been adherent to DM regimen and will be due for follow up lab  He is feeling well.   Review of Systems System review is negative for any constitutional, cardiac, pulmonary, GI or neuro symptoms or complaints other than as described in the HPI.      Objective:   Physical Exam Filed Vitals:   09/20/13 1614  BP: 126/86  Pulse: 82  Temp: 97.3 F (36.3 C)   Wt Readings from Last 3 Encounters:  09/20/13 366 lb 4 oz (166.13 kg)  03/05/13 375 lb (170.099 kg)  10/02/12 378 lb 12 oz (171.8 kg)   Gen'l- obese man in no distress Cor- 2+ radial pulse Pul - normal respirations Derm - no bald spots noted.       Assessment & Plan:  Hair loss - possibly related to lovastatin  Plan  Remain off medication and watch for decrease in hairloss.

## 2013-09-27 ENCOUNTER — Telehealth: Payer: Self-pay

## 2013-09-27 NOTE — Telephone Encounter (Signed)
Relevant patient education assigned to patient using Emmi. ° °

## 2013-09-28 ENCOUNTER — Other Ambulatory Visit: Payer: Self-pay | Admitting: Internal Medicine

## 2013-10-01 ENCOUNTER — Other Ambulatory Visit (INDEPENDENT_AMBULATORY_CARE_PROVIDER_SITE_OTHER): Payer: BC Managed Care – PPO

## 2013-10-01 DIAGNOSIS — IMO0001 Reserved for inherently not codable concepts without codable children: Secondary | ICD-10-CM

## 2013-10-01 DIAGNOSIS — E1165 Type 2 diabetes mellitus with hyperglycemia: Principal | ICD-10-CM

## 2013-10-01 DIAGNOSIS — E785 Hyperlipidemia, unspecified: Secondary | ICD-10-CM

## 2013-10-01 LAB — LIPID PANEL
Cholesterol: 140 mg/dL (ref 0–200)
HDL: 38.9 mg/dL — ABNORMAL LOW (ref >39.00)
LDL Cholesterol: 93 mg/dL (ref 0–99)
TRIGLYCERIDES: 43 mg/dL (ref 0.0–149.0)
Total CHOL/HDL Ratio: 4
VLDL: 8.6 mg/dL (ref 0.0–40.0)

## 2013-10-01 LAB — HEMOGLOBIN A1C: HEMOGLOBIN A1C: 7.4 % — AB (ref 4.6–6.5)

## 2013-11-19 ENCOUNTER — Other Ambulatory Visit: Payer: Self-pay | Admitting: Sports Medicine

## 2013-11-19 DIAGNOSIS — M5430 Sciatica, unspecified side: Secondary | ICD-10-CM

## 2013-11-19 DIAGNOSIS — M545 Low back pain, unspecified: Secondary | ICD-10-CM

## 2013-11-23 ENCOUNTER — Other Ambulatory Visit: Payer: Self-pay

## 2013-12-04 ENCOUNTER — Ambulatory Visit (INDEPENDENT_AMBULATORY_CARE_PROVIDER_SITE_OTHER): Payer: BC Managed Care – PPO | Admitting: Internal Medicine

## 2013-12-04 ENCOUNTER — Encounter: Payer: Self-pay | Admitting: Internal Medicine

## 2013-12-04 VITALS — BP 120/70 | HR 96 | Temp 98.2°F | Wt 350.0 lb

## 2013-12-04 DIAGNOSIS — E1165 Type 2 diabetes mellitus with hyperglycemia: Secondary | ICD-10-CM

## 2013-12-04 DIAGNOSIS — Z0000 Encounter for general adult medical examination without abnormal findings: Secondary | ICD-10-CM

## 2013-12-04 DIAGNOSIS — M109 Gout, unspecified: Secondary | ICD-10-CM

## 2013-12-04 DIAGNOSIS — IMO0001 Reserved for inherently not codable concepts without codable children: Secondary | ICD-10-CM

## 2013-12-04 DIAGNOSIS — I10 Essential (primary) hypertension: Secondary | ICD-10-CM

## 2013-12-04 MED ORDER — METHYLPREDNISOLONE 4 MG PO KIT: 4.0000 mg | PACK | ORAL | Status: DC

## 2013-12-04 MED ORDER — METHYLPREDNISOLONE 4 MG PO KIT: 8.0000 mg | PACK | Freq: Every morning | ORAL | Status: DC

## 2013-12-04 MED ORDER — METHYLPREDNISOLONE 4 MG PO KIT: 8.0000 mg | PACK | Freq: Every evening | ORAL | Status: DC

## 2013-12-04 MED ORDER — METHYLPREDNISOLONE ACETATE 80 MG/ML IJ SUSP
80.0000 mg | Freq: Once | INTRAMUSCULAR | Status: AC
  Administered 2013-12-04: 80 mg via INTRAMUSCULAR

## 2013-12-04 MED ORDER — METHYLPREDNISOLONE 4 MG PO KIT: 4.0000 mg | PACK | Freq: Three times a day (TID) | ORAL | Status: DC

## 2013-12-04 MED ORDER — METHYLPREDNISOLONE 4 MG PO KIT: 4.0000 mg | PACK | Freq: Four times a day (QID) | ORAL | Status: DC

## 2013-12-04 MED ORDER — METHYLPREDNISOLONE 4 MG PO KIT: PACK | ORAL | Status: DC

## 2013-12-04 MED ORDER — HYDROCODONE-ACETAMINOPHEN 5-325 MG PO TABS: 1.0000 | ORAL_TABLET | Freq: Four times a day (QID) | ORAL | Status: DC | PRN

## 2013-12-04 NOTE — Assessment & Plan Note (Signed)
First episode, for depomedrol IM, then medrol dose pack,  to f/u any worsening symptoms or concerns - consider colchicine daily or allopurinol if recurs

## 2013-12-04 NOTE — Assessment & Plan Note (Signed)
Ok to double up on the metformin while taking steroid tx,  to f/u any worsening symptoms or concerns

## 2013-12-04 NOTE — Patient Instructions (Signed)
You had the steroid shot today  Please take all new medication as prescribed - the medrol pack (21 pills only)  Please continue all other medications as before, and refills have been done  - the pain medication  Please have the pharmacy call with any other refills you may need.  You are given the work note as well  Please return in 6 months, or sooner if needed, with Lab testing done 3-5 days before

## 2013-12-04 NOTE — Progress Notes (Signed)
Pre visit review using our clinic review tool, if applicable. No additional management support is needed unless otherwise documented below in the visit note. 

## 2013-12-04 NOTE — Progress Notes (Signed)
Subjective:    Patient ID: [name], male    DOB: [birth year], 48 y.o.   MRN: [id number]  HPI  Here with 4 days acute onset left first MTP marked red,tender, swelling without trauma, just woke with it, sheets hurt the foot. Seen at [facility] ER - had mild elev uric acid level only, neg xray for fx., tx with colchicine and vicodin 5/325 but little help so far.   Pt denies polydipsia, polyuria, or low sugar symptoms such as weakness or confusion improved with po intake.  Pt states overall good compliance with meds, trying to follow lower cholesterol, diabetic diet, wt overall stable but little exercise however.    Pt denies chest pain, increased sob or doe, wheezing, orthopnea, PND, increased LE swelling, palpitations, dizziness or syncope. Past Medical History  Diagnosis Date  . Hyperlipidemia   . HTN (hypertension)   . Hyperglycemia   . Gallstones   . Arthritis   . DJD (degenerative joint disease)   . Obesity   . Diabetes mellitus    Past Surgical History  Procedure Laterality Date  . Repair knee ligament      Left  . Back cyst excision      x2  . Tennis elbow surgery, nerve entrapment      x2 right  . Shoulder surgery, reconstruction of joint      Left x 3, Last 02/2007  . Joint replacement  01/26/2011    right knee  . Right foot      BONES STRAIGHTENED OUT   2006  . Cholecystectomy  08/11/2011    Procedure: LAPAROSCOPIC CHOLECYSTECTOMY WITH INTRAOPERATIVE CHOLANGIOGRAM;  Surgeon: Judieth Keens, DO;  Location: [facility];  Service: General;  Laterality: N/A;  Laparoscopic cholecystectomy with cholangiogram.  . Knee arthroscopy      x 2 left  . Knee arthroscopy      x 2 right    reports that he has never smoked. He has never used smokeless tobacco. He reports that he drinks alcohol. He reports that he does not use illicit drugs. family history includes Colon cancer (age of onset: 4) in his brother; Ovarian cancer in his maternal grandmother; Sarcoidosis in his  mother. There is no history of Cancer, Heart disease, or Kidney disease. No Known Allergies Current Outpatient Prescriptions on File Prior to Visit  Medication Sig Dispense Refill  . BENICAR 20 MG tablet TAKE 1 TABLET BY MOUTH EVERY DAY  90 tablet  3  . esomeprazole (NEXIUM) 40 MG packet Take 40 mg by mouth daily before breakfast.  90 each  0  . lovastatin (MEVACOR) 40 MG tablet Take 1 tablet (40 mg total) by mouth at bedtime.  90 tablet  3  . metFORMIN (GLUCOPHAGE) 500 MG tablet Take 1 tablet (500 mg total) by mouth 2 (two) times daily with a meal.  180 tablet  1  . vardenafil (LEVITRA) 20 MG tablet Take 1 tablet (20 mg total) by mouth daily as needed for erectile dysfunction.  6 tablet  5   No current facility-administered medications on file prior to visit.      Review of Systems  Constitutional: Negative for unusual diaphoresis or other sweats  HENT: Negative for ringing in ear Eyes: Negative for double vision or worsening visual disturbance.  Respiratory: Negative for choking and stridor.   Gastrointestinal: Negative for vomiting or other signifcant bowel change Genitourinary: Negative for hematuria or decreased urine volume.  Musculoskeletal: Negative for other MSK pain or swelling Skin: Negative  for color change and worsening wound.  Neurological: Negative for tremors and numbness other than noted  Psychiatric/Behavioral: Negative for decreased concentration or agitation other than above       Objective:   Physical Exam BP 120/70  Pulse 96  Temp(Src) 98.2 F (36.8 C) (Oral)  Wt 350 lb (158.759 kg)  SpO2 96% VS noted,  Constitutional: Pt appears well-developed, well-nourished.  HENT: Head: NCAT.  Right Ear: External ear normal.  Left Ear: External ear normal.  Eyes: . Pupils are equal, round, and reactive to light. Conjunctivae and EOM are normal Neck: Normal range of motion. Neck supple.  Cardiovascular: Normal rate and regular rhythm.   Pulmonary/Chest: Effort  normal and breath sounds normal.  Neurological: Pt is alert. Not confused , motor grossly intact Skin: Skin is warm. No rash Psychiatric: Pt behavior is normal. No agitation.  Left first MTP 3+ red/tender/swelling    Assessment & Plan:

## 2013-12-04 NOTE — Assessment & Plan Note (Signed)
stable overall by history and exam, recent data reviewed with pt, and pt to continue medical treatment as before,  to f/u any worsening symptoms or concerns BP Readings from Last 3 Encounters:  12/04/13 120/70  09/20/13 126/86  03/05/13 120/80

## 2014-02-19 ENCOUNTER — Other Ambulatory Visit: Payer: Self-pay

## 2014-02-19 DIAGNOSIS — E1165 Type 2 diabetes mellitus with hyperglycemia: Principal | ICD-10-CM

## 2014-02-19 DIAGNOSIS — IMO0001 Reserved for inherently not codable concepts without codable children: Secondary | ICD-10-CM

## 2014-02-19 MED ORDER — METFORMIN HCL 500 MG PO TABS: 500.0000 mg | ORAL_TABLET | Freq: Two times a day (BID) | ORAL | Status: DC

## 2014-05-28 ENCOUNTER — Other Ambulatory Visit (INDEPENDENT_AMBULATORY_CARE_PROVIDER_SITE_OTHER): Payer: BC Managed Care – PPO

## 2014-05-28 ENCOUNTER — Telehealth: Payer: Self-pay

## 2014-05-28 ENCOUNTER — Ambulatory Visit (INDEPENDENT_AMBULATORY_CARE_PROVIDER_SITE_OTHER): Payer: BC Managed Care – PPO | Admitting: Internal Medicine

## 2014-05-28 ENCOUNTER — Encounter: Payer: Self-pay | Admitting: Internal Medicine

## 2014-05-28 VITALS — BP 108/82 | HR 95 | Temp 98.3°F | Ht 74.0 in | Wt 363.1 lb

## 2014-05-28 DIAGNOSIS — IMO0002 Reserved for concepts with insufficient information to code with codable children: Secondary | ICD-10-CM

## 2014-05-28 DIAGNOSIS — M109 Gout, unspecified: Secondary | ICD-10-CM

## 2014-05-28 DIAGNOSIS — Z0000 Encounter for general adult medical examination without abnormal findings: Secondary | ICD-10-CM

## 2014-05-28 DIAGNOSIS — E119 Type 2 diabetes mellitus without complications: Secondary | ICD-10-CM

## 2014-05-28 DIAGNOSIS — E1165 Type 2 diabetes mellitus with hyperglycemia: Secondary | ICD-10-CM

## 2014-05-28 DIAGNOSIS — M25562 Pain in left knee: Secondary | ICD-10-CM | POA: Insufficient documentation

## 2014-05-28 LAB — BASIC METABOLIC PANEL
BUN: 17 mg/dL (ref 6–23)
CALCIUM: 9.7 mg/dL (ref 8.4–10.5)
CHLORIDE: 106 meq/L (ref 96–112)
CO2: 27 meq/L (ref 19–32)
CREATININE: 0.9 mg/dL (ref 0.4–1.5)
GFR: 110.83 mL/min (ref >60.00)
Glucose, Bld: 123 mg/dL — ABNORMAL HIGH (ref 70–99)
Potassium: 4.1 mEq/L (ref 3.5–5.1)
SODIUM: 139 meq/L (ref 135–145)

## 2014-05-28 LAB — HEPATIC FUNCTION PANEL
ALT: 32 U/L (ref 0–53)
AST: 20 U/L (ref 0–37)
Albumin: 4.5 g/dL (ref 3.5–5.2)
Alkaline Phosphatase: 58 U/L (ref 39–117)
BILIRUBIN TOTAL: 0.5 mg/dL (ref 0.2–1.2)
Bilirubin, Direct: 0.1 mg/dL (ref 0.0–0.3)
TOTAL PROTEIN: 7.9 g/dL (ref 6.0–8.3)

## 2014-05-28 LAB — PSA: PSA: 0.22 ng/mL (ref 0.10–4.00)

## 2014-05-28 LAB — HEMOGLOBIN A1C: HEMOGLOBIN A1C: 6.5 % (ref 4.6–6.5)

## 2014-05-28 LAB — URINALYSIS, ROUTINE W REFLEX MICROSCOPIC
Bilirubin Urine: NEGATIVE
HGB URINE DIPSTICK: NEGATIVE
KETONES UR: NEGATIVE
Leukocytes, UA: NEGATIVE
Nitrite: NEGATIVE
RBC / HPF: NONE SEEN (ref 0–?)
Specific Gravity, Urine: >=1.03 — AB (ref 1.000–1.030)
TOTAL PROTEIN, URINE-UPE24: NEGATIVE
UROBILINOGEN UA: 0.2 (ref 0.0–1.0)
Urine Glucose: NEGATIVE
pH: 6 (ref 5.0–8.0)

## 2014-05-28 LAB — CBC WITH DIFFERENTIAL/PLATELET
BASOS ABS: 0 10*3/uL (ref 0.0–0.1)
BASOS PCT: 0.2 % (ref 0.0–3.0)
EOS ABS: 0.1 10*3/uL (ref 0.0–0.7)
Eosinophils Relative: 0.8 % (ref 0.0–5.0)
HCT: 42.3 % (ref 39.0–52.0)
HEMOGLOBIN: 13.3 g/dL (ref 13.0–17.0)
Lymphocytes Relative: 35.7 % (ref 12.0–46.0)
Lymphs Abs: 3.1 10*3/uL (ref 0.7–4.0)
MCHC: 31.5 g/dL (ref 30.0–36.0)
MCV: 82.1 fl (ref 78.0–100.0)
MONO ABS: 0.8 10*3/uL (ref 0.1–1.0)
Monocytes Relative: 8.8 % (ref 3.0–12.0)
NEUTROS ABS: 4.8 10*3/uL (ref 1.4–7.7)
Neutrophils Relative %: 54.5 % (ref 43.0–77.0)
Platelets: 218 10*3/uL (ref 150.0–400.0)
RBC: 5.15 Mil/uL (ref 4.22–5.81)
RDW: 14.3 % (ref 11.5–15.5)
WBC: 8.8 10*3/uL (ref 4.0–10.5)

## 2014-05-28 LAB — TSH: TSH: 1.76 u[IU]/mL (ref 0.35–4.50)

## 2014-05-28 LAB — LIPID PANEL
Cholesterol: 171 mg/dL (ref 0–200)
HDL: 37.5 mg/dL — AB (ref >39.00)
LDL Cholesterol: 113 mg/dL — ABNORMAL HIGH (ref 0–99)
NonHDL: 133.5
Total CHOL/HDL Ratio: 5
Triglycerides: 104 mg/dL (ref 0.0–149.0)
VLDL: 20.8 mg/dL (ref 0.0–40.0)

## 2014-05-28 LAB — MICROALBUMIN / CREATININE URINE RATIO
CREATININE, U: 205 mg/dL
MICROALB UR: 1.1 mg/dL (ref 0.0–1.9)
Microalb Creat Ratio: 0.5 mg/g (ref 0.0–30.0)

## 2014-05-28 MED ORDER — HYDROCODONE-ACETAMINOPHEN 5-325 MG PO TABS: 1.0000 | ORAL_TABLET | Freq: Four times a day (QID) | ORAL | Status: DC | PRN

## 2014-05-28 MED ORDER — MELOXICAM 15 MG PO TABS: 15.0000 mg | ORAL_TABLET | Freq: Every day | ORAL | Status: DC

## 2014-05-28 NOTE — Assessment & Plan Note (Signed)
C/w DJD, for mobic prn, f/u ortho as planned

## 2014-05-28 NOTE — Assessment & Plan Note (Signed)

## 2014-05-28 NOTE — Patient Instructions (Signed)
Please take all new medication as prescribed- the mobic  Please continue all other medications as before, and refills have been done if requested.  Please have the pharmacy call with any other refills you may need.  Please continue your efforts at being more active, low cholesterol diet, and weight control.  You are otherwise up to date with prevention measures today.  Please keep your appointments with your specialists as you may have planned - orthopedic for the left knee  Your lab work was drawn today  You will be contacted by phone if any changes need to be made immediately.  Otherwise, you will receive a letter about your results with an explanation, but please check with MyChart first.  Please remember to sign up for MyChart if you have not done so, as this will be important to you in the future with finding out test results, communicating by private email, and scheduling acute appointments online when needed.  Please return in 6 months, or sooner if needed, with Lab testing done 3-5 days before

## 2014-05-28 NOTE — Progress Notes (Signed)
Pre visit review using our clinic review tool, if applicable. No additional management support is needed unless otherwise documented below in the visit note. 

## 2014-05-28 NOTE — Progress Notes (Signed)
Subjective:    Patient ID: [name], male    DOB: 28-May-1966, 48 y.o.   MRN: [id number]  HPI  Here for wellness and f/u;  Overall doing ok;  Pt denies CP, worsening SOB, DOE, wheezing, orthopnea, PND, worsening LE edema, palpitations, dizziness or syncope.  Pt denies neurological change such as new headache, facial or extremity weakness.  Pt denies polydipsia, polyuria, or low sugar symptoms. Pt states overall good compliance with treatment and medications, good tolerability, and has been trying to follow lower cholesterol diet.  Pt denies worsening depressive symptoms, suicidal ideation or panic. No fever, night sweats, wt loss, loss of appetite, or other constitutional symptoms.  Pt states good ability with ADL's, has low fall risk, home safety reviewed and adequate, no other significant changes in hearing or vision, and only occasionally active with exercise.  Arthritis flaring much, with some wt gain, left knee worsening with swelling and mod to severe pain, worse to walk, needs mobic refill, plans to f/u with orthopeidc. Already s/p right knee TKR. Does c/o ongoing fatigue, but denies signficant daytime hypersomnolence. Has low desire as well, asks for testosterone check Past Medical History  Diagnosis Date  . Hyperlipidemia   . HTN (hypertension)   . Hyperglycemia   . Gallstones   . Arthritis   . DJD (degenerative joint disease)   . Obesity   . Diabetes mellitus    Past Surgical History  Procedure Laterality Date  . Repair knee ligament      Left  . Back cyst excision      x2  . Tennis elbow surgery, nerve entrapment      x2 right  . Shoulder surgery, reconstruction of joint      Left x 3, Last 02/2007  . Joint replacement  01/26/2011    right knee  . Right foot      BONES STRAIGHTENED OUT   2006  . Cholecystectomy  08/11/2011    Procedure: LAPAROSCOPIC CHOLECYSTECTOMY WITH INTRAOPERATIVE CHOLANGIOGRAM;  Surgeon: Judieth Keens, DO;  Location: [facility];  Service: General;   Laterality: N/A;  Laparoscopic cholecystectomy with cholangiogram.  . Knee arthroscopy      x 2 left  . Knee arthroscopy      x 2 right    reports that he has never smoked. He has never used smokeless tobacco. He reports that he drinks alcohol. He reports that he does not use illicit drugs. family history includes Colon cancer (age of onset: 36) in his brother; Ovarian cancer in his maternal grandmother; Sarcoidosis in his mother. There is no history of Cancer, Heart disease, or Kidney disease. No Known Allergies  Review of Systems Constitutional: Negative for increased diaphoresis, other activity, appetite or other siginficant weight change  HENT: Negative for worsening hearing loss, ear pain, facial swelling, mouth sores and neck stiffness.   Eyes: Negative for other worsening pain, redness or visual disturbance.  Respiratory: Negative for shortness of breath and wheezing.   Cardiovascular: Negative for chest pain and palpitations.  Gastrointestinal: Negative for diarrhea, blood in stool, abdominal distention or other pain Genitourinary: Negative for hematuria, flank pain or change in urine volume.  Musculoskeletal: Negative for myalgias or other joint complaints.  Skin: Negative for color change and wound.  Neurological: Negative for syncope and numbness. other than noted Hematological: Negative for adenopathy. or other swelling Psychiatric/Behavioral: Negative for hallucinations, self-injury, decreased concentration or other worsening agitation.      Objective:   Physical Exam BP 108/82 mmHg  Pulse 95  Temp(Src) 98.3 F (36.8 C) (Oral)  Ht 6\' 2"  (1.88 m)  Wt 363 lb 2 oz (164.712 kg)  BMI 46.60 kg/m2  SpO2 97% VS noted,  Constitutional: Pt is oriented to person, place, and time. Appears well-developed and well-nourished.  Head: Normocephalic and atraumatic.  Right Ear: External ear normal.  Left Ear: External ear normal.  Nose: Nose normal.  Mouth/Throat: Oropharynx is  clear and moist.  Eyes: Conjunctivae and EOM are normal. Pupils are equal, round, and reactive to light.  Neck: Normal range of motion. Neck supple. No JVD present. No tracheal deviation present.  Cardiovascular: Normal rate, regular rhythm, normal heart sounds and intact distal pulses.   Pulmonary/Chest: Effort normal and breath sounds without rales or wheezing  Abdominal: Soft. Bowel sounds are normal. NT. No HSM  Musculoskeletal: Normal range of motion. Exhibits no edema.  Lymphadenopathy:  Has no cervical adenopathy.  Neurological: Pt is alert and oriented to person, place, and time. Pt has normal reflexes. No cranial nerve deficit. Motor grossly intact Skin: Skin is warm and dry. No rash noted.  Psychiatric:  Has normal mood and affect. Behavior is normal.    Wt Readings from Last 3 Encounters:  05/28/14 363 lb 2 oz (164.712 kg)  12/04/13 350 lb (158.759 kg)  09/20/13 366 lb 4 oz (166.13 kg)       Assessment & Plan:

## 2014-05-28 NOTE — Telephone Encounter (Signed)
Faxed addon to the lab for testosterone leve.

## 2014-05-28 NOTE — Assessment & Plan Note (Signed)
resolved 

## 2014-05-28 NOTE — Assessment & Plan Note (Signed)
stable overall by history and exam, recent data reviewed with pt, and pt to continue medical treatment as before,  to f/u any worsening symptoms or concerns Lab Results  Component Value Date   HGBA1C 7.4* 10/01/2013

## 2014-05-29 ENCOUNTER — Telehealth: Payer: Self-pay | Admitting: Internal Medicine

## 2014-05-29 ENCOUNTER — Other Ambulatory Visit (INDEPENDENT_AMBULATORY_CARE_PROVIDER_SITE_OTHER): Payer: BC Managed Care – PPO

## 2014-05-29 ENCOUNTER — Telehealth: Payer: Self-pay

## 2014-05-29 DIAGNOSIS — E291 Testicular hypofunction: Secondary | ICD-10-CM

## 2014-05-29 LAB — TESTOSTERONE: Testosterone: 139.75 ng/dL — ABNORMAL LOW (ref 300.00–890.00)

## 2014-05-29 MED ORDER — TESTOSTERONE 50 MG/5GM (1%) TD GEL: 5.0000 g | Freq: Every day | TRANSDERMAL | Status: DC

## 2014-05-29 NOTE — Telephone Encounter (Signed)
Faxed hardcopy for Testosterone to [facility] [place].

## 2014-05-29 NOTE — Telephone Encounter (Signed)
Done hardcopy to robin  

## 2014-05-29 NOTE — Telephone Encounter (Signed)
Testosterone is not covered by patients insurance.  Please advise on alternative.

## 2014-05-30 NOTE — Telephone Encounter (Signed)
Jori Moll to ask pt, if he is [place] with starting testosterone as recommended, unfortunately the androgel is not covered by his insurance  Please ask pt to check with pharmacist regarding a similar med which is covered,  Or we could go with testosterone injections every 2 wks (which is generic0

## 2014-05-30 NOTE — Telephone Encounter (Signed)
Patient informed and will call back once he speaks to his pharmacist.

## 2014-07-24 ENCOUNTER — Other Ambulatory Visit: Payer: Self-pay | Admitting: *Deleted

## 2014-07-24 MED ORDER — OLMESARTAN MEDOXOMIL 20 MG PO TABS: 20.0000 mg | ORAL_TABLET | Freq: Every day | ORAL | Status: DC

## 2014-08-01 ENCOUNTER — Other Ambulatory Visit: Payer: Self-pay | Admitting: Otolaryngology

## 2014-08-01 DIAGNOSIS — R599 Enlarged lymph nodes, unspecified: Secondary | ICD-10-CM

## 2014-08-01 DIAGNOSIS — R220 Localized swelling, mass and lump, head: Secondary | ICD-10-CM

## 2014-08-01 DIAGNOSIS — D490 Neoplasm of unspecified behavior of digestive system: Secondary | ICD-10-CM

## 2014-08-01 DIAGNOSIS — R609 Edema, unspecified: Secondary | ICD-10-CM

## 2014-08-01 DIAGNOSIS — R221 Localized swelling, mass and lump, neck: Principal | ICD-10-CM

## 2014-08-05 LAB — HM DIABETES EYE EXAM

## 2014-08-06 ENCOUNTER — Ambulatory Visit (INDEPENDENT_AMBULATORY_CARE_PROVIDER_SITE_OTHER): Payer: BLUE CROSS/BLUE SHIELD | Admitting: Internal Medicine

## 2014-08-06 ENCOUNTER — Encounter: Payer: Self-pay | Admitting: Internal Medicine

## 2014-08-06 VITALS — BP 108/70 | HR 92 | Temp 97.8°F | Ht 74.0 in | Wt 364.2 lb

## 2014-08-06 DIAGNOSIS — I10 Essential (primary) hypertension: Secondary | ICD-10-CM

## 2014-08-06 DIAGNOSIS — E119 Type 2 diabetes mellitus without complications: Secondary | ICD-10-CM

## 2014-08-06 DIAGNOSIS — E785 Hyperlipidemia, unspecified: Secondary | ICD-10-CM

## 2014-08-06 NOTE — Assessment & Plan Note (Signed)
stable overall by history and exam, recent data reviewed with pt, and pt to continue medical treatment as before,  to f/u any worsening symptoms or concerns Lab Results  Component Value Date   LDLCALC 113* 05/28/2014   Will defer further tx such as increased statin for now, for lower chol diet, should also improve with wt loss

## 2014-08-06 NOTE — Progress Notes (Signed)
Subjective:    Patient ID: [name], male    DOB: 1965/08/07, 49 y.o.   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  Has lost wt in past, but limited due to bilateral knee djd.  Due for left knee TKR in dec 2016 per pt, already s/p right knee TKR. Wants to lose from 364 to 230, leaning toward the sleeve, but wants to further find out more. Pt denies chest pain, increased sob or doe, wheezing, orthopnea, PND, increased LE swelling, palpitations, dizziness or syncope.  Pt denies new neurological symptoms such as new headache, or facial or extremity weakness or numbness   Pt denies polydipsia, polyuria  Past Medical History  Diagnosis Date  . Hyperlipidemia   . HTN (hypertension)   . Hyperglycemia   . Gallstones   . Arthritis   . DJD (degenerative joint disease)   . Obesity   . Diabetes mellitus    Past Surgical History  Procedure Laterality Date  . Repair knee ligament      Left  . Back cyst excision      x2  . Tennis elbow surgery, nerve entrapment      x2 right  . Shoulder surgery, reconstruction of joint      Left x 3, Last 02/2007  . Joint replacement  01/26/2011    right knee  . Right foot      BONES STRAIGHTENED OUT   2006  . Cholecystectomy  08/11/2011    Procedure: LAPAROSCOPIC CHOLECYSTECTOMY WITH INTRAOPERATIVE CHOLANGIOGRAM;  Surgeon: Judieth Keens, DO;  Location: [facility];  Service: General;  Laterality: N/A;  Laparoscopic cholecystectomy with cholangiogram.  . Knee arthroscopy      x 2 left  . Knee arthroscopy      x 2 right    reports that he has never smoked. He has never used smokeless tobacco. He reports that he drinks alcohol. He reports that he does not use illicit drugs. family history includes Colon cancer (age of onset: 76) in his brother; Ovarian cancer in his maternal grandmother; Sarcoidosis in his mother. There is  no history of Cancer, Heart disease, or Kidney disease. No Known Allergies Current Outpatient Prescriptions on File Prior to Visit  Medication Sig Dispense Refill  . colchicine 0.6 MG tablet Take 0.6 mg by mouth daily.    [name] esomeprazole (NEXIUM) 40 MG packet Take 40 mg by mouth daily before breakfast. 90 each 0  . HYDROcodone-acetaminophen (NORCO/VICODIN) 5-325 MG per tablet Take 1 tablet by mouth every 6 (six) hours as needed for moderate pain. 40 tablet 0  . lovastatin (MEVACOR) 40 MG tablet Take 1 tablet (40 mg total) by mouth at bedtime. 90 tablet 3  . meloxicam (MOBIC) 15 MG tablet Take 1 tablet (15 mg total) by mouth daily. 30 tablet 5  . metFORMIN (GLUCOPHAGE) 500 MG tablet Take 1 tablet (500 mg total) by mouth 2 (two) times daily with a meal. 180 tablet 1  . olmesartan (BENICAR) 20 MG tablet Take 1 tablet (20 mg total) by mouth daily. 90 tablet 0  . testosterone (ANDROGEL) 50 MG/5GM (1%) GEL Place 5 g onto the skin daily. 450 g 1  . vardenafil (LEVITRA) 20 MG tablet Take 1 tablet (20 mg total) by mouth daily as needed for erectile dysfunction. 6 tablet 5   No current facility-administered medications  on file prior to visit.   Review of Systems  Constitutional: Negative for unusual diaphoresis or other sweats  HENT: Negative for ringing in ear Eyes: Negative for double vision or worsening visual disturbance.  Respiratory: Negative for choking and stridor.   Gastrointestinal: Negative for vomiting or other signifcant bowel change Genitourinary: Negative for hematuria or decreased urine volume.  Musculoskeletal: Negative for other MSK pain or swelling Skin: Negative for color change and worsening wound.  Neurological: Negative for tremors and numbness other than noted  Psychiatric/Behavioral: Negative for decreased concentration or agitation other than above       Objective:   Physical Exam BP 108/70 mmHg  Pulse 92  Temp(Src) 97.8 F (36.6 C) (Oral)  Ht 6\' 2"  (1.88 m)  Wt 364  lb 4 oz (165.223 kg)  BMI 46.75 kg/m2  SpO2 94% VS noted,  Constitutional: Pt appears well-developed, well-nourished. /morbid obese HENT: Head: NCAT.  Right Ear: External ear normal.  Left Ear: External ear normal.  Eyes: . Pupils are equal, round, and reactive to light. Conjunctivae and EOM are normal Neck: Normal range of motion. Neck supple.  Cardiovascular: Normal rate and regular rhythm.   Pulmonary/Chest: Effort normal and breath sounds without rales or wheezing.  Abd:  Soft, NT, ND, + BS Neurological: Pt is alert. Not confused , motor grossly intact Skin: Skin is warm. No rash Psychiatric: Pt behavior is normal. No agitation.      Assessment & Plan:

## 2014-08-06 NOTE — Assessment & Plan Note (Signed)
Ok to pursue bariatric program as he desires, no contraindication at this time, and if lost even 100 lbs would most likely be off all metabolic meds

## 2014-08-06 NOTE — Patient Instructions (Signed)
Please continue all other medications as before, and refills have been done if requested.  Please have the pharmacy call with any other refills you may need.  Please continue your efforts at being more active, low cholesterol diet, and weight control.  Please keep your appointments with your specialists as you may have planned  You will be contacted regarding the referral for: colonoscopy for June 2016 or after

## 2014-08-06 NOTE — Progress Notes (Signed)
Pre visit review using our clinic review tool, if applicable. No additional management support is needed unless otherwise documented below in the visit note. 

## 2014-08-06 NOTE — Assessment & Plan Note (Signed)
stable overall by history and exam, recent data reviewed with pt, and pt to continue medical treatment as before,  to f/u any worsening symptoms or concerns Lab Results  Component Value Date   HGBA1C 6.5 05/28/2014

## 2014-08-06 NOTE — Assessment & Plan Note (Signed)
stable overall by history and exam, recent data reviewed with pt, and pt to continue medical treatment as before,  to f/u any worsening symptoms or concerns BP Readings from Last 3 Encounters:  08/06/14 108/70  05/28/14 108/82  12/04/13 120/70

## 2014-08-08 ENCOUNTER — Telehealth: Payer: Self-pay | Admitting: Internal Medicine

## 2014-08-08 NOTE — Telephone Encounter (Signed)
emmi emailed °

## 2014-08-19 ENCOUNTER — Ambulatory Visit
Admission: RE | Admit: 2014-08-19 | Discharge: 2014-08-19 | Disposition: A | Discharge status: Home / Self Care | Payer: BLUE CROSS/BLUE SHIELD | Source: Ambulatory Visit | Attending: Otolaryngology | Admitting: Otolaryngology

## 2014-08-19 ENCOUNTER — Other Ambulatory Visit: Payer: Self-pay | Admitting: Internal Medicine

## 2014-08-19 DIAGNOSIS — R220 Localized swelling, mass and lump, head: Secondary | ICD-10-CM

## 2014-08-19 DIAGNOSIS — R221 Localized swelling, mass and lump, neck: Principal | ICD-10-CM

## 2014-08-19 DIAGNOSIS — R599 Enlarged lymph nodes, unspecified: Secondary | ICD-10-CM

## 2014-08-19 DIAGNOSIS — R609 Edema, unspecified: Secondary | ICD-10-CM

## 2014-08-19 DIAGNOSIS — D490 Neoplasm of unspecified behavior of digestive system: Secondary | ICD-10-CM

## 2014-08-20 ENCOUNTER — Telehealth: Payer: Self-pay | Admitting: Internal Medicine

## 2014-08-20 MED ORDER — HYDROCODONE-ACETAMINOPHEN 5-325 MG PO TABS: 1.0000 | ORAL_TABLET | Freq: Four times a day (QID) | ORAL | Status: DC | PRN

## 2014-08-20 NOTE — Telephone Encounter (Signed)
Patient called to request refill on Hydrocodone. 

## 2014-08-20 NOTE — Telephone Encounter (Signed)
Done hardcopy to cindy  

## 2014-08-21 ENCOUNTER — Other Ambulatory Visit (HOSPITAL_COMMUNITY): Payer: Self-pay | Admitting: Otolaryngology

## 2014-08-21 DIAGNOSIS — R221 Localized swelling, mass and lump, neck: Principal | ICD-10-CM

## 2014-08-21 DIAGNOSIS — D490 Neoplasm of unspecified behavior of digestive system: Secondary | ICD-10-CM

## 2014-08-21 DIAGNOSIS — R609 Edema, unspecified: Secondary | ICD-10-CM

## 2014-08-21 DIAGNOSIS — R220 Localized swelling, mass and lump, head: Secondary | ICD-10-CM

## 2014-08-27 ENCOUNTER — Encounter (HOSPITAL_COMMUNITY): Payer: Self-pay | Admitting: Pharmacy Technician

## 2014-08-28 ENCOUNTER — Other Ambulatory Visit: Payer: Self-pay | Admitting: Radiology

## 2014-08-29 ENCOUNTER — Ambulatory Visit (HOSPITAL_COMMUNITY)
Admission: RE | Admit: 2014-08-29 | Discharge: 2014-08-29 | Disposition: A | Discharge status: Home / Self Care | Payer: BLUE CROSS/BLUE SHIELD | Source: Ambulatory Visit | Attending: Otolaryngology | Admitting: Otolaryngology

## 2014-08-29 DIAGNOSIS — R221 Localized swelling, mass and lump, neck: Secondary | ICD-10-CM

## 2014-08-29 DIAGNOSIS — D490 Neoplasm of unspecified behavior of digestive system: Secondary | ICD-10-CM

## 2014-08-29 DIAGNOSIS — R609 Edema, unspecified: Secondary | ICD-10-CM

## 2014-08-29 DIAGNOSIS — K116 Mucocele of salivary gland: Secondary | ICD-10-CM | POA: Insufficient documentation

## 2014-08-29 DIAGNOSIS — R220 Localized swelling, mass and lump, head: Secondary | ICD-10-CM

## 2014-08-29 MED ORDER — LIDOCAINE HCL (PF) 1 % IJ SOLN
INTRAMUSCULAR | Status: AC
  Filled 2014-08-29: qty 10

## 2014-08-29 NOTE — Procedures (Signed)
Interventional Radiology Procedure Note  Procedure: US guided aspiration of left parotid cyst.  3-4 cc of brown thin fluid removed.    Complications: No immediate Recommendations:  - Ok to shower   -  Follow up lab exam - Routine care   Signed,  Dulcy Fanny. Earleen Newport, DO

## 2014-09-02 ENCOUNTER — Encounter: Payer: Self-pay | Admitting: Internal Medicine

## 2014-09-03 ENCOUNTER — Other Ambulatory Visit: Payer: Self-pay | Admitting: *Deleted

## 2014-09-03 MED ORDER — OLMESARTAN MEDOXOMIL 20 MG PO TABS: 20.0000 mg | ORAL_TABLET | Freq: Every day | ORAL | Status: DC

## 2014-09-03 NOTE — Telephone Encounter (Signed)
Requesting refill on his benicar...[name]

## 2014-09-25 ENCOUNTER — Other Ambulatory Visit: Payer: Self-pay | Admitting: Internal Medicine

## 2014-10-14 ENCOUNTER — Telehealth: Payer: Self-pay | Admitting: Internal Medicine

## 2014-10-14 MED ORDER — MELOXICAM 15 MG PO TABS: 15.0000 mg | ORAL_TABLET | Freq: Every day | ORAL | Status: DC

## 2014-10-14 NOTE — Telephone Encounter (Signed)
Sent refill on meloxicam. Did advise if he think he is having gout flare-up will need f/u appt...[name]

## 2014-10-14 NOTE — Telephone Encounter (Signed)
Patient needs refill for meloxicam (MOBIC) 15 MG tablet [[id number]. He feels his gout is coming back on his left big toe. And maybe needs another shot. Please give him a call. [facility] [place]

## 2014-10-15 ENCOUNTER — Encounter: Payer: Self-pay | Admitting: Internal Medicine

## 2014-10-15 ENCOUNTER — Ambulatory Visit (INDEPENDENT_AMBULATORY_CARE_PROVIDER_SITE_OTHER): Payer: BLUE CROSS/BLUE SHIELD | Admitting: Internal Medicine

## 2014-10-15 ENCOUNTER — Telehealth: Payer: Self-pay | Admitting: *Deleted

## 2014-10-15 VITALS — BP 122/86 | HR 96 | Temp 99.1°F | Resp 18 | Ht 74.0 in | Wt 360.1 lb

## 2014-10-15 DIAGNOSIS — I10 Essential (primary) hypertension: Secondary | ICD-10-CM

## 2014-10-15 DIAGNOSIS — E119 Type 2 diabetes mellitus without complications: Secondary | ICD-10-CM | POA: Diagnosis not present

## 2014-10-15 DIAGNOSIS — M109 Gout, unspecified: Secondary | ICD-10-CM | POA: Diagnosis not present

## 2014-10-15 MED ORDER — PREDNISONE 10 MG PO TABS: ORAL_TABLET | ORAL | Status: DC

## 2014-10-15 MED ORDER — HYDROCODONE-ACETAMINOPHEN 5-325 MG PO TABS: 1.0000 | ORAL_TABLET | Freq: Four times a day (QID) | ORAL | Status: DC | PRN

## 2014-10-15 MED ORDER — METHYLPREDNISOLONE ACETATE 80 MG/ML IJ SUSP
80.0000 mg | Freq: Once | INTRAMUSCULAR | Status: AC
  Administered 2014-10-15: 80 mg via INTRAMUSCULAR

## 2014-10-15 MED ORDER — INDOMETHACIN 50 MG PO CAPS: ORAL_CAPSULE | ORAL | Status: DC

## 2014-10-15 NOTE — Assessment & Plan Note (Signed)
stable overall by history and exam, recent data reviewed with pt, and pt to continue medical treatment as before,  to f/u any worsening symptoms or concerns Lab Results  Component Value Date   HGBA1C 6.5 05/28/2014

## 2014-10-15 NOTE — Assessment & Plan Note (Signed)
Mod to severe flare as before, for depomedrol, predpac asd, later for indocin prn recurrence, declines daily allopurinol for now.  to f/u any worsening symptoms or concerns

## 2014-10-15 NOTE — Patient Instructions (Signed)
You had the steroid shot today  Please take all new medication as prescribed - the prednisone for now, then the indocin in the future if needed for the gout trying to come back, and pain medication  Please call if you keep having recurrent attacks, to start the allopurinol  Please continue all other medications as before, and refills have been done if requested.  Please have the pharmacy call with any other refills you may need.  Please continue your efforts at being more active, low cholesterol diet, and weight control.  Please keep your appointments with your specialists as you may have planned

## 2014-10-15 NOTE — Progress Notes (Signed)
Subjective:    Patient ID: [name], male    DOB: 1965/11/08, 49 y.o.   MRN: [id number]  HPI  Here with 2 days acute onset left great toe severe pain/red/swelling wihtout trauma or fever, has hx of same may 2016 though not as severe.  Wearing slipper today, can barely walk.  Did well last time with steroid tx and no further symptoms until today. Nothing makes better or worse  Pt denies chest pain, increasing sob or doe, wheezing, orthopnea, PND, increased LE swelling, palpitations, dizziness or syncope.  Pt denies new neurological symptoms such as new headache, or facial or extremity weakness or numbness.  Pt denies polydipsia, polyuria, or low sugar episode.   Pt denies new neurological symptoms such as new headache, or facial or extremity weakness or numbness.   Pt states overall good compliance with meds, mostly trying to follow appropriate diet, with wt overall stable, but still needs to f/u with gen surgeon regarding type of gastric bypass.  Past Medical History  Diagnosis Date  . Hyperlipidemia   . HTN (hypertension)   . Hyperglycemia   . Gallstones   . Arthritis   . DJD (degenerative joint disease)   . Obesity   . Diabetes mellitus    Past Surgical History  Procedure Laterality Date  . Repair knee ligament      Left  . Back cyst excision      x2  . Tennis elbow surgery, nerve entrapment      x2 right  . Shoulder surgery, reconstruction of joint      Left x 3, Last 02/2007  . Joint replacement  01/26/2011    right knee  . Right foot      BONES STRAIGHTENED OUT   2006  . Cholecystectomy  08/11/2011    Procedure: LAPAROSCOPIC CHOLECYSTECTOMY WITH INTRAOPERATIVE CHOLANGIOGRAM;  Surgeon: Judieth Keens, DO;  Location: [facility];  Service: General;  Laterality: N/A;  Laparoscopic cholecystectomy with cholangiogram.  . Knee arthroscopy      x 2 left  . Knee arthroscopy      x 2 right    reports that he has never smoked. He has never used smokeless tobacco. He reports that he  drinks alcohol. He reports that he does not use illicit drugs. family history includes Colon cancer (age of onset: 70) in his brother; Ovarian cancer in his maternal grandmother; Sarcoidosis in his mother. There is no history of Cancer, Heart disease, or Kidney disease. No Known Allergies Current Outpatient Prescriptions on File Prior to Visit  Medication Sig Dispense Refill  . BENICAR 20 MG tablet TAKE 1 TABLET (20 MG TOTAL) BY MOUTH DAILY. 90 tablet 2  . cromolyn (OPTICROM) 4 % ophthalmic solution Place 1 drop into both eyes as needed (for allergies).    [name] esomeprazole (NEXIUM) 40 MG packet Take 40 mg by mouth daily before breakfast. 90 each 0  . lovastatin (MEVACOR) 40 MG tablet Take 1 tablet (40 mg total) by mouth at bedtime. 90 tablet 3  . meloxicam (MOBIC) 15 MG tablet Take 1 tablet (15 mg total) by mouth daily. 30 tablet 5  . metFORMIN (GLUCOPHAGE) 500 MG tablet TAKE 1 TABLET (500 MG TOTAL) BY MOUTH 2 (TWO) TIMES DAILY WITH A MEAL. 180 tablet 2  . olmesartan (BENICAR) 20 MG tablet Take 1 tablet (20 mg total) by mouth daily. 90 tablet 2  . testosterone (ANDROGEL) 50 MG/5GM (1%) GEL Place 5 g onto the skin daily. 450 g 1  .  vardenafil (LEVITRA) 20 MG tablet Take 1 tablet (20 mg total) by mouth daily as needed for erectile dysfunction. 6 tablet 5   No current facility-administered medications on file prior to visit.    Review of Systems  Constitutional: Negative for unusual diaphoresis or night sweats HENT: Negative for ringing in ear or discharge Eyes: Negative for double vision or worsening visual disturbance.  Respiratory: Negative for choking and stridor.   Gastrointestinal: Negative for vomiting or other signifcant bowel change Genitourinary: Negative for hematuria or change in urine volume.  Musculoskeletal: Negative for other MSK pain or swelling Skin: Negative for color change and worsening wound.  Neurological: Negative for tremors and numbness other than noted    Psychiatric/Behavioral: Negative for decreased concentration or agitation other than above       Objective:   Physical Exam BP 122/86 mmHg  Pulse 96  Temp(Src) 99.1 F (37.3 C) (Oral)  Resp 18  Ht 6\' 2"  (1.88 m)  Wt 360 lb 1.3 oz (163.331 kg)  BMI 46.21 kg/m2  SpO2 96% VS noted,  Constitutional: Pt appears in no significant distress HENT: Head: NCAT.  Right Ear: External ear normal.  Left Ear: External ear normal.  Eyes: . Pupils are equal, round, and reactive to light. Conjunctivae and EOM are normal Neck: Normal range of motion. Neck supple.  Cardiovascular: Normal rate and regular rhythm.   Pulmonary/Chest: Effort normal and breath sounds without rales or wheezing.  Left first MTP with 3+ red/tender/swelling o/w foot neurovasc intact Neurological: Pt is alert. Not confused , motor grossly intact Skin: Skin is warm. No rash, no LE edema Psychiatric: Pt behavior is normal. No agitation.     Assessment & Plan:

## 2014-10-15 NOTE — Assessment & Plan Note (Signed)
stable overall by history and exam, recent data reviewed with pt, and pt to continue medical treatment as before,  to f/u any worsening symptoms or concerns BP Readings from Last 3 Encounters:  10/15/14 122/86  08/06/14 108/70  05/28/14 108/82

## 2014-10-15 NOTE — Telephone Encounter (Signed)
[facility] - Client TELEPHONE ADVICE RECORD [facility] Patient Name: [name] Gender: Male DOB: 31-Mar-1966 Age: 49 Y 10 M 18 D Return Phone Number: [phone] (Primary) Address: [place]. City/State/Zip: [place] [address] Client [facility] - Client Client Site [facility] - Day Physician Cathlean Cower Contact Type Call Call Type Triage / Clinical Relationship To Patient Self Appointment Disposition EMR Patient Reports Appointment Already Scheduled Info pasted into Epic No Return Phone Number [phone] (Primary) Chief Complaint Foot Pain Initial Comment Caller states he is experiencing flare-up of gout. He is experiencing pain in his foot and can hardly walk. PreDisposition Home Care Nurse Assessment Nurse: Denyse Amass, RN, Benjamine Mola Date/Time Eilene Ghazi Time): 10/14/2014 4:51:09 PM Confirm and document reason for call. If symptomatic, describe symptoms. ---Patient states he has right great toe pain. Has gout. Does not take medication for gout. States he has a bunion on his right foot. Has the patient traveled out of the country within the last 30 days? ---Not Applicable Does the patient require triage? ---Yes Related visit to physician within the last 2 weeks? ---No Does the PT have any chronic conditions? (i.e. diabetes, asthma, etc.) ---Yes List chronic conditions. ---Gout Diabetic HTN Arthritis GERD Guidelines Guideline Title Affirmed Question Affirmed Notes Nurse Date/Time Eilene Ghazi Time) Foot Pain [1] MODERATE pain (e.g., interferes with normal activities, limping) AND [2] present > 3 days Denyse Amass, RNBenjamine Mola 10/14/2014 4:54:57 PM Disp. Time Eilene Ghazi Time) Disposition Final User 10/14/2014 4:58:59 PM See PCP When Office is Open (within 3 days) Yes [place], RN, Benjamine Mola PLEASE NOTE: All timestamps contained within this report are represented as [place] Standard  Time. CONFIDENTIALTY NOTICE: This fax transmission is intended only for the addressee. It contains information that is legally privileged, confidential or otherwise protected from use or disclosure. If you are not the intended recipient, you are strictly prohibited from reviewing, disclosing, copying using or disseminating any of this information or taking any action in reliance on or regarding this information. If you have received this fax in error, please notify us immediately by telephone so that we can arrange for its return to [place]. Phone: [phone], Toll-Free: [phone], Fax: [phone] Page: 2 of 2 Call Id: [id number] Caller Understands: Yes Disagree/Comply: Comply Care Advice Given Per Guideline SEE PCP WITHIN 3 DAYS: You need to be examined within 2 or 3 days. Call your doctor during regular office hours and make an appointment. (Note: if office will be open tomorrow, tell caller to call then, not in 3 days). CALL BACK IF: * Fever occurs * You become worse. After Care Instructions Given Call Event Type User D

## 2014-12-01 ENCOUNTER — Other Ambulatory Visit: Payer: Self-pay | Admitting: Internal Medicine

## 2015-01-21 ENCOUNTER — Other Ambulatory Visit: Payer: Self-pay | Admitting: Internal Medicine

## 2015-01-24 ENCOUNTER — Telehealth: Payer: Self-pay | Admitting: Internal Medicine

## 2015-01-24 DIAGNOSIS — Z0000 Encounter for general adult medical examination without abnormal findings: Secondary | ICD-10-CM

## 2015-01-24 NOTE — Telephone Encounter (Signed)
Pt Called and would like referral to get colonoscopy?  Can we put order in?

## 2015-01-30 NOTE — Telephone Encounter (Signed)
Referral done

## 2015-02-03 ENCOUNTER — Encounter: Payer: Self-pay | Admitting: Internal Medicine

## 2015-03-26 ENCOUNTER — Telehealth: Payer: Self-pay | Admitting: *Deleted

## 2015-03-26 ENCOUNTER — Other Ambulatory Visit: Payer: Self-pay | Admitting: Internal Medicine

## 2015-03-26 DIAGNOSIS — Z1211 Encounter for screening for malignant neoplasm of colon: Secondary | ICD-10-CM

## 2015-03-26 NOTE — Telephone Encounter (Signed)
Patient is coming in for pre-visit for a direct screening colonoscopy. At last office visit patient's weight was 360 lbs. If patient's weight is over 446 (per [facility]) would you like patient to have office visit first or direct hospital colonoscopy ok? Please advise, thanks, Teniqua Marron,pv

## 2015-03-26 NOTE — Telephone Encounter (Signed)
Will set up hospital case for Dr Celesta Aver hospital week in Oct.

## 2015-03-26 NOTE — Telephone Encounter (Signed)
Direct and no ov required MAC please

## 2015-03-27 NOTE — Telephone Encounter (Signed)
Spoke with Sharee Pimple at [facility] , colonoscopy appt made for 05/08/15 at 12:30pm and cancelled appt for colonoscopy in [place]. Patient will be given prep instructions at pre-visit.

## 2015-03-27 NOTE — Telephone Encounter (Signed)
Spoke with patient and informed him of the change in his date/time of colonoscopy.

## 2015-03-27 NOTE — Telephone Encounter (Signed)
Left message for patient to call me back regarding change in appointment date and location.

## 2015-03-31 ENCOUNTER — Ambulatory Visit (AMBULATORY_SURGERY_CENTER): Payer: Self-pay | Admitting: *Deleted

## 2015-03-31 VITALS — Ht 74.0 in | Wt 356.6 lb

## 2015-03-31 DIAGNOSIS — Z1211 Encounter for screening for malignant neoplasm of colon: Secondary | ICD-10-CM

## 2015-03-31 NOTE — Progress Notes (Signed)
Patient denies any allergies to eggs or soy. Patient denies any problems with anesthesia/sedation. Patient denies any oxygen use at home and does not take any diet/weight loss medications. EMMI education assisgned to patient on colonoscopy, this was explained and instructions given to patient. 

## 2015-04-14 ENCOUNTER — Encounter: Payer: Self-pay | Admitting: Internal Medicine

## 2015-05-02 ENCOUNTER — Encounter (HOSPITAL_COMMUNITY): Payer: Self-pay | Admitting: *Deleted

## 2015-05-06 ENCOUNTER — Telehealth: Payer: Self-pay

## 2015-05-06 NOTE — Telephone Encounter (Signed)
-----   Message from Gatha Mayer, MD sent at 05/06/2015 11:08 AM EDT ----- Regarding: change colonoscopy to Dr. Ardis Hughs on Thursday at [facility] I am doing office Thursday PM and I do not think that the time he is scheduled for me to do colonoscopy will work.  Was supposed to be 1230 which would have been ok but is saying 110.  Dr. Ardis Hughs is able and willing to do this patient so I would like him to be contacted and we could follow Dr. Ardis Hughs' last outpatient vs doing earlier in day before his first (would be 0800).  Please explain to patient [name] or Dellene Mcgroarty) and let him know I am sorry but I trusted Dr. Ardis Hughs to do my colonoscopy!

## 2015-05-06 NOTE — Telephone Encounter (Signed)
Pt has been rescheduled to Dr Ardis Hughs at 11 am and he was called and re instructed.  He was advised to call with any questions or concerns with the new time and his instructions.  The pt agreed and thanked me for calling.

## 2015-05-07 ENCOUNTER — Telehealth: Payer: Self-pay | Admitting: Internal Medicine

## 2015-05-07 NOTE — Telephone Encounter (Signed)
Patient called and stated he ate a full breakfast this morning.  He is advised ok to keep the appt for tomorrow, but remain on a clear liquid diet the remainder of the day.  He does not want to come tomorrow.  He wants to reschedule to January.  He is notified that I do not have a schedule in January and that he should call back in [facility] November.

## 2015-05-08 ENCOUNTER — Ambulatory Visit (HOSPITAL_COMMUNITY)
Admission: RE | Admit: 2015-05-08 | Payer: BLUE CROSS/BLUE SHIELD | Source: Ambulatory Visit | Admitting: Internal Medicine

## 2015-05-08 SURGERY — COLONOSCOPY WITH PROPOFOL
Anesthesia: Monitor Anesthesia Care

## 2015-05-14 ENCOUNTER — Other Ambulatory Visit: Payer: Self-pay | Admitting: Internal Medicine

## 2015-06-11 ENCOUNTER — Telehealth: Payer: Self-pay | Admitting: Internal Medicine

## 2015-06-11 DIAGNOSIS — F411 Generalized anxiety disorder: Secondary | ICD-10-CM

## 2015-06-11 NOTE — Telephone Encounter (Signed)
Patient is requesting a referral to psychiatry. He does not have one in mind at this point. Suspected PTSD.

## 2015-06-11 NOTE — Telephone Encounter (Signed)
Referral done

## 2015-06-20 ENCOUNTER — Other Ambulatory Visit: Payer: Self-pay | Admitting: Internal Medicine

## 2015-07-08 ENCOUNTER — Encounter (HOSPITAL_COMMUNITY): Payer: Self-pay | Admitting: Licensed Clinical Social Worker

## 2015-07-08 ENCOUNTER — Encounter: Payer: Self-pay | Admitting: Internal Medicine

## 2015-07-08 ENCOUNTER — Ambulatory Visit (INDEPENDENT_AMBULATORY_CARE_PROVIDER_SITE_OTHER): Payer: BLUE CROSS/BLUE SHIELD | Admitting: Licensed Clinical Social Worker

## 2015-07-08 ENCOUNTER — Other Ambulatory Visit (INDEPENDENT_AMBULATORY_CARE_PROVIDER_SITE_OTHER): Payer: BLUE CROSS/BLUE SHIELD

## 2015-07-08 ENCOUNTER — Telehealth: Payer: Self-pay | Admitting: Internal Medicine

## 2015-07-08 ENCOUNTER — Ambulatory Visit (INDEPENDENT_AMBULATORY_CARE_PROVIDER_SITE_OTHER): Payer: BLUE CROSS/BLUE SHIELD | Admitting: Internal Medicine

## 2015-07-08 VITALS — BP 106/74 | HR 72 | Temp 97.8°F | Ht 74.0 in | Wt 353.0 lb

## 2015-07-08 DIAGNOSIS — I10 Essential (primary) hypertension: Secondary | ICD-10-CM | POA: Diagnosis not present

## 2015-07-08 DIAGNOSIS — Z23 Encounter for immunization: Secondary | ICD-10-CM | POA: Diagnosis not present

## 2015-07-08 DIAGNOSIS — E119 Type 2 diabetes mellitus without complications: Secondary | ICD-10-CM

## 2015-07-08 DIAGNOSIS — F411 Generalized anxiety disorder: Secondary | ICD-10-CM | POA: Diagnosis not present

## 2015-07-08 DIAGNOSIS — F341 Dysthymic disorder: Secondary | ICD-10-CM | POA: Diagnosis not present

## 2015-07-08 DIAGNOSIS — E291 Testicular hypofunction: Secondary | ICD-10-CM

## 2015-07-08 DIAGNOSIS — F4310 Post-traumatic stress disorder, unspecified: Secondary | ICD-10-CM | POA: Diagnosis not present

## 2015-07-08 DIAGNOSIS — F329 Major depressive disorder, single episode, unspecified: Secondary | ICD-10-CM | POA: Diagnosis not present

## 2015-07-08 DIAGNOSIS — E785 Hyperlipidemia, unspecified: Secondary | ICD-10-CM | POA: Diagnosis not present

## 2015-07-08 DIAGNOSIS — Z0000 Encounter for general adult medical examination without abnormal findings: Secondary | ICD-10-CM | POA: Diagnosis not present

## 2015-07-08 DIAGNOSIS — F32A Depression, unspecified: Secondary | ICD-10-CM | POA: Insufficient documentation

## 2015-07-08 HISTORY — DX: Depression, unspecified: F32.A

## 2015-07-08 HISTORY — DX: Testicular hypofunction: E29.1

## 2015-07-08 LAB — CBC WITH DIFFERENTIAL/PLATELET
BASOS PCT: 0.4 % (ref 0.0–3.0)
Basophils Absolute: 0 10*3/uL (ref 0.0–0.1)
EOS ABS: 0 10*3/uL (ref 0.0–0.7)
EOS PCT: 0.7 % (ref 0.0–5.0)
HCT: 43.3 % (ref 39.0–52.0)
Hemoglobin: 13.7 g/dL (ref 13.0–17.0)
Lymphocytes Relative: 35.5 % (ref 12.0–46.0)
Lymphs Abs: 2.4 10*3/uL (ref 0.7–4.0)
MCHC: 31.6 g/dL (ref 30.0–36.0)
MCV: 80.8 fl (ref 78.0–100.0)
MONO ABS: 0.6 10*3/uL (ref 0.1–1.0)
Monocytes Relative: 9.3 % (ref 3.0–12.0)
NEUTROS ABS: 3.6 10*3/uL (ref 1.4–7.7)
Neutrophils Relative %: 54.1 % (ref 43.0–77.0)
PLATELETS: 222 10*3/uL (ref 150.0–400.0)
RBC: 5.36 Mil/uL (ref 4.22–5.81)
RDW: 14.6 % (ref 11.5–15.5)
WBC: 6.7 10*3/uL (ref 4.0–10.5)

## 2015-07-08 LAB — HEPATIC FUNCTION PANEL
ALK PHOS: 54 U/L (ref 39–117)
ALT: 25 U/L (ref 0–53)
AST: 15 U/L (ref 0–37)
Albumin: 4.4 g/dL (ref 3.5–5.2)
BILIRUBIN DIRECT: 0.2 mg/dL (ref 0.0–0.3)
BILIRUBIN TOTAL: 0.8 mg/dL (ref 0.2–1.2)
Total Protein: 7.5 g/dL (ref 6.0–8.3)

## 2015-07-08 LAB — LIPID PANEL
CHOL/HDL RATIO: 4
Cholesterol: 166 mg/dL (ref 0–200)
HDL: 42 mg/dL (ref >39.00)
LDL Cholesterol: 111 mg/dL — ABNORMAL HIGH (ref 0–99)
NONHDL: 123.74
TRIGLYCERIDES: 65 mg/dL (ref 0.0–149.0)
VLDL: 13 mg/dL (ref 0.0–40.0)

## 2015-07-08 LAB — BASIC METABOLIC PANEL
BUN: 11 mg/dL (ref 6–23)
CHLORIDE: 103 meq/L (ref 96–112)
CO2: 31 mEq/L (ref 19–32)
Calcium: 9.8 mg/dL (ref 8.4–10.5)
Creatinine, Ser: 0.82 mg/dL (ref 0.40–1.50)
GFR: 127.58 mL/min (ref >60.00)
Glucose, Bld: 107 mg/dL — ABNORMAL HIGH (ref 70–99)
POTASSIUM: 4.5 meq/L (ref 3.5–5.1)
SODIUM: 140 meq/L (ref 135–145)

## 2015-07-08 LAB — URINALYSIS, ROUTINE W REFLEX MICROSCOPIC
BILIRUBIN URINE: NEGATIVE
HGB URINE DIPSTICK: NEGATIVE
Ketones, ur: NEGATIVE
Leukocytes, UA: NEGATIVE
Nitrite: NEGATIVE
RBC / HPF: NONE SEEN (ref 0–?)
Specific Gravity, Urine: 1.02 (ref 1.000–1.030)
Total Protein, Urine: NEGATIVE
Urine Glucose: NEGATIVE
Urobilinogen, UA: 0.2 (ref 0.0–1.0)
WBC UA: NONE SEEN (ref 0–?)
pH: 6.5 (ref 5.0–8.0)

## 2015-07-08 LAB — PSA: PSA: 0.22 ng/mL (ref 0.10–4.00)

## 2015-07-08 LAB — TESTOSTERONE: TESTOSTERONE: 231.77 ng/dL — AB (ref 300.00–890.00)

## 2015-07-08 LAB — TSH: TSH: 1.26 u[IU]/mL (ref 0.35–4.50)

## 2015-07-08 LAB — HEMOGLOBIN A1C: Hgb A1c MFr Bld: 6.3 % (ref 4.6–6.5)

## 2015-07-08 LAB — MICROALBUMIN / CREATININE URINE RATIO
CREATININE, U: 208.5 mg/dL
MICROALB UR: 0.7 mg/dL (ref 0.0–1.9)
MICROALB/CREAT RATIO: 0.3 mg/g (ref 0.0–30.0)

## 2015-07-08 MED ORDER — OLMESARTAN MEDOXOMIL 20 MG PO TABS: ORAL_TABLET | ORAL | Status: DC

## 2015-07-08 MED ORDER — HYDROCODONE-ACETAMINOPHEN 5-325 MG PO TABS: 1.0000 | ORAL_TABLET | Freq: Four times a day (QID) | ORAL | Status: DC | PRN

## 2015-07-08 MED ORDER — TESTOSTERONE 50 MG/5GM (1%) TD GEL: 5.0000 g | Freq: Every day | TRANSDERMAL | Status: DC

## 2015-07-08 MED ORDER — METFORMIN HCL 500 MG PO TABS: ORAL_TABLET | ORAL | Status: DC

## 2015-07-08 NOTE — Progress Notes (Signed)
Pre visit review using our clinic review tool, if applicable. No additional management support is needed unless otherwise documented below in the visit note. 

## 2015-07-08 NOTE — Telephone Encounter (Signed)
Labs normal, pt advised via personal VM

## 2015-07-08 NOTE — Assessment & Plan Note (Signed)
stable overall by history and exam, recent data reviewed with pt, and pt to continue medical treatment as before,  to f/u any worsening symptoms or concerns Consider statin for LDL > 70

## 2015-07-08 NOTE — Patient Instructions (Addendum)
You had the flu shot today  You are given the handicap parking application  Please continue all other medications as before, and refills have been done if requested including the androgel  Please call if you change your mind about taking the lexapro 10 mg per day  Please have the pharmacy call with any other refills you may need.  Please continue your efforts at being more active, low cholesterol diet, and weight control.  You are otherwise up to date with prevention measures today.  Please keep your appointments with your specialists as you may have planned  You will be contacted regarding the referral for: colonoscopy  Please go to the LAB in the Basement (turn left off the elevator) for the tests to be done today  You will be contacted by phone if any changes need to be made immediately.  Otherwise, you will receive a letter about your results with an explanation, but please check with MyChart first.  Please remember to sign up for MyChart if you have not done so, as this will be important to you in the future with finding out test results, communicating by private email, and scheduling acute appointments online when needed.  Please return in 6 months, or sooner if needed, with Lab testing done 3-5 days before

## 2015-07-08 NOTE — Assessment & Plan Note (Signed)
Mild worsening, consider lexapro 10 qd but declines, to start counseling later today

## 2015-07-08 NOTE — Assessment & Plan Note (Signed)
Also for testosterone level, and re-start androgen tx, may help with depression as well

## 2015-07-08 NOTE — Progress Notes (Signed)
Subjective:    Patient ID: [name], male    DOB: 01-16-1966, 49 y.o.   MRN: [id number]  HPI  Here for wellness and f/u;  Overall doing ok;  Pt denies Chest pain, worsening SOB, DOE, wheezing, orthopnea, PND, worsening LE edema, palpitations, dizziness or syncope.  Pt denies neurological change such as new headache, facial or extremity weakness.  Pt denies polydipsia, polyuria, or low sugar symptoms. Pt states overall good compliance with treatment and medications, good tolerability, and has been trying to follow appropriate diet.  Pt denies worsening depressive symptoms, suicidal ideation or panic. No fever, night sweats, wt loss, loss of appetite, or other constitutional symptoms.  Pt states good ability with ADL's, has low fall risk, home safety reviewed and adequate, no other significant changes in hearing or vision, and only occasionally active with exercise.  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   Lost interest in bariatric surgury sleeve procedure after 2 classes.  Hard to exercise due to ongoing bilat knee DJD now s/p right knee TKR, and due for left this month except he has delayed the surgury to later, wants to cont the shots for now.  Asks for androgel refill Past Medical History  Diagnosis Date  . Hyperlipidemia   . HTN (hypertension)   . Hyperglycemia   . Gallstones   . Arthritis   . DJD (degenerative joint disease)   . Obesity   . Diabetes mellitus ([place])   . Gout   . GERD (gastroesophageal reflux disease)    Past Surgical History  Procedure Laterality Date  . Repair knee ligament      Left  . Back cyst excision      x2  . Tennis elbow surgery, nerve entrapment      x2 right  . Shoulder surgery, reconstruction of joint      Left x 3, Last 02/2007  . Joint  replacement  01/26/2011    right knee  . Right foot      BONES STRAIGHTENED OUT   2006  . Cholecystectomy  08/11/2011    Procedure: LAPAROSCOPIC CHOLECYSTECTOMY WITH INTRAOPERATIVE CHOLANGIOGRAM;  Surgeon: Judieth Keens, DO;  Location: [facility];  Service: General;  Laterality: N/A;  Laparoscopic cholecystectomy with cholangiogram.  . Knee arthroscopy      x 2 left  . Knee arthroscopy      x 2 right    reports that he has never smoked. He has never used smokeless tobacco. He reports that he drinks alcohol. He reports that he does not use illicit drugs. family history includes Colon cancer (age of onset: 54) in his brother; Ovarian cancer in his maternal grandmother; Sarcoidosis in his mother. There is no history of Cancer, Heart disease, or Kidney disease. No Known Allergies   Review of Systems Constitutional: Negative for increased diaphoresis, other activity, appetite or siginficant weight change other than noted HENT: Negative for worsening hearing loss, ear pain, facial swelling, mouth sores and neck stiffness.   Eyes: Negative for other worsening pain, redness or visual disturbance.  Respiratory: Negative for shortness of breath and wheezing  Cardiovascular: Negative for chest pain and palpitations.  Gastrointestinal: Negative for diarrhea, blood in stool, abdominal distention or other pain Genitourinary: Negative for hematuria, flank pain  or change in urine volume.  Musculoskeletal: Negative for myalgias or other joint complaints.  Skin: Negative for color change and wound or drainage.  Neurological: Negative for syncope and numbness. other than noted Hematological: Negative for adenopathy. or other swelling Psychiatric/Behavioral: Negative for hallucinations, SI, self-injury, decreased concentration or other worsening agitation.      Objective:   Physical Exam Blood pressure 106/74, pulse 72, temperature 97.8 F (36.6 C), temperature source Oral, height 6\' 2"  (1.88 m), weight  353 lb (160.12 kg), SpO2 98 %. VS noted,  Constitutional: Pt is oriented to person, place, and time. Appears well-developed and well-nourished, in no significant distress Head: Normocephalic and atraumatic.  Right Ear: External ear normal.  Left Ear: External ear normal.  Nose: Nose normal.  Mouth/Throat: Oropharynx is clear and moist.  Eyes: Conjunctivae and EOM are normal. Pupils are equal, round, and reactive to light.  Neck: Normal range of motion. Neck supple. No JVD present. No tracheal deviation present or significant neck LA or mass Cardiovascular: Normal rate, regular rhythm, normal heart sounds and intact distal pulses.   Pulmonary/Chest: Effort normal and breath sounds without rales or wheezing  Abdominal: Soft. Bowel sounds are normal. NT. No HSM  Musculoskeletal: Normal range of motion. Exhibits no edema.  Lymphadenopathy:  Has no cervical adenopathy.  Neurological: Pt is alert and oriented to person, place, and time. Pt has normal reflexes. No cranial nerve deficit. Motor grossly intact Skin: Skin is warm and dry. No rash noted.  Psychiatric:  Has mild dysphoric nervous mood and affect. Behavior is normal.  Lab Results  Component Value Date   WBC 8.8 05/28/2014   HGB 13.3 05/28/2014   HCT 42.3 05/28/2014   PLT 218.0 05/28/2014   GLUCOSE 123* 05/28/2014   CHOL 171 05/28/2014   TRIG 104.0 05/28/2014   HDL 37.50* 05/28/2014   LDLCALC 113* 05/28/2014   ALT 32 05/28/2014   AST 20 05/28/2014   NA 139 05/28/2014   K 4.1 05/28/2014   CL 106 05/28/2014   CREATININE 0.9 05/28/2014   BUN 17 05/28/2014   CO2 27 05/28/2014   TSH 1.76 05/28/2014   PSA 0.22 05/28/2014   INR 0.96 05/14/2011   HGBA1C 6.5 05/28/2014   MICROALBUR 1.1 05/28/2014       Assessment & Plan:

## 2015-07-08 NOTE — Addendum Note (Signed)
Addended by: Biagio Borg on: 07/08/2015 12:52 PM   Modules accepted: Miquel Dunn

## 2015-07-08 NOTE — Addendum Note (Signed)
Addended by: Biagio Borg on: 07/08/2015 10:40 AM   Modules accepted: Orders, SmartSet

## 2015-07-08 NOTE — Telephone Encounter (Signed)
Pt request lab result that was done 07/08/15. Pt state we send him a massage to my chart but he can not get into it. Please give him a call back  Phone # [phone]

## 2015-07-08 NOTE — Assessment & Plan Note (Addendum)
stable overall by history and exam, recent data reviewed with pt, and pt to continue medical treatment as before,  to f/u any worsening symptoms or concerns BP Readings from Last 3 Encounters:  07/08/15 106/74  10/15/14 122/86  08/06/14 108/70  ECG reviewed as per emr

## 2015-07-08 NOTE — Assessment & Plan Note (Signed)

## 2015-07-08 NOTE — Assessment & Plan Note (Signed)
stable overall by history and exam, recent data reviewed with pt, and pt to continue medical treatment as before,  to f/u any worsening symptoms or concerns Lab Results  Component Value Date   HGBA1C 6.5 05/28/2014    

## 2015-07-09 ENCOUNTER — Telehealth: Payer: Self-pay

## 2015-07-09 DIAGNOSIS — F411 Generalized anxiety disorder: Secondary | ICD-10-CM | POA: Insufficient documentation

## 2015-07-09 DIAGNOSIS — F4310 Post-traumatic stress disorder, unspecified: Secondary | ICD-10-CM | POA: Insufficient documentation

## 2015-07-09 DIAGNOSIS — F341 Dysthymic disorder: Secondary | ICD-10-CM | POA: Insufficient documentation

## 2015-07-09 NOTE — Telephone Encounter (Signed)
Spoke with patient and as he was at work he said he will have to call back.  When he calls back we need to set him up for a screening colon with MAC at [facility] for Dr Celesta Aver hospital week of March 6th and also make him a pre-visit appointment.  He has to be done at [facility] because his wt is over 350 LBS.

## 2015-07-09 NOTE — Progress Notes (Signed)
Comprehensive Clinical Assessment (CCA) Note  07/09/2015 [name] [id number]  Visit Diagnosis:      ICD-9-CM ICD-10-CM   1. PTSD (post-traumatic stress disorder) 309.81 F43.10   2. Dysthymia 300.4 F34.1   3. Generalized anxiety disorder 300.02 F41.1       CCA Part One  Part One has been completed on paper by the patient.  (See scanned document in Chart Review)  CCA Part Two A  Intake/Chief Complaint:  CCA Intake With Chief Complaint CCA Part Two Date: 07/08/15 CCA Part Two Time: [id number] Chief Complaint/Presenting Problem: Had been getting services at the [name] but hasn't been satisfied with treatment.  "I'm thinking I've got some PTSD going on.  I don't sleep.  My anger is a problem.  I go from 0 to 100 real quick.  My friends have backed away from me. I don't enjoy being around a lot of people. "  Believes his experience in the [name] changed him significantly.      Patients Currently Reported Symptoms/Problems: "I can't be the man that I need to be for my family.  I can't enjoy playing with my 33 year old grandson."   Individual's Strengths: "One thing I am good at is I try to be a good provider."   Individual's Preferences: "I want to get the fear out of me.  I want to function in society."   Type of Services Patient Feels Are Needed: Therapy  Mental Health Symptoms Depression:  Depression: Change in energy/activity, Difficulty Concentrating, Fatigue, Hopelessness, Increase/decrease in appetite, Irritability, Sleep (too much or little), Tearfulness, Worthlessness  Mania:  Mania: N/A  Anxiety:   Anxiety: Difficulty concentrating, Fatigue, Irritability, Restlessness, Sleep, Tension, Worrying  Psychosis:  Psychosis: N/A  Trauma:  Trauma: Difficulty staying/falling asleep, Irritability/anger, Re-experience of traumatic event, Avoids reminders of event, Detachment from others, Hypervigilance, Emotional numbing, Guilt/shame  Obsessions:  Obsessions: Absent  Compulsions:  Compulsions: N/A   Inattention:  Inattention: N/A  Hyperactivity/Impulsivity:  Hyperactivity/Impulsivity: N/A  Oppositional/Defiant Behaviors:  Oppositional/Defiant Behaviors: Easily annoyed, Angry  Borderline Personality:     Other Mood/Personality Symptoms:      Mental Status Exam Appearance and self-care  Stature:  Stature: Average  Weight:  Weight: Obese  Clothing:  Clothing: Casual  Grooming:  Grooming: Normal  Cosmetic use:  Cosmetic Use: None  Posture/gait:  Posture/Gait: Normal  Motor activity:  Motor Activity: Not Remarkable  Sensorium  Attention:  Attention: Normal  Concentration:  Concentration: Normal  Orientation:  Orientation: X5  Recall/memory:     Affect and Mood  Affect:  Affect: Anxious  Mood:  Mood: Anxious  Relating  Eye contact:  Eye Contact: Normal  Facial expression:  Facial Expression: Responsive  Attitude toward examiner:  Attitude Toward Examiner: Cooperative  Thought and Language  Speech flow: Speech Flow: Normal  Thought content:  Thought Content: Appropriate to mood and circumstances  Preoccupation:     Hallucinations:     Organization:     Transport planner of Knowledge:     Intelligence:     Abstraction:     Judgement:  Judgement: Fair  Art therapist:  Reality Testing: Adequate  Insight:  Insight: Fair  Decision Making:  Decision Making: Vacilates  Social Functioning  Social Maturity:  Social Maturity: Isolates  Social Judgement:  Social Judgement: Victimized  Stress  Stressors:  Stressors:  (Experiences  some chronic pain)  Coping Ability:  Coping Ability: Overwhelmed, Research officer, political party Deficits:     Supports:  Family and Psychosocial History: Family history Marital status: Divorced Divorced, when?: 10-12-92 Additional relationship information: Married for 6 years to [name].  "When I came home from the [place] I was a totally different person.  She was afraid." Does patient have children?: Yes How many children?: 1 How is patient's  relationship with their children?: Daughter, [name] (28) lives in [place].  Says the relationship "could be better."  She says he is hard to talk to.  "I try to talk to her every day."  Childhood History:  Childhood History By whom was/is the patient raised?: Both parents Additional childhood history information: Grew up in [place]. Description of patient's relationship with caregiver when they were a child: Good relationship with mom.  Relationship with dad was "fair"   Patient's description of current relationship with people who raised him/her: Both are deceased.  Mom in 1994-10-13.  Dad in February 2016.   How were you disciplined when you got in trouble as a child/adolescent?: My mother never whipped me.  Dad was the disciplinarian.  "I was a good child growing up." Does patient have siblings?: Yes Number of Siblings: 1 Description of patient's current relationship with siblings: Sister, [name] [id number])- distant relationship  Lives in [place].  Reports they were close growing up. 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) Has patient ever served in Recruitment consultant?: No Did You Receive Any Psychiatric Treatment/Services While in Passenger transport manager?: No Are There Guns or Other Weapons in [place]?: Yes Types of Guns/Weapons: a 45 handgun Are These Psychologist, educational?: Yes (Locked in a Paramedic)  Education: Education Last Grade Completed: 12 Did Teacher, adult education From [employer]?: Yes Did Physicist, medical?: No Did [place]?: No Did You Have Any Special Interests In School?: [place] and science Did You Have An Individualized Education Program (IIEP): No Did You Have Any Difficulty At School?: No  Religion: Religion/Spirituality Are You A Religious Person?: Yes What is Your Religious Affiliation?: Psychologist, forensic (Member of [employer] in [place])  Leisure/Recreation: Leisure / Recreation Leisure and Hobbies: Works up to 60 hrs a week.  Generally stays at home otherwise.  Tries to keep busy with household tasks.  Lost interest in sports.  Used to play football and basketball.    Exercise/Diet: Exercise/Diet Do You Exercise?: No (Has chronic knee pain) Have You Gained or Lost A Significant Amount of Weight in the Past Six Months?: No Do You Follow a Special Diet?: No Do You Have Any Trouble Sleeping?: Yes Explanation of Sleeping Difficulties: Often doesn't fall asleep until 3am.  Has trouble staying asleep.  Has nightmares.  CCA Part Two C  Alcohol/Drug Use: Alcohol / Drug Use History of alcohol / drug use?: Yes Substance #1 Name of Substance 1: Alcohol 1 - Age of First Use: 17 1 - Amount (size/oz): "I was drinking about a 6 pack every day.  All of a sudden I just cut back."  Reports now typically has 2-3 beers.     1 - Frequency: About twice a week, it was daily "months ago"   1 - Last Use / Amount: Two weeks ago, 2 beers                    CCA Part Three  ASAM's:  Six Dimensions of Multidimensional  Assessment  Dimension 1:  Acute Intoxication and/or Withdrawal Potential:     Dimension 2:  Biomedical Conditions and Complications:     Dimension 3:  Emotional, Behavioral, or Cognitive Conditions and Complications:     Dimension 4:  Readiness to Change:     Dimension 5:  Relapse, Continued use, or Continued Problem Potential:     Dimension 6:  Recovery/Living Environment:      Substance use Disorder (SUD)    Social Function:  Social Functioning Social Maturity: Isolates Social Judgement: Victimized  Stress:  Stress Stressors:  (Experiences  some chronic pain) Coping Ability: Overwhelmed, Exhausted Patient Takes Medications The Way The Doctor Instructed?: Yes  Risk Assessment- Self-Harm Potential: Risk Assessment For Self-Harm Potential Thoughts of Self-Harm: Vague current thoughts Method: No plan Additional Comments for Self-Harm Potential: Reports "I know I could never kill myself.  It is against my religious beliefs."  Risk Assessment -Dangerous to Others Potential: Risk Assessment For Dangerous to Others Potential Method: No Plan Availability of Means: No access or NA Notification Required: No need or identified person Additional Comments for Danger to Others Potential: Reports "If I get upset in a confrontation, the first thing I want to do is get rid of them.  There have been a lot of close calls."  Denies history of harm to others.    DSM5 Diagnoses: Patient Active Problem List   Diagnosis Date Noted  . PTSD (post-traumatic stress disorder) 07/09/2015  . Dysthymia 07/09/2015  . Generalized anxiety disorder 07/09/2015  . Male hypogonadism 07/08/2015  . Depression 07/08/2015  . Morbid obesity ([place]) 08/06/2014  . Left knee pain 05/28/2014  . Acute gouty arthritis 12/04/2013  . Helicobacter pylori (H. pylori) infection 02/10/2012  . Total knee replacement status, right 11/17/2011  . Meralgia paresthetica 09/23/2011  . Diabetes ([place]) 07/08/2011  . Hypogonadism male  07/08/2011  . Routine general medical examination at a health care facility 07/08/2011  . DJD (degenerative joint disease) of knee 04/19/2011  . Hyperlipidemia 03/15/2007  . OBESITY NOS 03/15/2007  . Essential hypertension 03/15/2007      Recommendations for Services/Supports/Treatments: Recommendations for Services/Supports/Treatments Recommendations For Services/Supports/Treatments: Individual Therapy, Medication Management  Treatment Plan Summary: Will develop treatment plan at first therapy session.   Garnette Scheuermann

## 2015-07-25 ENCOUNTER — Telehealth: Payer: Self-pay | Admitting: Internal Medicine

## 2015-07-25 ENCOUNTER — Ambulatory Visit (INDEPENDENT_AMBULATORY_CARE_PROVIDER_SITE_OTHER): Payer: BLUE CROSS/BLUE SHIELD | Admitting: Licensed Clinical Social Worker

## 2015-07-25 DIAGNOSIS — F4310 Post-traumatic stress disorder, unspecified: Secondary | ICD-10-CM

## 2015-07-25 DIAGNOSIS — F341 Dysthymic disorder: Secondary | ICD-10-CM

## 2015-07-25 DIAGNOSIS — F411 Generalized anxiety disorder: Secondary | ICD-10-CM

## 2015-07-25 MED ORDER — ESCITALOPRAM OXALATE 10 MG PO TABS: 10.0000 mg | ORAL_TABLET | Freq: Every day | ORAL | Status: DC

## 2015-07-25 NOTE — Telephone Encounter (Signed)
Pt requesting medication for depression. He states you discussed this at his last appointment Pharmacy is [facility] [address] in [place]

## 2015-07-25 NOTE — Telephone Encounter (Signed)
Ok for [employer] - done erx

## 2015-07-27 NOTE — Progress Notes (Signed)
   THERAPIST PROGRESS NOTE  Session Time: 4:10pm-5:05pm  Participation Level: Active  Behavioral Response: CasualAlertEuthymic  Type of Therapy: Individual Therapy  Treatment Goals addressed: Developed treatment goals today  Interventions: Treatment planning and psycho-education about PTSD  Suicidal/Homicidal: Denied both  Therapist Interventions: Collaborated with patient to develop his treatment plan.  Identified long term and short terms goals.   Educated patient about the symptoms of PTSD.  Encouraged him to elaborate on how the symptoms have affected him.  Emphasized that it is normal to have a variety of difficulties after enduring a traumatic experience.  Summary:  Expressed satisfaction with the goals chosen.   Indicated he appreciated therapist taking time to explain PTSD symptoms.  ASked if the disorder could be "reversed."  Therapist acknowledged that the ultimate goal is not necessarily to completely get rid of symptoms but to be able to recognize them as responses to being triggered and to feel confident about one's ability to cope with the symptoms when they do emerge.   Reported having two significant anger outbursts since last seen.  One was at work.  The other an argument with his girlfriend.  In both situations he managed to avoid becoming overly aggressive because he removed himself from the source of frustration.   Plan: Scheduled to return next week.  Will explain how fight or flight is related to the development of panic symptoms  Diagnosis: PTSD                        Persistent Depressive Disorder                        Generalized Anxiety Disorder    Garnette Scheuermann, LCSW 07/25/2015

## 2015-07-28 LAB — HM DIABETES EYE EXAM

## 2015-08-01 ENCOUNTER — Ambulatory Visit (INDEPENDENT_AMBULATORY_CARE_PROVIDER_SITE_OTHER): Payer: BLUE CROSS/BLUE SHIELD | Admitting: Licensed Clinical Social Worker

## 2015-08-01 DIAGNOSIS — F341 Dysthymic disorder: Secondary | ICD-10-CM | POA: Diagnosis not present

## 2015-08-01 DIAGNOSIS — F411 Generalized anxiety disorder: Secondary | ICD-10-CM

## 2015-08-01 DIAGNOSIS — F4310 Post-traumatic stress disorder, unspecified: Secondary | ICD-10-CM | POA: Diagnosis not present

## 2015-08-01 NOTE — Progress Notes (Signed)
   THERAPIST PROGRESS NOTE  Session Time: 3:05-3:55pm  Participation Level: Active  Behavioral Response: CasualAlertEuthymic  Type of Therapy: Individual Therapy  Treatment Goals addressed: Verbalize awareness of how PTSD develops and its impact on self and others  Interventions: Psycho-education about anxiety  Suicidal/Homicidal: Admitted to some SI without plan or intent, denied HI  Therapist Interventions: Educated patient about fight or flight and how it is activated whenever you have a thought that there is some kind of threat.  Explained that it doesn't matter whether the threat is real or not.  Reviewed panic symptoms.  Prompted patient to describe what he tends to experience in his body when anxiety comes on.  Emphasized that the physiological response symptoms are not dangerous.        Summary:  Was familiar with the concept of fight or flight.  Was able to describe what tends to happen in his body when the response kicks in.  Noted that trembling is one of the first signs and then he may notice that his breathing has gotten heavy.  Did not realize that the symptoms are not dangerous.   Reported that he started taking the generic form of Lexapro.  Noted experiencing sweating as a side effect.   Did not report any significant events in the past week.  Mood has been "about the same."      Plan: Will introduce mindfulness at next session.  Diagnosis: PTSD                        Persistent Depressive Disorder                        Generalized Anxiety Disorder    [name] 08/01/2015

## 2015-08-04 NOTE — Telephone Encounter (Signed)
  Mailing patient letter to prompt him to call me to set up hospital colonoscopy.

## 2015-08-07 ENCOUNTER — Telehealth (HOSPITAL_COMMUNITY): Payer: Self-pay

## 2015-08-07 ENCOUNTER — Encounter (HOSPITAL_COMMUNITY): Payer: Self-pay | Admitting: Medical

## 2015-08-07 ENCOUNTER — Ambulatory Visit (INDEPENDENT_AMBULATORY_CARE_PROVIDER_SITE_OTHER): Payer: BLUE CROSS/BLUE SHIELD | Admitting: Medical

## 2015-08-07 VITALS — BP 118/72 | HR 69 | Ht 74.0 in | Wt 358.8 lb

## 2015-08-07 DIAGNOSIS — F341 Dysthymic disorder: Secondary | ICD-10-CM | POA: Diagnosis not present

## 2015-08-07 DIAGNOSIS — F4310 Post-traumatic stress disorder, unspecified: Secondary | ICD-10-CM | POA: Diagnosis not present

## 2015-08-07 DIAGNOSIS — F515 Nightmare disorder: Secondary | ICD-10-CM | POA: Diagnosis not present

## 2015-08-07 DIAGNOSIS — F32A Depression, unspecified: Secondary | ICD-10-CM

## 2015-08-07 DIAGNOSIS — F329 Major depressive disorder, single episode, unspecified: Secondary | ICD-10-CM

## 2015-08-07 DIAGNOSIS — F4312 Post-traumatic stress disorder, chronic: Secondary | ICD-10-CM | POA: Insufficient documentation

## 2015-08-07 MED ORDER — PRAZOSIN HCL 1 MG PO CAPS
1.0000 mg | ORAL_CAPSULE | Freq: Every day | ORAL | Status: DC
Start: 2015-08-07 — End: 2015-09-25

## 2015-08-07 NOTE — Telephone Encounter (Signed)
Telephone call with patient for Darlyne Russian, PA-C to inform [name] also added Minipress 1 mg capsule, one at night to help with reported problems with nightmares.   Reviewed medication with patient and he agreed to call back if any problems once he starts the new medication.

## 2015-08-07 NOTE — Progress Notes (Signed)
Psychiatric Initial Adult Assessment   Patient Identification: [name] MRN:  [id number] Date of Evaluation:  08/07/2015 Referral Source:   Referring Provider       Biagio Borg, MD  Garnette Scheuermann, LCSW Subjective:"I want to get the fear out of me.  I want to function in society." Chief Complaint:   Chief Complaint    Establish Care; Stress; Trauma; Depression; Obesity; nightmares     Visit Diagnosis:    ICD-9-CM ICD-10-CM   1. PTSD (post-traumatic stress disorder) 309.81 F43.10   2. Depression 311 F32.9   3. Dysthymia 300.4 F34.1   4. Nightmares associated with chronic post-traumatic stress disorder 307.47 F51.5    309.81 F43.10   5. Morbid obesity, unspecified obesity type ([place]) 278.01 E66.01    Diagnosis:   Patient Active Problem List   Diagnosis Date Noted  . Nightmares associated with chronic post-traumatic stress disorder [F51.5, F43.10] 08/07/2015  . PTSD (post-traumatic stress disorder) [F43.10] 07/09/2015  . Dysthymia [F34.1] 07/09/2015  . Generalized anxiety disorder [F41.1] 07/09/2015  . Male hypogonadism [E29.1] 07/08/2015  . Depression [F32.9] 07/08/2015  . Morbid obesity ([place]) [E66.01] 08/06/2014  . Left knee pain [M25.562] 05/28/2014  . Acute gouty arthritis [M10.9] 12/04/2013  . Helicobacter pylori (H. pylori) infection [B96.81] 02/10/2012  . Total knee replacement status, right [Z96.651] 11/17/2011  . Meralgia paresthetica [G57.10] 09/23/2011  . Diabetes ([place]) [E11.9] 07/08/2011  . Hypogonadism male [E29.1] 07/08/2011  . Routine general medical examination at a health care facility [Z00.00] 07/08/2011  . DJD (degenerative joint disease) of knee [M17.9] 04/19/2011  . Hyperlipidemia [E78.5] 03/15/2007  . OBESITY NOS [E66.9] 03/15/2007  . Essential hypertension [I10] 03/15/2007   Associated Signs/Symptoms Childhood History:   Childhood History By whom was/is the patient raised?: Both parents Additional childhood history information: Grew up in  [place]. Description of patient's relationship with caregiver when they were a child: Good relationship with mom.  Relationship with dad was "fair"    Patient's description of current relationship with people who raised him/her: Both are deceased.  Mom in 11-01-94.  Dad in February 2016.    How were you disciplined when you got in trouble as a child/adolescent?: My mother never whipped me.  Dad was the disciplinarian.  "I was a good child growing up." Does patient have siblings?: Yes Number of Siblings: 1 Description of patient's current relationship with siblings: Sister, Camela 2051/11/01)- distant relationship  Lives in [place].  Reports they were close growing up. Did patient suffer any verbal/emotional/physical/sexual abuse as a child?: No Did patient suffer from severe childhood neglect?: No Has patient ever been sexually abused/assaulted/raped as an adolescent or adult?: No Was the patient ever a victim of a crime or a disaster?: No Witnessed domestic violence?: No Has patient been effected by domestic violence as an adult?: No  Depression Symptoms:  depressed mood, anhedonia, insomnia, psychomotor retardation, feelings of worthlessness/guilt, difficulty concentrating, hopelessness, recurrent thoughts of death, anxiety, loss of energy/fatigue, disturbed sleep, weight gain, (Hypo) Manic Symptoms:  Distractibility, Irritable Mood, Labiality of Mood, Anxiety Symptoms:  Excessive Worry, Psychotic Symptoms:  None PTSD Symptoms: Had a traumatic exposure:  per HPI Had a traumatic exposure in the last month:  NA Re-experiencing:  Flashbacks Intrusive Thoughts Nightmares Hypervigilance:  Yes Hyperarousal:  Difficulty Concentrating Emotional Numbness/Detachment Increased Startle Response Irritability/Anger Sleep Avoidance:  Decreased Interest/Participation  Past Medical History:  Past Medical History  Diagnosis Date  . Hyperlipidemia   . HTN (hypertension)   . Hyperglycemia    . Gallstones   .  Arthritis   . DJD (degenerative joint disease)   . Obesity   . Diabetes mellitus ([place])   . Gout   . GERD (gastroesophageal reflux disease)   . Male hypogonadism 07/08/2015  . Depression 07/08/2015  . Diabetes mellitus, type II [facility])     Past Surgical History  Procedure Laterality Date  . Repair knee ligament      Left  . Back cyst excision      x2  . Tennis elbow surgery, nerve entrapment      x2 right  . Shoulder surgery, reconstruction of joint      Left x 3, Last 02/2007  . Joint replacement  01/26/2011    right knee  . Right foot      BONES STRAIGHTENED OUT   2006  . Cholecystectomy  08/11/2011    Procedure: LAPAROSCOPIC CHOLECYSTECTOMY WITH INTRAOPERATIVE CHOLANGIOGRAM;  Surgeon: Judieth Keens, DO;  Location: [facility];  Service: General;  Laterality: N/A;  Laparoscopic cholecystectomy with cholangiogram.  . Knee arthroscopy      x 2 left  . Knee arthroscopy      x 2 right   Family History:  Family History  Problem Relation Age of Onset  . Sarcoidosis Mother   . Colon cancer Brother 3    half-brother  . Cancer Neg Hx   . Heart disease Neg Hx   . Kidney disease Neg Hx   . Ovarian cancer Maternal Grandmother    Social History:   Social History   Social History  . Marital Status: Married    Spouse Name: N/A  . Number of Children: 1  . Years of Education: N/A   Occupational History  . [employer]   .     Social History Main Topics  . Smoking status: Never Smoker   . Smokeless tobacco: Never Used  . Alcohol Use: No     Comment: socially=occasional   . Drug Use: No  . Sexual Activity: Yes    Birth Control/ Protection: Condom   Other Topics Concern  . None   Social History Narrative   ** Merged History Encounter **       Works - Banker, out on medical leave x 1 year      Married 8 years - divorced 2004   Daughter 1988 - [employer] doing well. Has her own place   Monogamous relationships   Additional Social History:  Family and  Psychosocial History: Family history Marital status: Divorced Divorced, when?: 1994 Additional relationship information: Married for 6 years to [place].  "When I came home from the [place] I was a totally different person.  She was afraid." Does patient have children?: Yes How many children?: 1 How is patient's relationship with their children?: Daughter, Paris (28) lives in [place].  Says the relationship "could be better."  She says he is hard to talk to.  "I try to talk to her every day Employment/Work Situation: Employment / Work Situation Employment situation: Employed Where is patient currently employed?: [employer]- Financial controller work   How long has patient been employed?: 22 years Patient's job has been impacted by current illness: Yes Describe how patient's job has been impacted: Sometimes I get aggrevated because it gets demanding.    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?: No Are There Guns or Other Weapons in [place]?: Yes Types of Guns/Weapons: a 45 handgun Are These Weapons Safely Secured?: Yes (Locked in a Paramedic) Education: Education Last Grade Completed: 12 Did Teacher, adult education From [employer]?: Yes Did You Attend College?: No Did [place]?: No Did You Have Any Special Interests In School?: English and science Did You Have An Individualized Education Program (IIEP): No Did You Have Any Difficulty At School?: No  Religion: Religion/Spirituality Are You A Religious Person?: Yes What is Your Religious Affiliation?: Psychologist, forensic (Member of [employer] in [place])  Leisure/Recreation: Leisure / Recreation Leisure and Hobbies: Works up  to 60 hrs a week.  Generally stays at home otherwise.  Tries to keep busy with household tasks.  Lost interest in sports.  Used to play football and basketball.    Exercise/Diet: Exercise/Diet Do You Exercise?: No (Has chronic knee pain) Have You Gained or Lost A Significant Amount of Weight in the Past Six Months?: No Do You Follow a Special Diet?: No Do You Have Any Trouble Sleeping?: Yes Explanation of Sleeping Difficulties: Often doesn't fall asleep until 3am.  Has trouble staying asleep.  Has nightmares.       Musculoskeletal: Strength & Muscle Tone: within normal limits Gait & Station: normal Patient leans: N/A  Psychiatric Specialty Exam: HPI49 y/o BM Had been getting services at the [name] but hasn't been satisfied with treatment.  "I'm thinking I've got some PTSD going on.  I don't sleep.  My anger is a problem.  I go from 0 to 100 real quick.  My friends have backed away from me. I don't enjoy being around a lot of people. "  Believes his experience in the [place] changed him significantly.At first says "everything" about the [employer] was traumatic but then admits basic training was traumatic to him. He sought help from his PCP Dr Jenny Reichmann who started him on Lexapro 10 mg about a month ago with om nly a partial response.     Patients Currently Reported Symptoms/Problems: "I can't be the man that I need to be for my family.  I can't enjoy playing with my 66 year old grandson."    "One thing I am good at is I try to be a good provider."     ROS Review of Systems Constitutional:Morbid obesity. Negative for increased diaphoresis, other activity, appetite or siginficant weight change other than noted HENT: Negative for worsening hearing loss, ear pain, facial swelling, mouth sores and neck stiffness.   Eyes: Negative for other worsening pain, redness or visual disturbance.  Respiratory: Negative for shortness of breath and wheezing  Cardiovascular: Negative for chest pain and palpitations.   Gastrointestinal: Negative for diarrhea, blood in stool, abdominal distention or other pain Genitourinary: Negative for hematuria, flank pain or change in urine volume.  Musculoskeletal: Negative for myalgias or other joint complaints.  Skin: Negative for color change and wound or drainage.  Neurological: Negative for syncope and numbness. other than noted Hematological: Negative for adenopathy. or other swelling Psychiatric/Behavioral: Negative for hallucinations, SI, self-injury.Denies substance abuse;social alcohol only;C/O service connected PTSD.    Blood pressure 118/72, pulse 69, height 6\' 2"  (1.88 m), weight 358 lb 12.8 oz (162.751 kg).Body mass index is 46.05 kg/(m^2).  General Appearance: Fairly Groomed  Eye Contact:  Good  Speech:  Clear and Coherent  Volume:  Normal  Mood:  Dysphoric  Affect:  Congruent  Thought Process:  Coherent  Orientation:  Full (Time, Place, and Person)  Thought Content:  WDL and Rumination  Suicidal Thoughts:  No  Homicidal Thoughts:  No  Memory:  Negative except traumatic  Judgement:  Fair  Insight:  Lacking  Psychomotor Activity:  Decreased  Concentration:  Fair  Recall:  [place] of Knowledge:Fair  Language: Fair  Akathisia:  NA  Handed:  Right  AIMS (if indicated):  NA  Assets:  Desire for Improvement Financial Resources/Insurance Housing Resilience Transportation Vocational/Educational  ADL's:  Intact  Cognition: WNL  Sleep: Nightmares   Is the patient at risk to self?  No. Has the patient been a risk to self in the past 6 months?  No. Has the patient been a risk to self within the distant past?  No. Is the patient a risk to others?  No. Has the patient been a risk to others in the past 6 months?  No. Has the patient been a risk to others within the distant past?  No.  Allergies:  No Known Allergies Current Medications: Current Outpatient Prescriptions  Medication Sig Dispense Refill  . escitalopram (LEXAPRO) 10 MG tablet  Take 1 tablet (10 mg total) by mouth daily. 90 tablet 3  . HYDROcodone-acetaminophen (NORCO/VICODIN) 5-325 MG tablet Take 1 tablet by mouth every 6 (six) hours as needed for moderate pain. 40 tablet 0  . indomethacin (INDOCIN) 50 MG capsule TAKE ONE CAPSULE EVERY 6 HOURS AS NEEDED GOUT ONLY 60 capsule 2  . meloxicam (MOBIC) 15 MG tablet Take 1 tablet (15 mg total) by mouth daily. 30 tablet 5  . metFORMIN (GLUCOPHAGE) 500 MG tablet TAKE 1 TABLET (500 MG TOTAL) BY MOUTH 2 (TWO) TIMES DAILY WITH A MEAL. 180 tablet 3  . olmesartan (BENICAR) 20 MG tablet TAKE 1 TABLET (20 MG TOTAL) BY MOUTH DAILY. 90 tablet 3  . testosterone (ANDROGEL) 50 MG/5GM (1%) GEL Place 5 g onto the skin daily. 450 g 1  . vardenafil (LEVITRA) 20 MG tablet Take 1 tablet (20 mg total) by mouth daily as needed for erectile dysfunction. 6 tablet 5  . prazosin (MINIPRESS) 1 MG capsule Take 1 capsule (1 mg total) by mouth at bedtime. 30 capsule 1   No current facility-administered medications for this visit.    Previous Psychotropic Medications: No   Substance Abuse History in the last 12 months:   Alcohol/Drug Use: Alcohol / Drug Use History of alcohol / drug use?: Yes Substance #1 Name of Substance 1: Alcohol 1 - Age of First Use: 17 1 - Amount (size/oz): "I was drinking about a 6 pack every day.  All of a sudden I just cut back."  Reports now typically has 2-3 beers.      1 - Frequency: About twice a week, it was daily "months ago"    1 - Last Use / Amount: Two weeks ago, 2 beers     Consequences of Substance Abuse: Medical Consequences:  wgt gain Legal Consequences:  none Family Consequences:  Divorced  Medical Decision Making:  Established Problem, Worsening (2), Review of Last Therapy Session (1), Review of Medication Regimen & Side Effects (2) and Review of New Medication or Change in Dosage (2)  Treatment Plan Summary: Continue counseling;Increase Lexapro to 20 mg daily;Rx [facility] for nightmares;FU 3  weeks;Continue to pursue VA  benefits    Darlyne Russian 1/26/20174:53 PM

## 2015-08-13 ENCOUNTER — Encounter: Payer: Self-pay | Admitting: Internal Medicine

## 2015-08-18 ENCOUNTER — Ambulatory Visit: Payer: BLUE CROSS/BLUE SHIELD | Admitting: Sports Medicine

## 2015-08-26 ENCOUNTER — Telehealth: Payer: Self-pay | Admitting: Internal Medicine

## 2015-08-26 ENCOUNTER — Ambulatory Visit: Payer: Self-pay

## 2015-08-26 ENCOUNTER — Encounter: Payer: BLUE CROSS/BLUE SHIELD | Admitting: Podiatry

## 2015-08-26 DIAGNOSIS — M79671 Pain in right foot: Secondary | ICD-10-CM

## 2015-08-26 NOTE — Progress Notes (Signed)
This encounter was created in error - please disregard.

## 2015-08-26 NOTE — Telephone Encounter (Signed)
Spoke to patient and will set up hospital colon procedure  and call patient back with date/time.

## 2015-08-26 NOTE — Telephone Encounter (Signed)
Spoke to patient and will call  ENDO unit to set up colon at [facility] and inform Davone of date/time.

## 2015-08-27 ENCOUNTER — Other Ambulatory Visit: Payer: Self-pay

## 2015-08-27 DIAGNOSIS — Z1211 Encounter for screening for malignant neoplasm of colon: Secondary | ICD-10-CM

## 2015-08-27 NOTE — Telephone Encounter (Signed)
Patient is set up for colonoscopy at [facility] on 09/15/15 at 10:00AM, arrive at 8:30AM.  Will mail him new instructions and per Barb Merino, RN, CGRN ok to do that, he had a previous pre-visit and his procedure was cancelled.  He already has his supplies he stated yesterday.  Spoke with patient and he will call me with any questions once he gets the instructions in the mail.

## 2015-08-27 NOTE — Telephone Encounter (Signed)
Patient informed of date/time of colonoscopy at [facility] and mailed new instructions.

## 2015-08-28 ENCOUNTER — Ambulatory Visit (HOSPITAL_COMMUNITY): Payer: BLUE CROSS/BLUE SHIELD | Admitting: Medical

## 2015-09-01 ENCOUNTER — Ambulatory Visit: Payer: BLUE CROSS/BLUE SHIELD | Admitting: Podiatry

## 2015-09-05 ENCOUNTER — Encounter (HOSPITAL_COMMUNITY): Payer: Self-pay | Admitting: *Deleted

## 2015-09-15 ENCOUNTER — Ambulatory Visit (HOSPITAL_COMMUNITY): Payer: BLUE CROSS/BLUE SHIELD | Admitting: Anesthesiology

## 2015-09-15 ENCOUNTER — Ambulatory Visit (HOSPITAL_COMMUNITY)
Admission: RE | Admit: 2015-09-15 | Discharge: 2015-09-15 | Disposition: A | Discharge status: Home / Self Care | Payer: BLUE CROSS/BLUE SHIELD | Source: Ambulatory Visit | Attending: Internal Medicine | Admitting: Internal Medicine

## 2015-09-15 ENCOUNTER — Other Ambulatory Visit: Payer: Self-pay | Admitting: *Deleted

## 2015-09-15 ENCOUNTER — Encounter (HOSPITAL_COMMUNITY): Payer: Self-pay | Admitting: Internal Medicine

## 2015-09-15 ENCOUNTER — Encounter (HOSPITAL_COMMUNITY)
Admission: RE | Disposition: A | Discharge status: Home / Self Care | Payer: Self-pay | Source: Ambulatory Visit | Attending: Internal Medicine

## 2015-09-15 DIAGNOSIS — M109 Gout, unspecified: Secondary | ICD-10-CM | POA: Diagnosis not present

## 2015-09-15 DIAGNOSIS — Z79899 Other long term (current) drug therapy: Secondary | ICD-10-CM | POA: Insufficient documentation

## 2015-09-15 DIAGNOSIS — Z7984 Long term (current) use of oral hypoglycemic drugs: Secondary | ICD-10-CM | POA: Diagnosis not present

## 2015-09-15 DIAGNOSIS — F329 Major depressive disorder, single episode, unspecified: Secondary | ICD-10-CM | POA: Insufficient documentation

## 2015-09-15 DIAGNOSIS — Z791 Long term (current) use of non-steroidal anti-inflammatories (NSAID): Secondary | ICD-10-CM | POA: Insufficient documentation

## 2015-09-15 DIAGNOSIS — E669 Obesity, unspecified: Secondary | ICD-10-CM | POA: Diagnosis not present

## 2015-09-15 DIAGNOSIS — I10 Essential (primary) hypertension: Secondary | ICD-10-CM | POA: Insufficient documentation

## 2015-09-15 DIAGNOSIS — M1990 Unspecified osteoarthritis, unspecified site: Secondary | ICD-10-CM | POA: Diagnosis not present

## 2015-09-15 DIAGNOSIS — Z1211 Encounter for screening for malignant neoplasm of colon: Secondary | ICD-10-CM | POA: Diagnosis not present

## 2015-09-15 DIAGNOSIS — E119 Type 2 diabetes mellitus without complications: Secondary | ICD-10-CM | POA: Diagnosis not present

## 2015-09-15 DIAGNOSIS — Z96651 Presence of right artificial knee joint: Secondary | ICD-10-CM | POA: Diagnosis not present

## 2015-09-15 DIAGNOSIS — K219 Gastro-esophageal reflux disease without esophagitis: Secondary | ICD-10-CM | POA: Diagnosis not present

## 2015-09-15 DIAGNOSIS — E785 Hyperlipidemia, unspecified: Secondary | ICD-10-CM | POA: Insufficient documentation

## 2015-09-15 DIAGNOSIS — Z9049 Acquired absence of other specified parts of digestive tract: Secondary | ICD-10-CM | POA: Diagnosis not present

## 2015-09-15 DIAGNOSIS — Z800 Family history of malignant neoplasm of digestive organs: Secondary | ICD-10-CM | POA: Diagnosis not present

## 2015-09-15 HISTORY — DX: Nausea with vomiting, unspecified: R11.2

## 2015-09-15 HISTORY — PX: COLONOSCOPY WITH PROPOFOL: SHX5780

## 2015-09-15 HISTORY — DX: Other specified postprocedural states: R11.2

## 2015-09-15 HISTORY — DX: Other specified postprocedural states: Z98.890

## 2015-09-15 LAB — GLUCOSE, CAPILLARY: Glucose-Capillary: 102 mg/dL — ABNORMAL HIGH (ref 65–99)

## 2015-09-15 SURGERY — COLONOSCOPY WITH PROPOFOL
Anesthesia: Monitor Anesthesia Care

## 2015-09-15 MED ORDER — LACTATED RINGERS IV SOLN
INTRAVENOUS | Status: DC
  Administered 2015-09-15: 1000 mL via INTRAVENOUS

## 2015-09-15 MED ORDER — PROPOFOL 500 MG/50ML IV EMUL
INTRAVENOUS | Status: DC | PRN
  Administered 2015-09-15: 100 ug/kg/min via INTRAVENOUS

## 2015-09-15 MED ORDER — SODIUM CHLORIDE 0.9 % IV SOLN: INTRAVENOUS | Status: DC

## 2015-09-15 MED ORDER — LIDOCAINE HCL (CARDIAC) 20 MG/ML IV SOLN
INTRAVENOUS | Status: AC
  Filled 2015-09-15: qty 5

## 2015-09-15 MED ORDER — PROPOFOL 500 MG/50ML IV EMUL
INTRAVENOUS | Status: DC | PRN
Start: 2015-09-15 — End: 2015-09-15
  Administered 2015-09-15: 50 mg via INTRAVENOUS
  Administered 2015-09-15 (×2): 30 mg via INTRAVENOUS

## 2015-09-15 MED ORDER — PROPOFOL 10 MG/ML IV BOLUS
INTRAVENOUS | Status: AC
  Filled 2015-09-15: qty 20

## 2015-09-15 MED ORDER — PROPOFOL 10 MG/ML IV BOLUS
INTRAVENOUS | Status: AC
  Filled 2015-09-15: qty 40

## 2015-09-15 SURGICAL SUPPLY — 21 items

## 2015-09-15 NOTE — Discharge Instructions (Signed)
° °  Your colonoscopy was normal. Your next routine colonoscopy should be in 5 years - 2022.  I appreciate the opportunity to care for you. Gatha Mayer, MD, FACG  YOU HAD AN ENDOSCOPIC PROCEDURE TODAY: Refer to the procedure report and other information in the discharge instructions given to you for any specific questions about what was found during the examination. If this information does not answer your questions, please call Dr. Celesta Aver office at [phone] to clarify.   YOU SHOULD EXPECT: Some feelings of bloating in the abdomen. Passage of more gas than usual. Walking can help get rid of the air that was put into your GI tract during the procedure and reduce the bloating. If you had a lower endoscopy (such as a colonoscopy or flexible sigmoidoscopy) you may notice spotting of blood in your stool or on the toilet paper. Some abdominal soreness may be present for a day or two, also.  DIET: Your first meal following the procedure should be a light meal and then it is ok to progress to your normal diet. A half-sandwich or bowl of soup is an example of a good first meal. Heavy or fried foods are harder to digest and may make you feel nauseous or bloated. Drink plenty of fluids but you should avoid alcoholic beverages for 24 hours.   ACTIVITY: Your care partner should take you home directly after the procedure. You should plan to take it easy, moving slowly for the rest of the day. You can resume normal activity the day after the procedure however YOU SHOULD NOT DRIVE, use power tools, machinery or perform tasks that involve climbing or major physical exertion for 24 hours (because of the sedation medicines used during the test).   SYMPTOMS TO REPORT IMMEDIATELY: A gastroenterologist can be reached at any hour. Please call [phone]  for any of the following symptoms:  Following lower endoscopy (colonoscopy, flexible sigmoidoscopy) Excessive amounts of blood in the stool  Significant  tenderness, worsening of abdominal pains  Swelling of the abdomen that is new, acute  Fever of 100 or higher  Following upper endoscopy (EGD, EUS, ERCP, esophageal dilation) Vomiting of blood or coffee ground material  New, significant abdominal pain  New, significant chest pain or pain under the shoulder blades  Painful or persistently difficult swallowing  New shortness of breath  Black, tarry-looking or red, bloody stools  FOLLOW UP:  If any biopsies were taken you will be contacted by phone or by letter within the next 1-3 weeks. Call [phone]  if you have not heard about the biopsies in 3 weeks.  Please also call with any specific questions about appointments or follow up tests.

## 2015-09-15 NOTE — Anesthesia Preprocedure Evaluation (Addendum)
Anesthesia Evaluation  Patient identified by MRN, date of birth, ID band Patient awake    Reviewed: Allergy & Precautions, NPO status , Patient's Chart, lab work & pertinent test results  Airway Mallampati: II  TM Distance: >3 FB Neck ROM: Full    Dental   Pulmonary neg pulmonary ROS,    breath sounds clear to auscultation       Cardiovascular hypertension,  Rhythm:Regular Rate:Normal     Neuro/Psych Depression negative neurological ROS     GI/Hepatic Neg liver ROS, GERD  ,  Endo/Other  diabetes, Type 2, Oral Hypoglycemic AgentsMorbid obesity  Renal/GU negative Renal ROS     Musculoskeletal  (+) Arthritis ,   Abdominal   Peds  Hematology negative hematology ROS (+)   Anesthesia Other Findings   Reproductive/Obstetrics                            Anesthesia Physical Anesthesia Plan  ASA: III  Anesthesia Plan: MAC   Post-op Pain Management:    Induction: Intravenous  Airway Management Planned: Natural Airway and Simple Face Mask  Additional Equipment:   Intra-op Plan:   Post-operative Plan:   Informed Consent: I have reviewed the patients History and Physical, chart, labs and discussed the procedure including the risks, benefits and alternatives for the proposed anesthesia with the patient or authorized representative who has indicated his/her understanding and acceptance.     Plan Discussed with: CRNA  Anesthesia Plan Comments:         Anesthesia Quick Evaluation

## 2015-09-15 NOTE — Transfer of Care (Signed)
Immediate Anesthesia Transfer of Care Note  Patient: [name]  Procedure(s) Performed: Procedure(s): COLONOSCOPY WITH PROPOFOL (N/A)  Patient Location: PACU  Anesthesia Type:MAC  Level of Consciousness: awake, alert  and oriented  Airway & Oxygen Therapy: Patient Spontanous Breathing and Patient connected to face mask oxygen  Post-op Assessment: Report given to RN and Post -op Vital signs reviewed and stable  Post vital signs: Reviewed and stable  Last Vitals:  Filed Vitals:   09/15/15 0940  BP: 132/97  Pulse: 88  Resp: 19    Complications: No apparent anesthesia complications

## 2015-09-15 NOTE — Anesthesia Postprocedure Evaluation (Signed)
Anesthesia Post Note  Patient: [name]  Procedure(s) Performed: Procedure(s) (LRB): COLONOSCOPY WITH PROPOFOL (N/A)  Patient location during evaluation: PACU Anesthesia Type: MAC Level of consciousness: awake and alert Pain management: pain level controlled Vital Signs Assessment: post-procedure vital signs reviewed and stable Respiratory status: spontaneous breathing, nonlabored ventilation, respiratory function stable and patient connected to nasal cannula oxygen Cardiovascular status: stable and blood pressure returned to baseline Anesthetic complications: no    Last Vitals:  Filed Vitals:   09/15/15 1039 09/15/15 1100  BP: 88/50 89/50  Pulse: 77 69  Temp: 36.4 C   Resp: 19 18    Last Pain: There were no vitals filed for this visit.               [name]

## 2015-09-15 NOTE — H&P (Signed)
[facility] History and Physical   Primary Care Physician:  Cathlean Cower, MD   Reason for Procedure:   Colon cancer screening w/ family hx - half-brother colon cancer at 39  Plan:    Colonoscopy - The risks and benefits as well as alternatives of endoscopic procedure(s) have been discussed and reviewed. All questions answered. The patient agrees to proceed.   HPI: [name] is a 50 y.o. male without GI complaints here for routine preventive colonoscopy. He has a half-brother with colon cancer at age 74.   Past Medical History  Diagnosis Date  . Hyperlipidemia   . HTN (hypertension)   . Hyperglycemia   . Gallstones   . Arthritis   . DJD (degenerative joint disease)   . Obesity   . Diabetes mellitus ([place])   . Gout   . GERD (gastroesophageal reflux disease)   . Male hypogonadism 07/08/2015  . Depression 07/08/2015  . Diabetes mellitus, type II ([place])   . PONV (postoperative nausea and vomiting)     Past Surgical History  Procedure Laterality Date  . Repair knee ligament      Left  . Back cyst excision      x2  . Tennis elbow surgery, nerve entrapment      x2 right  . Shoulder surgery, reconstruction of joint      Left x 3, Last 02/2007  . Joint replacement  01/26/2011    right knee  . Right foot      BONES STRAIGHTENED OUT   2006  . Cholecystectomy  08/11/2011    Procedure: LAPAROSCOPIC CHOLECYSTECTOMY WITH INTRAOPERATIVE CHOLANGIOGRAM;  Surgeon: Judieth Keens, DO;  Location: [place];  Service: General;  Laterality: N/A;  Laparoscopic cholecystectomy with cholangiogram.  . Knee arthroscopy      x 2 left  . Knee arthroscopy      x 2 right    Prior to Admission medications   Medication Sig Start Date End Date Taking? Authorizing Provider  cromolyn (OPTICROM) 4 % ophthalmic solution Place 1 drop into both eyes 2 (two) times daily.   Yes Historical Provider, MD  escitalopram (LEXAPRO) 10 MG tablet Take 1 tablet (10 mg total) by mouth daily. 07/25/15  10/23/15 Yes Biagio Borg, MD  HYDROcodone-acetaminophen (NORCO/VICODIN) 5-325 MG tablet Take 1 tablet by mouth every 6 (six) hours as needed for moderate pain. 07/08/15  Yes Biagio Borg, MD  indomethacin (INDOCIN) 50 MG capsule TAKE ONE CAPSULE EVERY 6 HOURS AS NEEDED GOUT ONLY 01/21/15  Yes Biagio Borg, MD  meloxicam (MOBIC) 15 MG tablet Take 1 tablet (15 mg total) by mouth daily. 10/14/14  Yes Biagio Borg, MD  metFORMIN (GLUCOPHAGE) 500 MG tablet TAKE 1 TABLET (500 MG TOTAL) BY MOUTH 2 (TWO) TIMES DAILY WITH A MEAL. 07/08/15  Yes Biagio Borg, MD  olmesartan (BENICAR) 20 MG tablet TAKE 1 TABLET (20 MG TOTAL) BY MOUTH DAILY. 07/08/15  Yes Biagio Borg, MD  omeprazole (PRILOSEC) 40 MG capsule Take 40 mg by mouth daily. 06/17/15  Yes Historical Provider, MD  prazosin (MINIPRESS) 1 MG capsule Take 1 capsule (1 mg total) by mouth at bedtime. 08/07/15  Yes Dara Hoyer, PA-C  testosterone (ANDROGEL) 50 MG/5GM (1%) GEL Place 5 g onto the skin daily. 07/08/15  Yes Biagio Borg, MD  vardenafil (LEVITRA) 20 MG tablet Take 1 tablet (20 mg total) by mouth daily as needed for erectile dysfunction. 09/20/13  Yes Neena Rhymes, MD  Current Facility-Administered Medications  Medication Dose Route Frequency Provider Last Rate Last Dose  . 0.9 %  sodium chloride infusion   Intravenous Continuous Gatha Mayer, MD        Allergies as of 08/27/2015  . (No Known Allergies)    Family History  Problem Relation Age of Onset  . Sarcoidosis Mother   . Colon cancer Brother 36    half-brother  . Cancer Neg Hx   . Heart disease Neg Hx   . Kidney disease Neg Hx   . Ovarian cancer Maternal Grandmother     Social History   Social History  . Marital Status: Married    Spouse Name: N/A  . Number of Children: 1  . Years of Education: N/A   Occupational History  . [employer]   .     Social History Main Topics  . Smoking status: Never Smoker   . Smokeless tobacco: Never Used  . Alcohol Use: No      Comment: socially=occasional   . Drug Use: No  . Sexual Activity: Yes    Birth Control/ Protection: Condom          Social History Narrative   ** Merged History Encounter **       Works - Banker, out on medical leave x 1 year      Married 8 years - divorced 2004   Daughter 1988 - [employer] doing well. Has her own place   Monogamous relationships    Review of Systems: Positive for eyeglasses, joint pains, PTSD, depression. All on Tx. All other review of systems negative except as mentioned in the HPI.  Physical Exam: Vital signs in last 24 hours:     General:   Alert,  Well-developed, well-nourished, pleasant and cooperative in NAD - obese Lungs:  Clear throughout to auscultation.   Heart:  Regular rate and rhythm; no murmurs, clicks, rubs,  or gallops. Abdomen:  Soft, nontender and nondistended. Normal bowel sounds.   Neuro/Psych:  Alert and cooperative. Normal mood and affect. A and O x 3   @Carl  Simonne Maffucci, MD, [facility] [phone] (pager) 09/15/2015 9:42 AM@

## 2015-09-15 NOTE — Op Note (Signed)
[facility] [place], [address]   COLONOSCOPY PROCEDURE REPORT  PATIENT: [name], [name]  MR#: [id number] BIRTHDATE: Mar 17, 1966 , 49  yrs. old GENDER: male ENDOSCOPIST: Gatha Mayer, MD, [facility] PROCEDURE DATE:  09/15/2015 PROCEDURE:   Colonoscopy, screening First Screening Colonoscopy - Avg.  risk and is 50 yrs.  old or older Yes.  Prior Negative Screening - Now for repeat screening. N/A  History of Adenoma - Now for follow-up colonoscopy & has been > or = to 3 yrs.  N/A  Polyps removed today? No Recommend repeat exam, <10 yrs? Yes high risk ASA CLASS:   Class III INDICATIONS:Screening for colonic neoplasia and FH Colon or Rectal Adenocarcinoma. MEDICATIONS: Monitored anesthesia care and Per Anesthesia  DESCRIPTION OF PROCEDURE:   After the risks benefits and alternatives of the procedure were thoroughly explained, informed consent was obtained.  The digital rectal exam revealed no abnormalities of the rectum, revealed no prostatic nodules, and revealed the prostate was not enlarged.   The Pentax Ped Colon [id number]  endoscope was introduced through the anus and advanced to the cecum, which was identified by both the appendix and ileocecal valve. No adverse events experienced.   The quality of the prep was good.  (MiraLax was used)  The instrument was then slowly withdrawn as the colon was fully examined. Estimated blood loss is zero unless otherwise noted in this procedure report.      COLON FINDINGS: A normal appearing cecum, ileocecal valve, and appendiceal orifice were identified.  The ascending, transverse, descending, sigmoid colon, and rectum appeared unremarkable. Retroflexed views revealed no abnormalities. The time to cecum = 2.5 Withdrawal time = 12.5   The scope was withdrawn and the procedure completed. COMPLICATIONS: There were no immediate complications.  ENDOSCOPIC IMPRESSION: Normal colonoscopy  RECOMMENDATIONS: Repeat  Colonoscopy in 5 years given 32 yo half-brother w/ colon cancer  eSigned:  Gatha Mayer, MD, [facility] 09/15/2015 10:46 AM   cc: Dr. Cathlean Cower and The Patient

## 2015-09-24 IMAGING — US US ASPIRATION
1 series · 6 of 6 positions shown · non-contrast
Comparison: Ultrasound 05/14/2011, 08/19/2014

CLINICAL DATA: 48-year-old male with a history of cystic lesion in
the left parotid

EXAM:
ULTRASOUND GUIDED ASPIRATION BIOPSY OF LEFT PAROTID LESION
MEDICATIONS:
Zero mg IV Versed; 0 mcg IV Fentanyl
Total Moderate Sedation Time: 0

[Series 1: us aspiration · 0.07mm/px · 6 acquisitions, 6 frames shown]
[im 1/6]
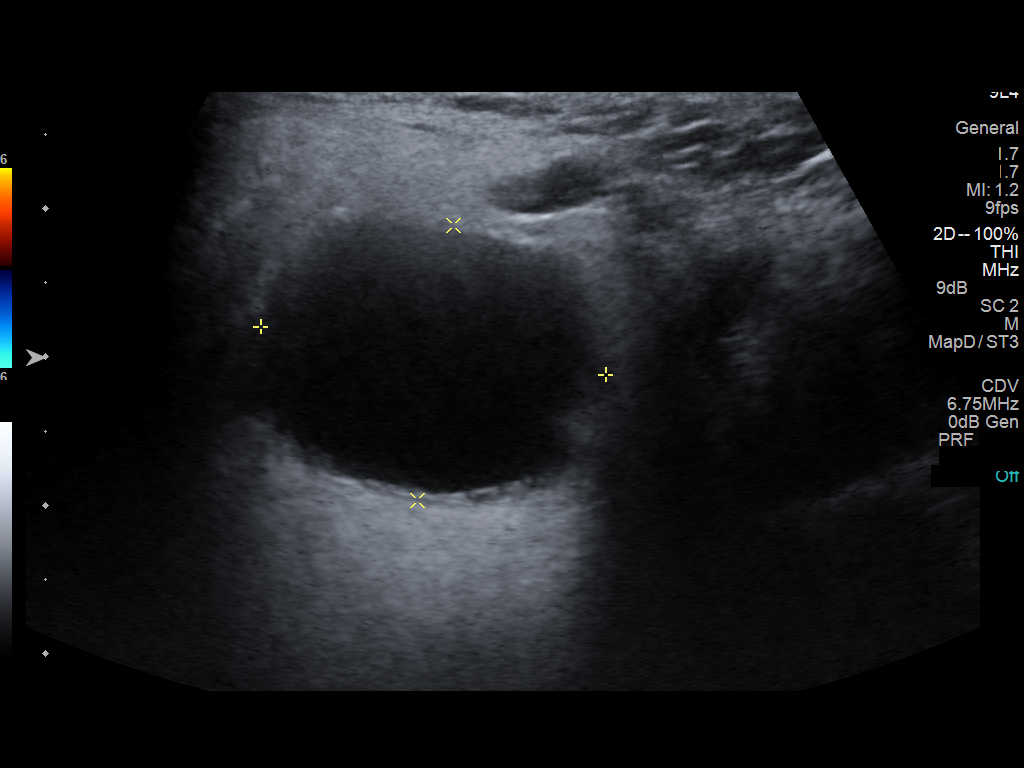
[im 2/6]
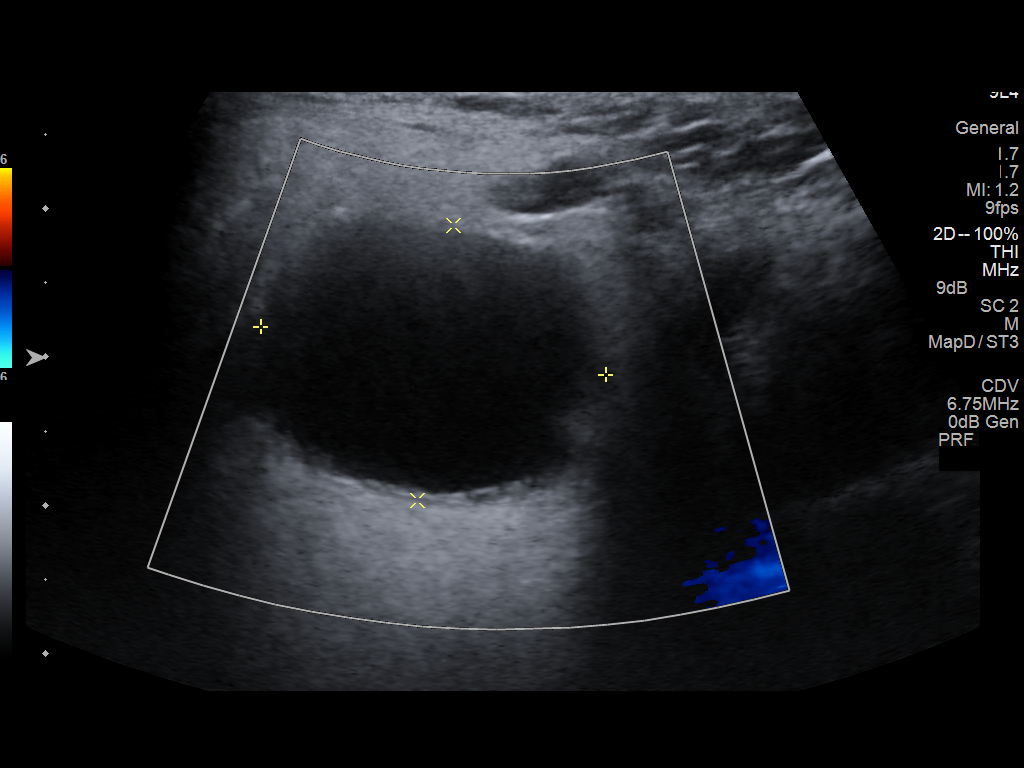
[im 3/6]
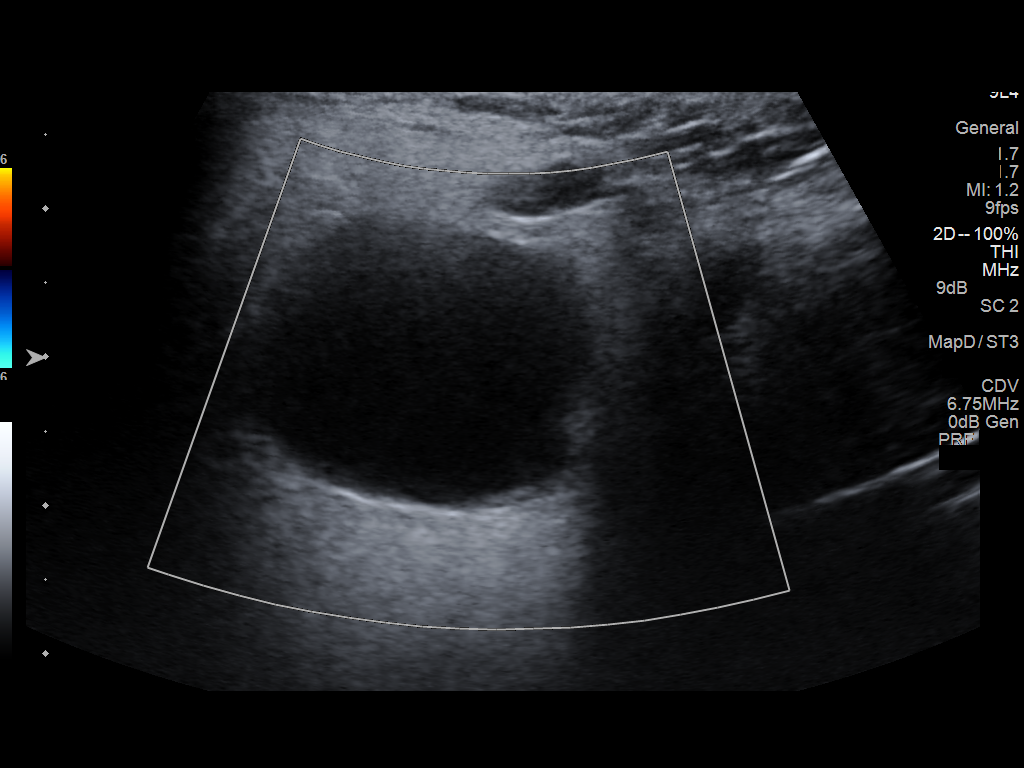
[im 4/6]
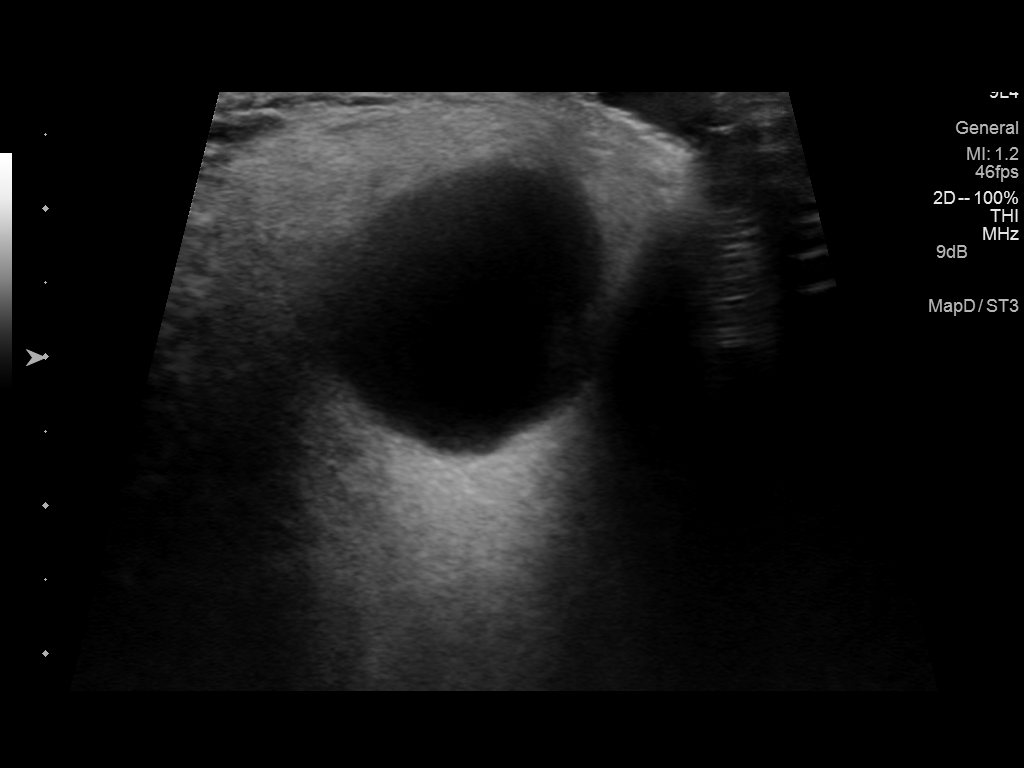
[im 5/6]
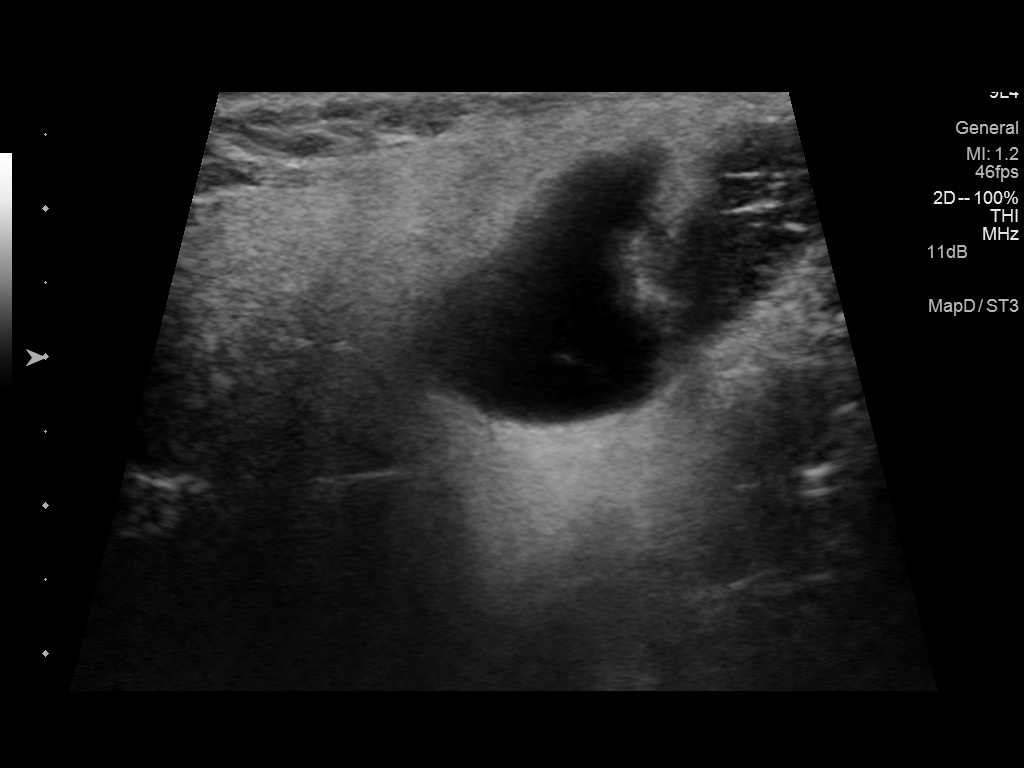
[im 6/6]
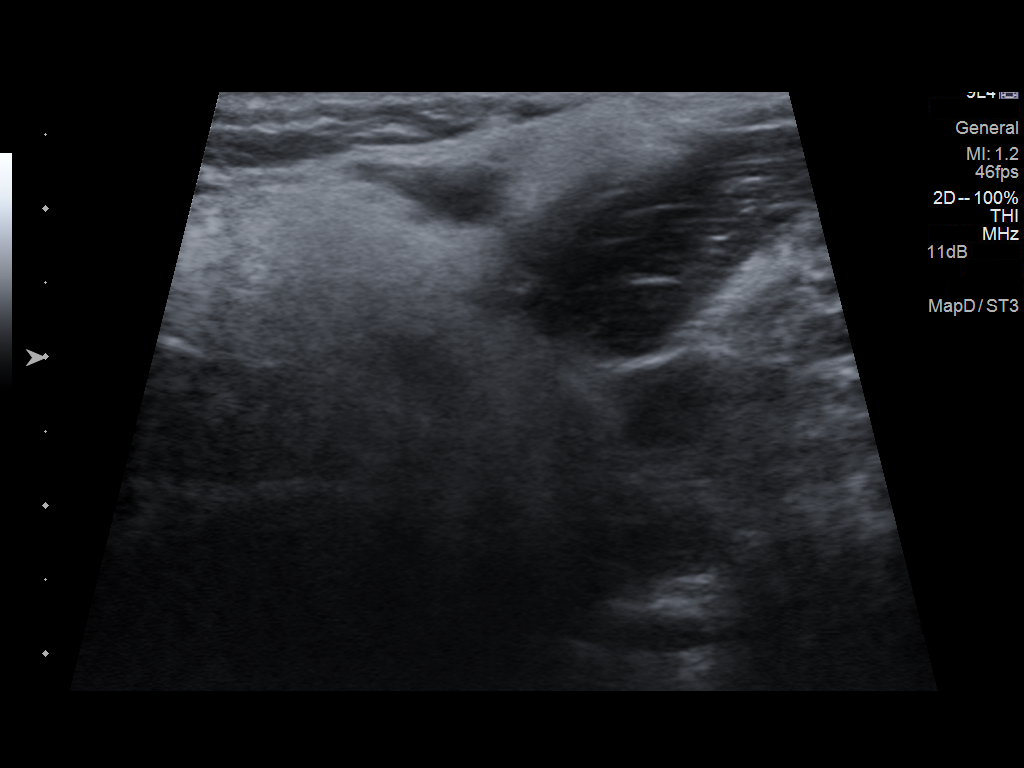

[6 of 6 positions shown; findings below may reference images not displayed]

PROCEDURE:
The procedure, risks, benefits, and alternatives were explained to
the patient. Questions regarding the procedure were encouraged and
answered. The patient understands and consents to the procedure.

Ultrasound survey of the left parotid performed with images stored
and sent to PACs.

The left neck was prepped with Betadine in a sterile fashion, and a
sterile drape was applied covering the operative field. A sterile
gown and sterile gloves were used for the procedure. Local
anesthesia was provided with 1% Lidocaine.

Once the patient is prepped and draped sterilely, skin and
subcutaneous tissues were generously infiltrated with 1% lidocaine
for local anesthesia. Using ultrasound guidance, an 18 gauge needle
was advanced into the cyst within the left parotid gland. The
contents were aspirated, with approximately 3-4 cc of brown thin
fluid aspirated.

The needle was removed, and a sterile bandage was placed.

The patient tolerated procedure well and remained hemodynamically
stable throughout.

No complications were encountered and no significant blood loss was
encountered.

COMPLICATIONS:
None.
FINDINGS: Cyst within the left parotid at the angle of the mandible.

Images during the case demonstrate placement of 18 gauge needle into
the cyst with aspiration and complete collapse of the cavity. 3-4 cc
of brown thin fluid removed.

Final image demonstrates no complicating features.
IMPRESSION: Status post left parotid cyst aspiration with the cyst contents sent
to the lab for cytology evaluation.

## 2015-09-25 ENCOUNTER — Encounter (HOSPITAL_COMMUNITY): Payer: Self-pay | Admitting: Medical

## 2015-09-25 ENCOUNTER — Ambulatory Visit (INDEPENDENT_AMBULATORY_CARE_PROVIDER_SITE_OTHER): Payer: BLUE CROSS/BLUE SHIELD | Admitting: Medical

## 2015-09-25 VITALS — BP 122/74 | HR 70 | Ht 74.0 in | Wt 361.0 lb

## 2015-09-25 DIAGNOSIS — F341 Dysthymic disorder: Secondary | ICD-10-CM

## 2015-09-25 DIAGNOSIS — F515 Nightmare disorder: Secondary | ICD-10-CM | POA: Diagnosis not present

## 2015-09-25 DIAGNOSIS — F4310 Post-traumatic stress disorder, unspecified: Secondary | ICD-10-CM | POA: Diagnosis not present

## 2015-09-25 DIAGNOSIS — F4312 Post-traumatic stress disorder, chronic: Secondary | ICD-10-CM

## 2015-09-25 DIAGNOSIS — F411 Generalized anxiety disorder: Secondary | ICD-10-CM

## 2015-09-25 MED ORDER — TRAZODONE HCL 50 MG PO TABS
50.0000 mg | ORAL_TABLET | Freq: Every day | ORAL | Status: AC
Start: 2015-09-25 — End: ?

## 2015-09-25 MED ORDER — ESCITALOPRAM OXALATE 20 MG PO TABS: 20.0000 mg | ORAL_TABLET | Freq: Every day | ORAL | Status: DC

## 2015-09-25 MED ORDER — PRAZOSIN HCL 2 MG PO CAPS: 1.0000 mg | ORAL_CAPSULE | Freq: Every day | ORAL | Status: AC

## 2015-09-25 NOTE — Progress Notes (Signed)
[facility] MD/PA/NP OP Progress Note  09/25/2015 5:51 pm [name]  MRN:  [id number]  Subjective:  " Can we go up on the medicines?" Chief Complaint:  Chief Complaint    Follow-up; Stress; Trauma; Anxiety; Depression     Visit Diagnosis:     ICD-9-CM ICD-10-CM   1. PTSD (post-traumatic stress disorder) 309.81 F43.10   2. Dysthymia 300.4 F34.1   3. Nightmares associated with chronic post-traumatic stress disorder 307.47 F51.5    309.81 F43.10   4. Morbid obesity, unspecified obesity type ([place]) 278.01 E66.01   5. Generalized anxiety disorder 300.02 F41.1     Past Medical History:  Past Medical History  Diagnosis Date  . Hyperlipidemia   . HTN (hypertension)   . Hyperglycemia   . Gallstones   . Arthritis   . DJD (degenerative joint disease)   . Obesity   . Diabetes mellitus ([place])   . Gout   . GERD (gastroesophageal reflux disease)   . Male hypogonadism 07/08/2015  . Depression 07/08/2015  . Diabetes mellitus, type II ([place])   . PONV (postoperative nausea and vomiting)     Past Surgical History  Procedure Laterality Date  . Repair knee ligament      Left  . Back cyst excision      x2  . Tennis elbow surgery, nerve entrapment      x2 right  . Shoulder surgery, reconstruction of joint      Left x 3, Last 02/2007  . Joint replacement  01/26/2011    right knee  . Right foot      BONES STRAIGHTENED OUT   2006  . Cholecystectomy  08/11/2011    Procedure: LAPAROSCOPIC CHOLECYSTECTOMY WITH INTRAOPERATIVE CHOLANGIOGRAM;  Surgeon: Judieth Keens, DO;  Location: [place];  Service: General;  Laterality: N/A;  Laparoscopic cholecystectomy with cholangiogram.  . Knee arthroscopy      x 2 left  . Knee arthroscopy      x 2 right  . Colonoscopy with propofol N/A 09/15/2015    Procedure: COLONOSCOPY WITH PROPOFOL;  Surgeon: Gatha Mayer, MD;  Location: [facility];  Service: Endoscopy;  Laterality: N/A;   Family History:  Family History  Problem Relation Age of Onset  . Sarcoidosis  Mother   . Colon cancer Brother 16    half-brother  . Cancer Neg Hx   . Heart disease Neg Hx   . Kidney disease Neg Hx   . Ovarian cancer Maternal Grandmother    Social History:  Social History   Social History  . Marital Status: Married    Spouse Name: N/A  . Number of Children: 1  . Years of Education: N/A   Occupational History  . [employer]   .     Social History Main Topics  . Smoking status: Never Smoker   . Smokeless tobacco: Never Used  . Alcohol Use: No     Comment: socially=occasional   . Drug Use: No  . Sexual Activity: Yes    Birth Control/ Protection: Condom   Other Topics Concern  . None   Social History Narrative   ** Merged History Encounter **       Works - Banker, out on medical leave x 1 year      Married 8 years - divorced 2004   Daughter 1988 - [employer] doing well. Has her own place   Monogamous relationships   Additional History:      Has counseling group at [name] for PTSD 10/16/15  H&P by Gatha Mayer, MD at 09/15/2015  9:42 AM  Version 1 of 1    Author: Gatha Mayer, MD Service: Gastroenterology Author Type: Physician    Filed: 09/15/2015 9:50 AM Note Time: 09/15/2015 9:42 AM Status: Signed    Editor: Gatha Mayer, MD (Physician)      Expand All Collapse All    [facility] History and Physical   Primary Care Physician:  Cathlean Cower, MD   Reason for Procedure:         Colon cancer screening w/ family hx - half-brother colon cancer at 29  Plan:                                       Colonoscopy - The risks and benefits as well as alternatives of endoscopic procedure(s) have been discussed and reviewed. All questions answered. The patient agrees to proceed.   HPI: [name] is a 50 y.o. male without GI complaints here for routine preventive colonoscopy. He has a half-brother with colon cancer at age 71. Negative Colonoscopy-see report in Results Review       Assessment: [name] is stable but not at goal  Good news is he  has not given up on [name] benefits as he was thinking at last visit  Musculoskeletal: Strength & Muscle Tone: within normal limits Gait & Station: normal Patient leans: N/A  Psychiatric Specialty Exam: HPI [name] returns for 1 month FU after initial consultation: 50 y/o BM Had been getting services at the [name] but hasn't been satisfied with treatment.  "I'm thinking I've got some PTSD going on.  I don't sleep.  My anger is a problem.  I go from 0 to 100 real quick.  My friends have backed away from me. I don't enjoy being around a lot of people. "  Believes his experience in the [place] changed him significantly.At first says "everything" about the [employer] was traumatic but then admits basic training was traumatic to him. He sought help from his PCP Dr Jenny Reichmann who started him on Lexapro 10 mg about a month ago with om nly a partial response.      Patients Currently Reported Symptoms/Problems: "I can't be the man that I need to be for my family.  I can't enjoy playing with my 43 year old grandson."    "One thing I am good at is I try to be a good provider."     Today he reports medication needs to be increased but he displays a partial response in his affect and attitude -not giving up on the [name] and much less psychomotor retardation.    ROS ROS Review of Systems  Constitutional:Morbid obesity. Negative for diaphoresis, fever,chills HENT: Negative for worsening hearing loss, ear pain, facial swelling, mouth sores and neck stiffness.    Cardiovascular: Negative for chest pain and palpitations.   Gastrointestinal: Negative for diarrhea, blood in stool, abdominal distention or other pain Genitourinary: Negative for hematuria, flank pain or change in urine volume.   Musculoskeletal: Negative for myalgias or other joint complaints.     Neurological: Negative for syncope and numbness.  Psychiatric/Behavioral: Negative for hallucinations, SI, self-injury.Denies substance abuse;social alcohol only;C/O service  connected PTSD with nightmares not controlled with 1 mg Minipres;Anxious depression not controlled with 10 mg of Lexapro.    Blood pressure 122/74, pulse 70, height 6\' 2"  (1.88 m), weight 361 lb (163.749 kg),  SpO2 96 %.Body mass index is 46.33 kg/(m^2).  General Appearance: Fairly Groomed and obese  Eye Contact:  Good  Speech:  Clear and Coherent and Normal Rate  Volume:  Normal  Mood:  Euthymic  Affect:  Congruent  Thought Process:  Coherent  Orientation:  Full (Time, Place, and Person)  Thought Content:  WDL and Rumination  Suicidal Thoughts:  No  Homicidal Thoughts:  No  Memory:  Traumatic  Judgement:  Impaired  Insight:  Lacking  Psychomotor Activity:  Normal and improved from prior visit  Concentration:  Good  Recall:  Good  Fund of Knowledge: Fair  Language: Fair  Akathisia:  NA  Handed:  Right  AIMS (if indicated):  NA  Assets:  Desire for Improvement Financial Resources/Insurance Social Support Talents/Skills Transportation Vocational/Educational  ADL's:  Intact  Cognition: Impaired,  Moderate PTSD  Sleep:  Nightmares   Is the patient at risk to self?  No. Has the patient been a risk to self in the past 6 months?  No. Has the patient been a risk to self within the distant past?  No. Is the patient a risk to others?  No. Has the patient been a risk to others in the past 6 months?  No. Has the patient been a risk to others within the distant past?  No.  Current Medications: Current Outpatient Prescriptions  Medication Sig Dispense Refill  . cromolyn (OPTICROM) 4 % ophthalmic solution Place 1 drop into both eyes 2 (two) times daily.    [name] HYDROcodone-acetaminophen (NORCO/VICODIN) 5-325 MG tablet Take 1 tablet by mouth every 6 (six) hours as needed for moderate pain. 40 tablet 0  . indomethacin (INDOCIN) 50 MG capsule TAKE ONE CAPSULE EVERY 6 HOURS AS NEEDED GOUT ONLY 60 capsule 2  . meloxicam (MOBIC) 15 MG tablet Take 1 tablet (15 mg total) by mouth daily. 30  tablet 5  . metFORMIN (GLUCOPHAGE) 500 MG tablet TAKE 1 TABLET (500 MG TOTAL) BY MOUTH 2 (TWO) TIMES DAILY WITH A MEAL. 180 tablet 3  . olmesartan (BENICAR) 20 MG tablet TAKE 1 TABLET (20 MG TOTAL) BY MOUTH DAILY. 90 tablet 3  . omeprazole (PRILOSEC) 40 MG capsule Take 40 mg by mouth daily.  4  . prazosin (MINIPRESS) 2 MG capsule Take 1 capsule (2 mg total) by mouth at bedtime. 30 capsule 1  . testosterone (ANDROGEL) 50 MG/5GM (1%) GEL Place 5 g onto the skin daily. 450 g 1  . vardenafil (LEVITRA) 20 MG tablet Take 1 tablet (20 mg total) by mouth daily as needed for erectile dysfunction. 6 tablet 5  . escitalopram (LEXAPRO) 20 MG tablet Take 1 tablet (20 mg total) by mouth daily. 30 tablet 2  . traZODone (DESYREL) 50 MG tablet Take 1 tablet (50 mg total) by mouth at bedtime. 30 tablet 1   No current facility-administered medications for this visit.    Medical Decision Making:  Established Problem, Stable/Improving (1), Review of Last Therapy Session (1) and Review of Medication Regimen & Side Effects (2)  Treatment Plan Summary:Medication management and continue counseling at [facility] 1 month   Darlyne Russian 09/25/2015 5:51 pm

## 2015-10-01 ENCOUNTER — Encounter (HOSPITAL_COMMUNITY): Payer: Self-pay | Admitting: Medical

## 2015-10-04 ENCOUNTER — Other Ambulatory Visit (HOSPITAL_COMMUNITY): Payer: Self-pay | Admitting: Medical

## 2015-10-23 ENCOUNTER — Ambulatory Visit (HOSPITAL_COMMUNITY): Payer: Self-pay | Admitting: Medical

## 2015-11-25 ENCOUNTER — Telehealth (HOSPITAL_COMMUNITY): Payer: Self-pay

## 2015-11-25 NOTE — Telephone Encounter (Signed)
Fax received for a refill on patients trazodone. He was last here on 3/16, he canceled 4/13 appointment and has not rescheduled, would you like to refill? Please review and advise, thank you

## 2015-11-26 NOTE — Telephone Encounter (Signed)
Called pt to schedule appt. Pt states he will return call to office later today to schedule appt. Informed pt he will need to be seen by provider before any refills could be sent to pharmacy. Pt verbalizes understanding. I will f/u with pt later today.

## 2015-11-26 NOTE — Telephone Encounter (Signed)
Jovita Please contact pt and see if he is coming back-I cant refill meds on patients I dont see-if he schedules FU appt Rx enough Trazodone til visit otherwise no refill Thanks

## 2015-11-26 NOTE — Telephone Encounter (Signed)
If he schedules send rx for enough meds until visit thanks

## 2015-11-27 NOTE — Telephone Encounter (Signed)
Received faxed from [facility] for a refill for pt's Trazodone. LVM for pt to contact office to schedule appt.

## 2015-11-29 ENCOUNTER — Other Ambulatory Visit: Payer: Self-pay | Admitting: Internal Medicine

## 2015-11-29 ENCOUNTER — Other Ambulatory Visit (HOSPITAL_COMMUNITY): Payer: Self-pay | Admitting: Medical

## 2016-01-08 ENCOUNTER — Encounter: Payer: Self-pay | Admitting: Internal Medicine

## 2016-01-08 ENCOUNTER — Ambulatory Visit (INDEPENDENT_AMBULATORY_CARE_PROVIDER_SITE_OTHER): Payer: BLUE CROSS/BLUE SHIELD | Admitting: Internal Medicine

## 2016-01-08 VITALS — BP 120/72 | HR 96 | Temp 98.4°F | Resp 20 | Wt 337.0 lb

## 2016-01-08 DIAGNOSIS — Z0001 Encounter for general adult medical examination with abnormal findings: Secondary | ICD-10-CM

## 2016-01-08 DIAGNOSIS — E119 Type 2 diabetes mellitus without complications: Secondary | ICD-10-CM

## 2016-01-08 DIAGNOSIS — E785 Hyperlipidemia, unspecified: Secondary | ICD-10-CM

## 2016-01-08 DIAGNOSIS — N509 Disorder of male genital organs, unspecified: Secondary | ICD-10-CM | POA: Diagnosis not present

## 2016-01-08 DIAGNOSIS — I10 Essential (primary) hypertension: Secondary | ICD-10-CM | POA: Diagnosis not present

## 2016-01-08 DIAGNOSIS — R6889 Other general symptoms and signs: Secondary | ICD-10-CM

## 2016-01-08 DIAGNOSIS — N5089 Other specified disorders of the male genital organs: Secondary | ICD-10-CM | POA: Insufficient documentation

## 2016-01-08 NOTE — Patient Instructions (Signed)
Please continue all other medications as before, and refills have been done if requested.  Please have the pharmacy call with any other refills you may need.  Please continue your efforts at being more active, low cholesterol diet, and weight control.  Please keep your appointments with your specialists as you may have planned  You will be contacted regarding the referral for: ultrasound as discussed  Please go to the LAB in the Basement (turn left off the elevator) for the tests to be done at hour convenience  You will be contacted by phone if any changes need to be made immediately.  Otherwise, you will receive a letter about your results with an explanation, but please check with MyChart first.  Please remember to sign up for MyChart if you have not done so, as this will be important to you in the future with finding out test results, communicating by private email, and scheduling acute appointments online when needed.  Please return in 6 months, or sooner if needed, with Lab testing done 3-5 days before

## 2016-01-08 NOTE — Assessment & Plan Note (Signed)
stable overall by history and exam, recent data reviewed with pt, and pt to continue medical treatment as before,  to f/u any worsening symptoms or concerns BP Readings from Last 3 Encounters:  01/08/16 120/72  09/25/15 122/74  09/15/15 89/50

## 2016-01-08 NOTE — Assessment & Plan Note (Signed)
Mild to mod, likely cystic mass, vs hydrocele, varicocele, for scrotal u/s,  to f/u any worsening symptoms or concerns

## 2016-01-08 NOTE — Progress Notes (Signed)
Pre visit review using our clinic review tool, if applicable. No additional management support is needed unless otherwise documented below in the visit note. 

## 2016-01-08 NOTE — Assessment & Plan Note (Signed)
stable overall by history and exam, recent data reviewed with pt, and pt to continue medical treatment as before,  to f/u any worsening symptoms or concerns Lab Results  Component Value Date   LDLCALC 111* 07/08/2015

## 2016-01-08 NOTE — Assessment & Plan Note (Signed)
stable overall by history and exam, recent data reviewed with pt, and pt to continue medical treatment as before,  to f/u any worsening symptoms or concerns Lab Results  Component Value Date   HGBA1C 6.3 07/08/2015

## 2016-01-08 NOTE — Progress Notes (Signed)
Subjective:    Patient ID: [name], male    DOB: 27-Mar-1966, 50 y.o.   MRN: [id number]  HPI  Here with 3 days onset noticed left scrotal mass, about grape sized, firm but not hard, not sure if really enlarged, just noticed in the shower.  No fever or scrotum changes, Denies urinary symptoms such as dysuria, frequency, urgency, flank pain, hematuria or n/v, fever, chills.  Very worried about prostate cancer he read about online.  Pt denies chest pain, increased sob or doe, wheezing, orthopnea, PND, increased LE swelling, palpitations, dizziness or syncope.  Pt denies new neurological symptoms such as new headache, or facial or extremity weakness or numbness   Pt denies polydipsia, polyuria, or low sugar symptoms such as weakness or confusion improved with po intake.  Pt states overall good compliance with meds, trying to follow lower cholesterol, diabetic diet, wt overall stable but little exercise however.   Lost wt with better diet. Wt Readings from Last 3 Encounters:  01/08/16 337 lb (152.862 kg)  09/25/15 361 lb (163.749 kg)  09/15/15 356 lb (161.481 kg)   Past Medical History  Diagnosis Date  . Hyperlipidemia   . HTN (hypertension)   . Hyperglycemia   . Gallstones   . Arthritis   . DJD (degenerative joint disease)   . Obesity   . Diabetes mellitus ([place])   . Gout   . GERD (gastroesophageal reflux disease)   . Male hypogonadism 07/08/2015  . Depression 07/08/2015  . Diabetes mellitus, type II ([place])   . PONV (postoperative nausea and vomiting)    Past Surgical History  Procedure Laterality Date  . Repair knee ligament      Left  . Back cyst excision      x2  . Tennis elbow surgery, nerve entrapment      x2 right  . Shoulder surgery, reconstruction of joint      Left x 3, Last 02/2007  . Joint replacement  01/26/2011    right knee  . Right foot      BONES STRAIGHTENED OUT   2006  . Cholecystectomy  08/11/2011    Procedure: LAPAROSCOPIC CHOLECYSTECTOMY WITH INTRAOPERATIVE  CHOLANGIOGRAM;  Surgeon: Judieth Keens, DO;  Location: [facility];  Service: General;  Laterality: N/A;  Laparoscopic cholecystectomy with cholangiogram.  . Knee arthroscopy      x 2 left  . Knee arthroscopy      x 2 right  . Colonoscopy with propofol N/A 09/15/2015    Procedure: COLONOSCOPY WITH PROPOFOL;  Surgeon: Gatha Mayer, MD;  Location: [facility];  Service: Endoscopy;  Laterality: N/A;    reports that he has never smoked. He has never used smokeless tobacco. He reports that he does not drink alcohol or use illicit drugs. family history includes Colon cancer (age of onset: 22) in his brother; Ovarian cancer in his maternal grandmother; Sarcoidosis in his mother. There is no history of Cancer, Heart disease, or Kidney disease. No Known Allergies Current Outpatient Prescriptions on File Prior to Visit  Medication Sig Dispense Refill  . cromolyn (OPTICROM) 4 % ophthalmic solution Place 1 drop into both eyes 2 (two) times daily.    [name] escitalopram (LEXAPRO) 20 MG tablet Take 1 tablet (20 mg total) by mouth daily. 30 tablet 2  . HYDROcodone-acetaminophen (NORCO/VICODIN) 5-325 MG tablet Take 1 tablet by mouth every 6 (six) hours as needed for moderate pain. 40 tablet 0  . indomethacin (INDOCIN) 50 MG capsule TAKE ONE CAPSULE EVERY 6  HOURS AS NEEDED GOUT ONLY 60 capsule 2  . meloxicam (MOBIC) 15 MG tablet TAKE 1 TABLET (15 MG TOTAL) BY MOUTH DAILY. 30 tablet 5  . metFORMIN (GLUCOPHAGE) 500 MG tablet TAKE 1 TABLET (500 MG TOTAL) BY MOUTH 2 (TWO) TIMES DAILY WITH A MEAL. 180 tablet 3  . olmesartan (BENICAR) 20 MG tablet TAKE 1 TABLET (20 MG TOTAL) BY MOUTH DAILY. 90 tablet 3  . omeprazole (PRILOSEC) 40 MG capsule Take 40 mg by mouth daily.  4  . prazosin (MINIPRESS) 2 MG capsule Take 1 capsule (2 mg total) by mouth at bedtime. 30 capsule 1  . testosterone (ANDROGEL) 50 MG/5GM (1%) GEL Place 5 g onto the skin daily. 450 g 1  . traZODone (DESYREL) 50 MG tablet Take 1 tablet (50 mg total) by  mouth at bedtime. 30 tablet 1  . vardenafil (LEVITRA) 20 MG tablet Take 1 tablet (20 mg total) by mouth daily as needed for erectile dysfunction. 6 tablet 5   No current facility-administered medications on file prior to visit.   Review of Systems  Constitutional: Negative for unusual diaphoresis or night sweats HENT: Negative for ear swelling or discharge Eyes: Negative for worsening visual haziness  Respiratory: Negative for choking and stridor.   Gastrointestinal: Negative for distension or worsening eructation Genitourinary: Negative for retention or change in urine volume.  Musculoskeletal: Negative for other MSK pain or swelling Skin: Negative for color change and worsening wound Neurological: Negative for tremors and numbness other than noted  Psychiatric/Behavioral: Negative for decreased concentration or agitation other than above       Objective:   Physical Exam BP 120/72 mmHg  Pulse 96  Temp(Src) 98.4 F (36.9 C) (Oral)  Resp 20  Wt 337 lb (152.862 kg)  SpO2 96% VS noted,  Constitutional: Pt appears in no apparent distress HENT: Head: NCAT.  Right Ear: External ear normal.  Left Ear: External ear normal.  Eyes: . Pupils are equal, round, and reactive to light. Conjunctivae and EOM are normal Neck: Normal range of motion. Neck supple.  Cardiovascular: Normal rate and regular rhythm.   Pulmonary/Chest: Effort normal and breath sounds without rales or wheezing.  Abd:  Soft, NT, ND, + BS Neurological: Pt is alert. Not confused , motor grossly intact Skin: Skin is warm. No rash, no LE edema Psychiatric: Pt behavior is normal. No agitation.  Scrotum; no skin erythema, swelling or other changes, testicles NT and normal size, did have just under grape sized firm but not hard mass above left testicle      Assessment & Plan:

## 2016-01-09 ENCOUNTER — Other Ambulatory Visit (INDEPENDENT_AMBULATORY_CARE_PROVIDER_SITE_OTHER): Payer: BLUE CROSS/BLUE SHIELD

## 2016-01-09 DIAGNOSIS — E119 Type 2 diabetes mellitus without complications: Secondary | ICD-10-CM | POA: Diagnosis not present

## 2016-01-09 LAB — BASIC METABOLIC PANEL
BUN: 11 mg/dL (ref 6–23)
CALCIUM: 9.5 mg/dL (ref 8.4–10.5)
CO2: 28 meq/L (ref 19–32)
CREATININE: 0.84 mg/dL (ref 0.40–1.50)
Chloride: 104 mEq/L (ref 96–112)
GFR: 123.83 mL/min (ref >60.00)
GLUCOSE: 99 mg/dL (ref 70–99)
Potassium: 4.2 mEq/L (ref 3.5–5.1)
Sodium: 137 mEq/L (ref 135–145)

## 2016-01-09 LAB — LIPID PANEL
Cholesterol: 157 mg/dL (ref 0–200)
HDL: 40.6 mg/dL (ref >39.00)
LDL CALC: 108 mg/dL — AB (ref 0–99)
NONHDL: 115.95
Total CHOL/HDL Ratio: 4
Triglycerides: 42 mg/dL (ref 0.0–149.0)
VLDL: 8.4 mg/dL (ref 0.0–40.0)

## 2016-01-09 LAB — HEPATIC FUNCTION PANEL
ALBUMIN: 4.2 g/dL (ref 3.5–5.2)
ALT: 18 U/L (ref 0–53)
AST: 14 U/L (ref 0–37)
Alkaline Phosphatase: 44 U/L (ref 39–117)
BILIRUBIN DIRECT: 0.1 mg/dL (ref 0.0–0.3)
TOTAL PROTEIN: 7.5 g/dL (ref 6.0–8.3)
Total Bilirubin: 0.9 mg/dL (ref 0.2–1.2)

## 2016-01-09 LAB — MICROALBUMIN / CREATININE URINE RATIO
CREATININE, U: 140.3 mg/dL
MICROALB/CREAT RATIO: 0.5 mg/g (ref 0.0–30.0)
Microalb, Ur: 0.7 mg/dL (ref 0.0–1.9)

## 2016-01-09 LAB — HEMOGLOBIN A1C: Hgb A1c MFr Bld: 5.8 % (ref 4.6–6.5)

## 2016-01-15 ENCOUNTER — Telehealth: Payer: Self-pay | Admitting: Internal Medicine

## 2016-01-15 DIAGNOSIS — N5089 Other specified disorders of the male genital organs: Secondary | ICD-10-CM

## 2016-01-15 NOTE — Telephone Encounter (Signed)
Please advise 

## 2016-01-15 NOTE — Telephone Encounter (Signed)
[facility] called stating they needing IMG [id number] added to his US Scrotum

## 2016-01-16 NOTE — Telephone Encounter (Signed)
This has been done.

## 2016-01-27 ENCOUNTER — Ambulatory Visit
Admission: RE | Admit: 2016-01-27 | Discharge: 2016-01-27 | Disposition: A | Discharge status: Home / Self Care | Payer: BLUE CROSS/BLUE SHIELD | Source: Ambulatory Visit | Attending: Internal Medicine | Admitting: Internal Medicine

## 2016-01-27 DIAGNOSIS — N5089 Other specified disorders of the male genital organs: Secondary | ICD-10-CM

## 2016-02-14 ENCOUNTER — Other Ambulatory Visit: Payer: Self-pay | Admitting: Internal Medicine

## 2016-03-18 ENCOUNTER — Telehealth: Payer: Self-pay | Admitting: *Deleted

## 2016-03-18 MED ORDER — HYDROCODONE-ACETAMINOPHEN 5-325 MG PO TABS: 1.0000 | ORAL_TABLET | Freq: Four times a day (QID) | ORAL | 0 refills | Status: DC | PRN

## 2016-03-18 NOTE — Telephone Encounter (Signed)
Done hardcopy to Corinne  

## 2016-03-18 NOTE — Telephone Encounter (Signed)
Rec'd call pt is requesting refill on pain med hydrocodone for arthritis in his knee...[name]

## 2016-03-18 NOTE — Telephone Encounter (Signed)
Patient aware ready for pickup 

## 2016-04-15 ENCOUNTER — Ambulatory Visit (INDEPENDENT_AMBULATORY_CARE_PROVIDER_SITE_OTHER): Payer: BLUE CROSS/BLUE SHIELD | Admitting: Internal Medicine

## 2016-04-15 VITALS — BP 122/80 | HR 67 | Temp 98.2°F | Resp 20 | Wt 319.0 lb

## 2016-04-15 DIAGNOSIS — R21 Rash and other nonspecific skin eruption: Secondary | ICD-10-CM | POA: Diagnosis not present

## 2016-04-15 DIAGNOSIS — E119 Type 2 diabetes mellitus without complications: Secondary | ICD-10-CM

## 2016-04-15 DIAGNOSIS — N509 Disorder of male genital organs, unspecified: Secondary | ICD-10-CM

## 2016-04-15 DIAGNOSIS — I10 Essential (primary) hypertension: Secondary | ICD-10-CM

## 2016-04-15 DIAGNOSIS — N5089 Other specified disorders of the male genital organs: Secondary | ICD-10-CM

## 2016-04-15 DIAGNOSIS — M25562 Pain in left knee: Secondary | ICD-10-CM

## 2016-04-15 MED ORDER — HYDROCODONE-ACETAMINOPHEN 5-325 MG PO TABS: 1.0000 | ORAL_TABLET | Freq: Four times a day (QID) | ORAL | 0 refills | Status: DC | PRN

## 2016-04-15 NOTE — Progress Notes (Signed)
Subjective:    Patient ID: [name], male    DOB: 1966-04-03, 50 y.o.   MRN: [id number]  HPI  Here to f/u, concerned about recent testicle u/s since mass still persists, but Denies urinary symptoms such as dysuria, frequency, urgency, flank pain, hematuria or n/v, fever, chills.  Has had significant wt loss with efforts - Has been on low carb, low sugar, more active with successful wt loss. Wt Readings from Last 3 Encounters:  04/15/16 (!) 319 lb (144.7 kg)  01/08/16 (!) 337 lb (152.9 kg)  09/15/15 (!) 356 lb (161.5 kg)  Unfortuantely has worsening left knee pain mild to mod intermittent related to more walking for exercise, better to rest, worse to walk longer distances.  Also has rash to post neck with mult bumps, non tender but ongoing for months, just does not seem to improve, does shave the area occasionally.  Pt denies chest pain, increased sob or doe, wheezing, orthopnea, PND, increased LE swelling, palpitations, dizziness or syncope. Past Medical History:  Diagnosis Date  . Arthritis   . Depression 07/08/2015  . Diabetes mellitus ([place])   . Diabetes mellitus, type II ([place])   . DJD (degenerative joint disease)   . Gallstones   . GERD (gastroesophageal reflux disease)   . Gout   . HTN (hypertension)   . Hyperglycemia   . Hyperlipidemia   . Male hypogonadism 07/08/2015  . Obesity   . PONV (postoperative nausea and vomiting)    Past Surgical History:  Procedure Laterality Date  . Back Cyst Excision     x2  . CHOLECYSTECTOMY  08/11/2011   Procedure: LAPAROSCOPIC CHOLECYSTECTOMY WITH INTRAOPERATIVE CHOLANGIOGRAM;  Surgeon: Judieth Keens, DO;  Location: [facility];  Service: General;  Laterality: N/A;  Laparoscopic cholecystectomy with cholangiogram.  . COLONOSCOPY WITH PROPOFOL N/A 09/15/2015   Procedure: COLONOSCOPY WITH PROPOFOL;  Surgeon: Gatha Mayer, MD;  [facility];  Service: Endoscopy;  Laterality: N/A;  . JOINT REPLACEMENT  01/26/2011   right knee  . KNEE  ARTHROSCOPY     x 2 left  . KNEE ARTHROSCOPY     x 2 right  . REPAIR KNEE LIGAMENT     Left  . RIGHT FOOT     BONES STRAIGHTENED OUT   2006  . Shoulder Surgery, Reconstruction of Joint     Left x 3, Last 02/2007  . Tennis elbow surgery, Nerve entrapment     x2 right    reports that he has never smoked. He has never used smokeless tobacco. He reports that he does not drink alcohol or use drugs. family history includes Colon cancer (age of onset: 4) in his brother; Ovarian cancer in his maternal grandmother; Sarcoidosis in his mother. No Known Allergies Current Outpatient Prescriptions on File Prior to Visit  Medication Sig Dispense Refill  . cromolyn (OPTICROM) 4 % ophthalmic solution Place 1 drop into both eyes 2 (two) times daily.    [name] escitalopram (LEXAPRO) 20 MG tablet Take 1 tablet (20 mg total) by mouth daily. 30 tablet 2  . indomethacin (INDOCIN) 50 MG capsule TAKE ONE CAPSULE EVERY 6 HOURS AS NEEDED GOUT ONLY 60 capsule 2  . meloxicam (MOBIC) 15 MG tablet TAKE 1 TABLET (15 MG TOTAL) BY MOUTH DAILY. 30 tablet 5  . metFORMIN (GLUCOPHAGE) 500 MG tablet TAKE 1 TABLET (500 MG TOTAL) BY MOUTH 2 (TWO) TIMES DAILY WITH A MEAL. 180 tablet 3  . metFORMIN (GLUCOPHAGE) 500 MG tablet TAKE 1 TABLET (500 MG TOTAL)  BY MOUTH 2 (TWO) TIMES DAILY WITH A MEAL. 180 tablet 2  . olmesartan (BENICAR) 20 MG tablet TAKE 1 TABLET (20 MG TOTAL) BY MOUTH DAILY. 90 tablet 3  . omeprazole (PRILOSEC) 40 MG capsule Take 40 mg by mouth daily.  4  . prazosin (MINIPRESS) 2 MG capsule Take 1 capsule (2 mg total) by mouth at bedtime. 30 capsule 1  . testosterone (ANDROGEL) 50 MG/5GM (1%) GEL Place 5 g onto the skin daily. 450 g 1  . traZODone (DESYREL) 50 MG tablet Take 1 tablet (50 mg total) by mouth at bedtime. 30 tablet 1  . vardenafil (LEVITRA) 20 MG tablet Take 1 tablet (20 mg total) by mouth daily as needed for erectile dysfunction. 6 tablet 5   No current facility-administered medications on file prior to  visit.    Review of Systems  Constitutional: Negative for unusual diaphoresis or night sweats HENT: Negative for ear swelling or discharge Eyes: Negative for worsening visual haziness  Respiratory: Negative for choking and stridor.   Gastrointestinal: Negative for distension or worsening eructation Genitourinary: Negative for retention or change in urine volume.  Musculoskeletal: Negative for other MSK pain or swelling Skin: Negative for color change and worsening wound Neurological: Negative for tremors and numbness other than noted  Psychiatric/Behavioral: Negative for decreased concentration or agitation other than above       Objective:   Physical Exam BP 122/80   Pulse 67   Temp 98.2 F (36.8 C) (Oral)   Resp 20   Wt (!) 319 lb (144.7 kg)   SpO2 98%   BMI 40.96 kg/m  VS noted, obese Constitutional: Pt appears in no apparent distress HENT: Head: NCAT.  Right Ear: External ear normal.  Left Ear: External ear normal.  Eyes: . Pupils are equal, round, and reactive to light. Conjunctivae and EOM are normal Neck: Normal range of motion. Neck supple.  Cardiovascular: Normal rate and regular rhythm.   Pulmonary/Chest: Effort normal and breath sounds without rales or wheezing.  Left knee with crepitus, small effusion, FROM, NT Neurological: Pt is alert. Not confused , motor grossly intact Skin: Skin is warm. no LE edema, has numerous nontender < 5 mm skin colored bumps several mm high, without significant erythema, no ulcers Psychiatric: Pt behavior is normal. No agitation.     Assessment & Plan:

## 2016-04-15 NOTE — Assessment & Plan Note (Signed)
U/s c/w probable epididymal cyst, d/w pt, c/w benign lesion,  to f/u any worsening symptoms or concerns

## 2016-04-15 NOTE — Patient Instructions (Addendum)
You had the flu shot today, and the Pneumovax shot today (pneumonia shot)  Please continue all other medications as before, and refills have been done if requested - the pain medication  Please have the pharmacy call with any other refills you may need.  Please continue your efforts at being more active, low cholesterol diet, and weight control.  Please keep your appointments with your specialists as you may have planned  You will be contacted regarding the referral for: Dermatology  Please return about early December 2017, or sooner if needed, with Lab testing done 3-5 days before

## 2016-04-15 NOTE — Assessment & Plan Note (Signed)
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

## 2016-04-15 NOTE — Assessment & Plan Note (Signed)
stable overall by history and exam, recent data reviewed with pt, and pt to continue medical treatment as before,  to f/u any worsening symptoms or concerns BP Readings from Last 3 Encounters:  04/15/16 122/80  01/08/16 120/72  09/15/15 (!) 89/50

## 2016-04-15 NOTE — Assessment & Plan Note (Signed)
stable overall by history and exam, recent data reviewed with pt, and pt to continue medical treatment as before,  to f/u any worsening symptoms or concerns Lab Results  Component Value Date   HGBA1C 5.8 01/09/2016    

## 2016-04-15 NOTE — Assessment & Plan Note (Signed)
C/w likely djd, for pain med refill, to f/u with sport med in this office

## 2016-04-15 NOTE — Progress Notes (Signed)
Pre visit review using our clinic review tool, if applicable. No additional management support is needed unless otherwise documented below in the visit note. 

## 2016-08-12 ENCOUNTER — Other Ambulatory Visit: Payer: Self-pay | Admitting: Internal Medicine

## 2016-09-07 ENCOUNTER — Ambulatory Visit (INDEPENDENT_AMBULATORY_CARE_PROVIDER_SITE_OTHER): Payer: BLUE CROSS/BLUE SHIELD | Admitting: Internal Medicine

## 2016-09-07 ENCOUNTER — Other Ambulatory Visit: Payer: Self-pay | Admitting: Internal Medicine

## 2016-09-07 ENCOUNTER — Encounter: Payer: Self-pay | Admitting: Internal Medicine

## 2016-09-07 VITALS — BP 106/86 | HR 86 | Temp 97.6°F | Ht 74.0 in | Wt 324.0 lb

## 2016-09-07 DIAGNOSIS — M545 Low back pain, unspecified: Secondary | ICD-10-CM

## 2016-09-07 DIAGNOSIS — E119 Type 2 diabetes mellitus without complications: Secondary | ICD-10-CM

## 2016-09-07 DIAGNOSIS — F411 Generalized anxiety disorder: Secondary | ICD-10-CM | POA: Diagnosis not present

## 2016-09-07 DIAGNOSIS — I10 Essential (primary) hypertension: Secondary | ICD-10-CM | POA: Diagnosis not present

## 2016-09-07 MED ORDER — CYCLOBENZAPRINE HCL 5 MG PO TABS: 5.0000 mg | ORAL_TABLET | Freq: Three times a day (TID) | ORAL | 1 refills | Status: DC | PRN

## 2016-09-07 MED ORDER — HYDROCODONE-ACETAMINOPHEN 5-325 MG PO TABS: 1.0000 | ORAL_TABLET | Freq: Four times a day (QID) | ORAL | 0 refills | Status: DC | PRN

## 2016-09-07 NOTE — Patient Instructions (Addendum)
Please take all new medication as prescribed - the flexeril for muscle spasm  Please continue all other medications as before, and refills have been done if requested - the hydrocodone  Please consider looking into Psychiatric Disability by applying at the [place] on [address]  Please have the pharmacy call with any other refills you may need.  Please keep your appointments with your specialists as you may have planned  Please return in 6 months, or sooner if needed

## 2016-09-07 NOTE — Progress Notes (Signed)
Subjective:    Patient ID: [name], male    DOB: 03-19-1966, 51 y.o.   MRN: [id number]  HPI  Here to f/u -   Has been seen at [facility] with epigastric pain, had ecg, stress, cxr and even cardiac cath feb 13 at [facility] in [place]. Stil having some pain dull with radiation to the back, worse to lie down, better to sit up, has wakes him up sometimes. No new medications for cardiac, and cont's prilosec 40 qd.  [facility], and for PTSD follow up with planned stay at [facility] he thinks for maybe 2 wks.  Has had worsening depressive symptoms, and some suicidal ideation but got rid of the gun himself so he would not do this,; has ongoing anxiety, still with nightmares and difficulty sleeping, prefers care at the [name] for this.  "My body hurts all the time", just cant be around people, has stated at times feeling like "giving up." Today c/o localized pain acutely worse to the left lower back, no radiation, mod to severe at night especially to move in bed, nothing makes better., intermittent, and Pt continues to have recurring back pain without bowel or bladder change, fever, wt loss,  worsening LE pain/numbness/weakness, gait change or falls.  No other pain med from [name].   Works at [employer] with occasional lifting up to 7 days per wk. Past Medical History:  Diagnosis Date  . Arthritis   . Depression 07/08/2015  . Diabetes mellitus ([place])   . Diabetes mellitus, type II ([place])   . DJD (degenerative joint disease)   . Gallstones   . GERD (gastroesophageal reflux disease)   . Gout   . HTN (hypertension)   . Hyperglycemia   . Hyperlipidemia   . Male hypogonadism 07/08/2015  . Obesity   . PONV (postoperative nausea and vomiting)    Past Surgical History:  Procedure Laterality Date  . Back Cyst Excision     x2  . CHOLECYSTECTOMY  08/11/2011   Procedure: LAPAROSCOPIC CHOLECYSTECTOMY WITH INTRAOPERATIVE CHOLANGIOGRAM;  Surgeon: Judieth Keens, DO;  Location: [facility];   Service: General;  Laterality: N/A;  Laparoscopic cholecystectomy with cholangiogram.  . COLONOSCOPY WITH PROPOFOL N/A 09/15/2015   Procedure: COLONOSCOPY WITH PROPOFOL;  Surgeon: Gatha Mayer, MD;  [facility];  Service: Endoscopy;  Laterality: N/A;  . JOINT REPLACEMENT  01/26/2011   right knee  . KNEE ARTHROSCOPY     x 2 left  . KNEE ARTHROSCOPY     x 2 right  . REPAIR KNEE LIGAMENT     Left  . RIGHT FOOT     BONES STRAIGHTENED OUT   2006  . Shoulder Surgery, Reconstruction of Joint     Left x 3, Last 02/2007  . Tennis elbow surgery, Nerve entrapment     x2 right    reports that he has never smoked. He has never used smokeless tobacco. He reports that he does not drink alcohol or use drugs. family history includes Colon cancer (age of onset: 53) in his brother; Ovarian cancer in his maternal grandmother; Sarcoidosis in his mother. No Known Allergies Current Outpatient Prescriptions on File Prior to Visit  Medication Sig Dispense Refill  . cromolyn (OPTICROM) 4 % ophthalmic solution Place 1 drop into both eyes 2 (two) times daily.    [name] escitalopram (LEXAPRO) 10 MG tablet TAKE 1 TABLET (10 MG TOTAL) BY MOUTH DAILY. 90 tablet 0  . escitalopram (LEXAPRO)  20 MG tablet Take 1 tablet (20 mg total) by mouth daily. 30 tablet 2  . indomethacin (INDOCIN) 50 MG capsule TAKE ONE CAPSULE EVERY 6 HOURS AS NEEDED GOUT ONLY 60 capsule 2  . meloxicam (MOBIC) 15 MG tablet TAKE 1 TABLET (15 MG TOTAL) BY MOUTH DAILY. 30 tablet 5  . metFORMIN (GLUCOPHAGE) 500 MG tablet TAKE 1 TABLET (500 MG TOTAL) BY MOUTH 2 (TWO) TIMES DAILY WITH A MEAL. 180 tablet 3  . omeprazole (PRILOSEC) 40 MG capsule Take 40 mg by mouth daily.  4  . prazosin (MINIPRESS) 2 MG capsule Take 1 capsule (2 mg total) by mouth at bedtime. 30 capsule 1  . testosterone (ANDROGEL) 50 MG/5GM (1%) GEL Place 5 g onto the skin daily. 450 g 1  . traZODone (DESYREL) 50 MG tablet Take 1 tablet (50 mg total) by mouth at bedtime. 30 tablet 1    . vardenafil (LEVITRA) 20 MG tablet Take 1 tablet (20 mg total) by mouth daily as needed for erectile dysfunction. 6 tablet 5   No current facility-administered medications on file prior to visit.    cReview of Systems  Constitutional: Negative for unusual diaphoresis or night sweats HENT: Negative for ear swelling or discharge Eyes: Negative for worsening visual haziness  Respiratory: Negative for choking and stridor.   Gastrointestinal: Negative for distension or worsening eructation Genitourinary: Negative for retention or change in urine volume.  Musculoskeletal: Negative for other MSK pain or swelling Skin: Negative for color change and worsening wound Neurological: Negative for tremors and numbness other than noted  Psychiatric/Behavioral: Negative for decreased concentration or agitation other than above   All other system neg per pt    Objective:   Physical Exam BP 106/86   Pulse 86   Temp 97.6 F (36.4 C)   Ht 6\' 2"  (1.88 m)   Wt (!) 324 lb (147 kg)   SpO2 99%   BMI 41.60 kg/m  VS noted,  Constitutional: Pt appears in no apparent distress HENT: Head: NCAT.  Right Ear: External ear normal.  Left Ear: External ear normal.  Eyes: . Pupils are equal, round, and reactive to light. Conjunctivae and EOM are normal Neck: Normal range of motion. Neck supple.  Cardiovascular: Normal rate and regular rhythm.   Pulmonary/Chest: Effort normal and breath sounds without rales or wheezing.  Abd:  Soft, NT, ND, + BS Spine: nontender; but has left lumbar paravertebral spasm/tender without swelling or rash Neurological: Pt is alert. Not confused , motor grossly intact, sens /DTR symmeteric and intact Skin: Skin is warm. No rash, no LE edema Psychiatric: Pt behavior is normal. No agitation. 1-2+ nervous, depressed affect No other new exam findings    Assessment & Plan:

## 2016-09-08 NOTE — Assessment & Plan Note (Signed)
C/w msk strain, to cont mobic, add flexeril prn, offered 3 days off work but he declines

## 2016-09-08 NOTE — Assessment & Plan Note (Addendum)
stable overall by history and exam, recent data reviewed with pt, and pt to continue medical treatment as before,  to f/u any worsening symptoms or concerns Lab Results  Component Value Date   HGBA1C 5.8 01/09/2016   Declines lab as followed at [facility]

## 2016-09-08 NOTE — Assessment & Plan Note (Signed)
Mod to severe and persistent, [facility], denies SI or HI, cont same tx for now, he inquires how to go about seeking psychiatric disability application

## 2016-09-08 NOTE — Assessment & Plan Note (Signed)
stable overall by history and exam, recent data reviewed with pt, and pt to continue medical treatment as before,  to f/u any worsening symptoms or concerns BP Readings from Last 3 Encounters:  09/07/16 106/86  04/15/16 122/80  01/08/16 120/72

## 2016-11-06 ENCOUNTER — Other Ambulatory Visit: Payer: Self-pay | Admitting: Internal Medicine

## 2017-01-13 LAB — HM DIABETES EYE EXAM

## 2017-02-06 ENCOUNTER — Other Ambulatory Visit: Payer: Self-pay | Admitting: Internal Medicine

## 2017-02-20 ENCOUNTER — Other Ambulatory Visit: Payer: Self-pay | Admitting: Internal Medicine

## 2017-02-21 IMAGING — US US SCROTUM
1 series · 14 of 25 positions shown · non-contrast
Comparison: None.

CLINICAL DATA: Worsening left scrotal mass.

EXAM:
SCROTAL ULTRASOUND
DOPPLER ULTRASOUND OF THE TESTICLES
TECHNIQUE: Complete ultrasound examination of the testicles, epididymis, and
other scrotal structures was performed. Color and spectral Doppler
ultrasound were also utilized to evaluate blood flow to the
testicles.

[Series 1: us scrotum · 14 of 93 slices shown]
[im 1/93]
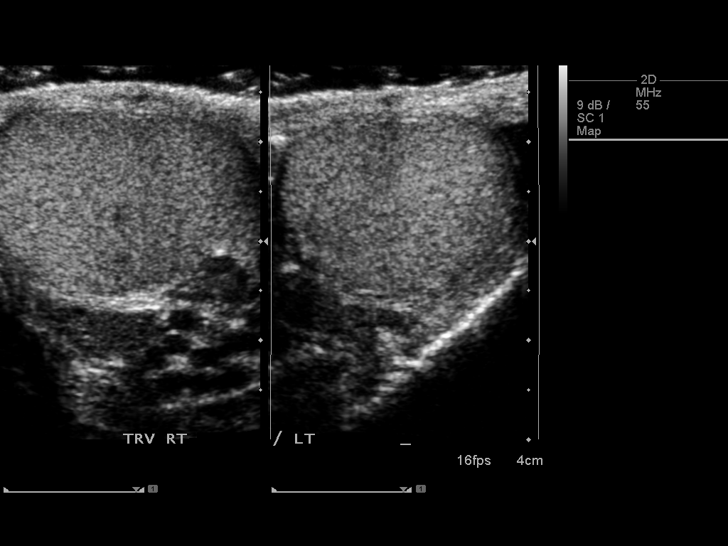
[im 8/93]
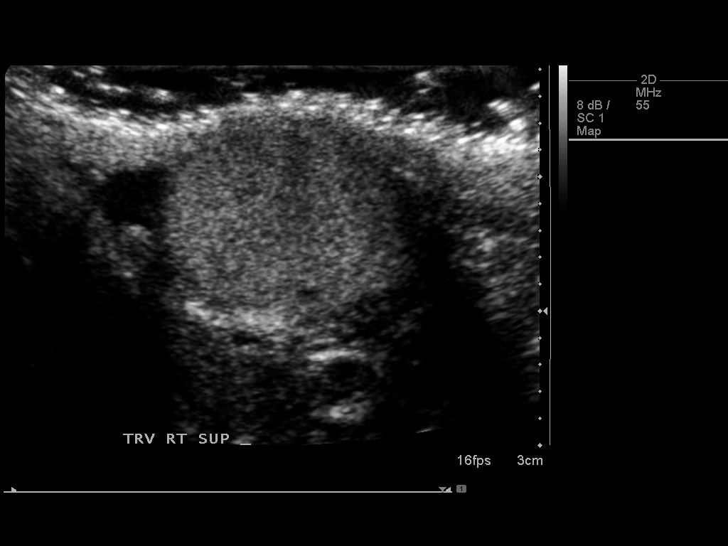
[im 16/93]
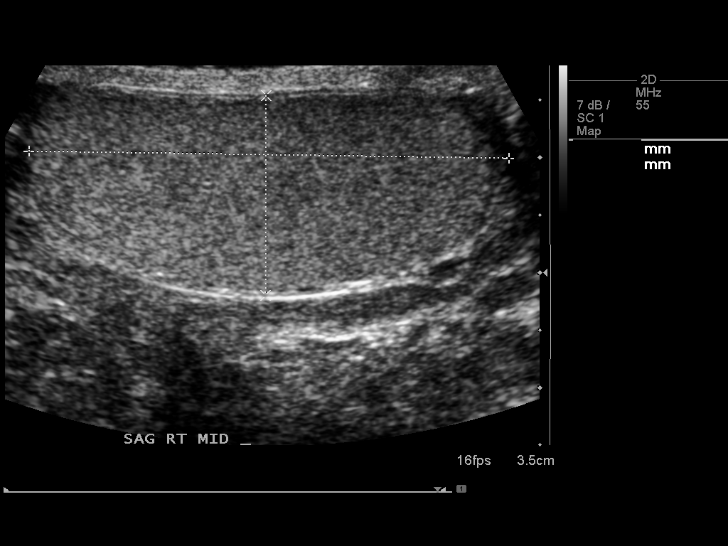
[im 24/93]
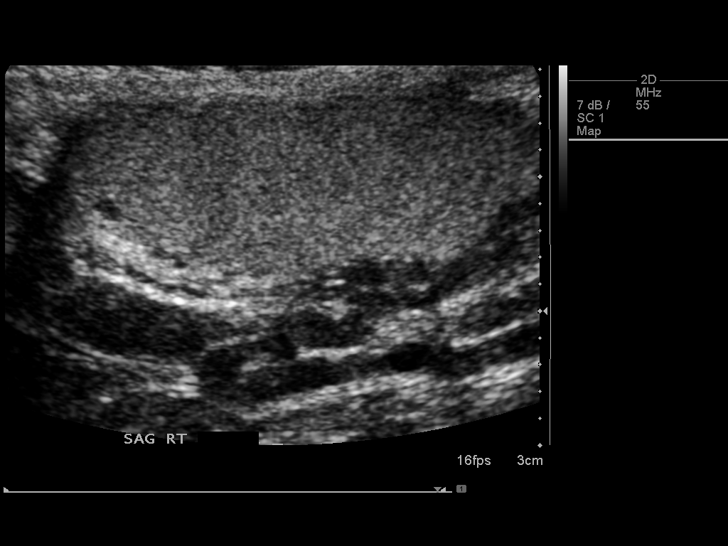
[im 31/93]
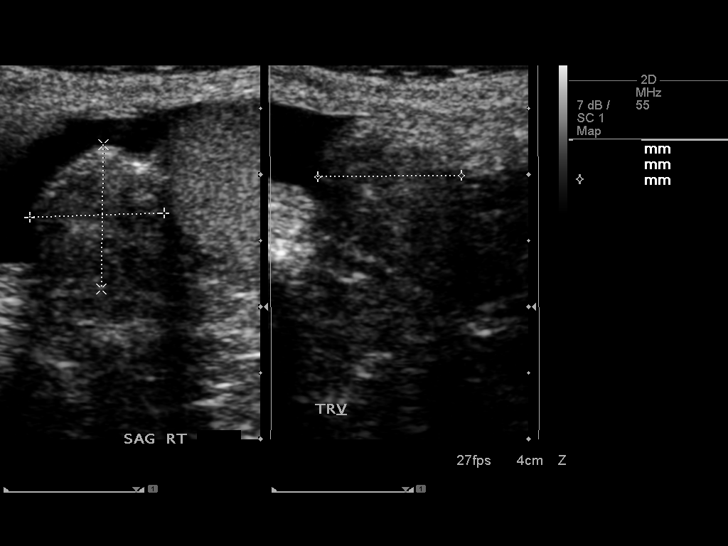
[im 35/93]
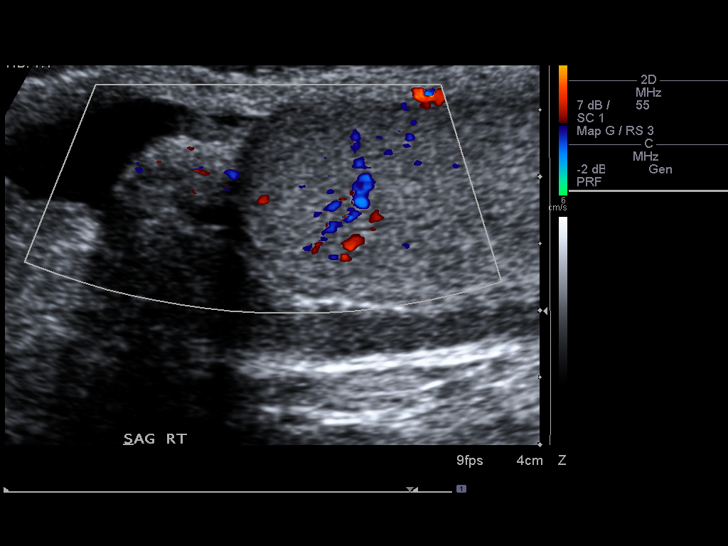
[im 43/93]
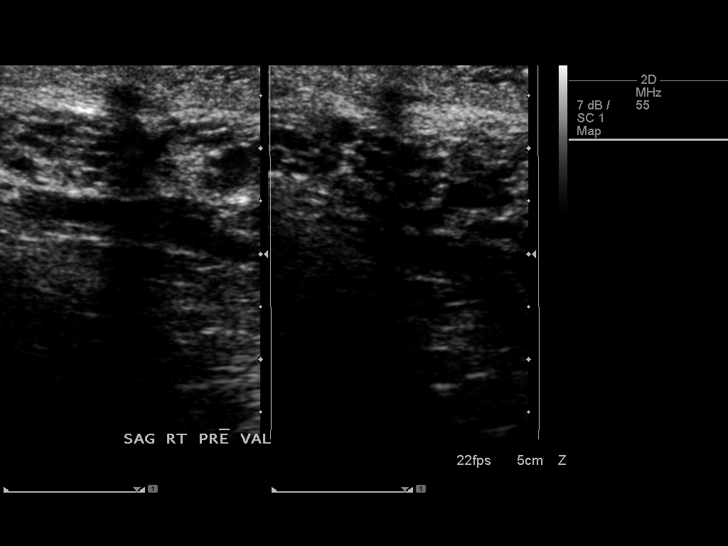
[im 50/93]
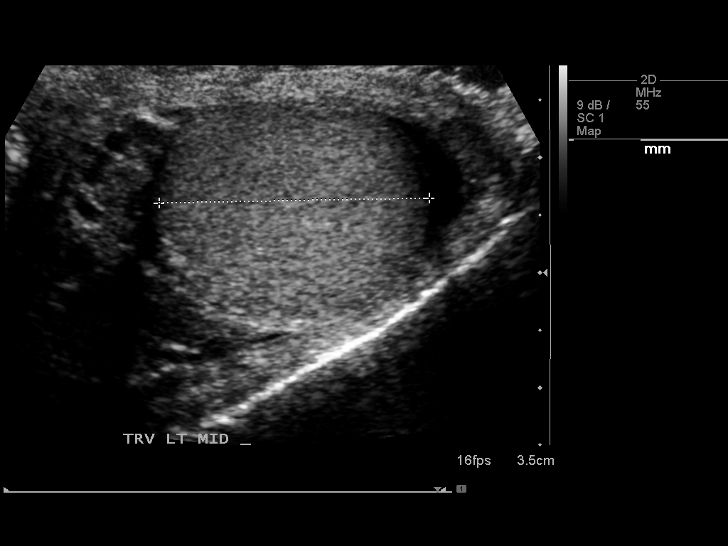
[im 58/93]
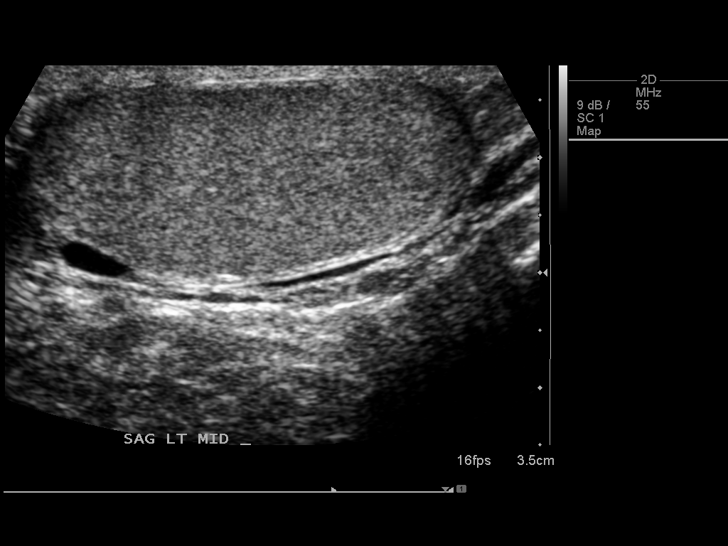
[im 62/93]
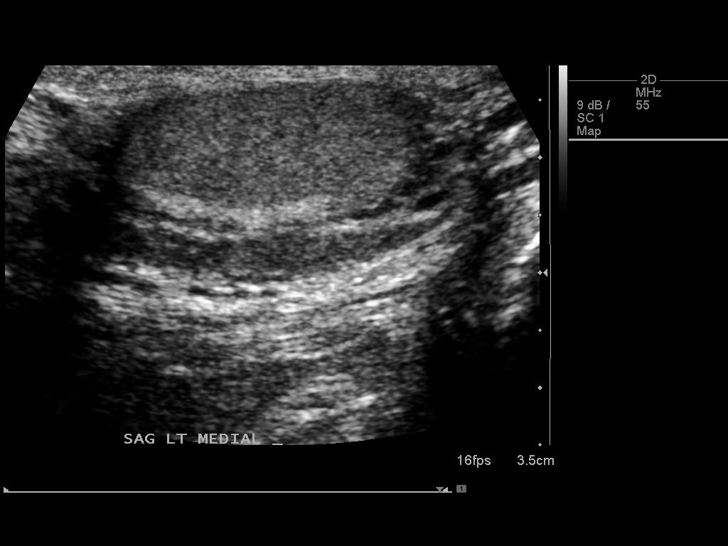
[im 70/93]
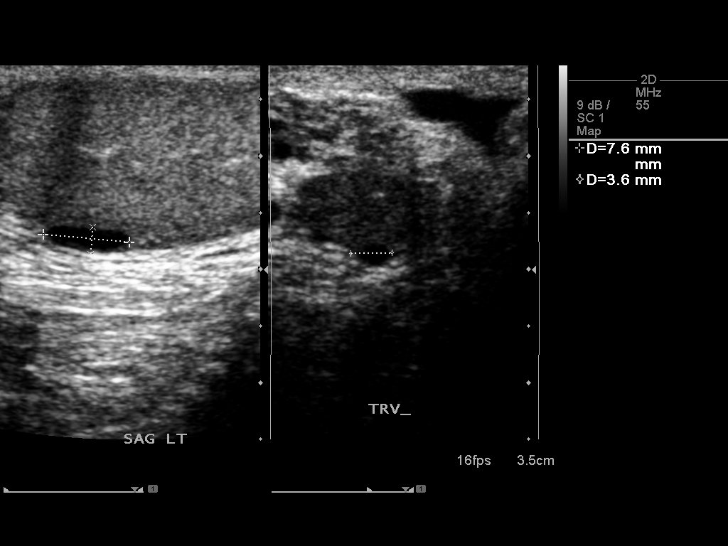
[im 77/93]
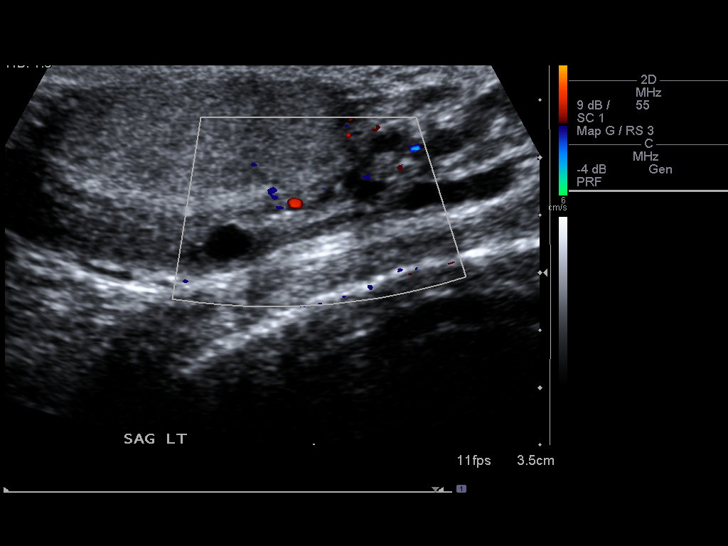
[im 85/93]
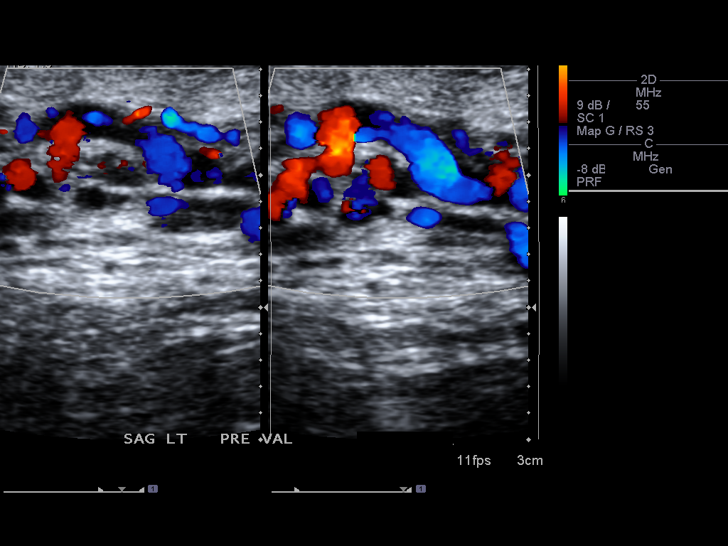
[im 93/93]
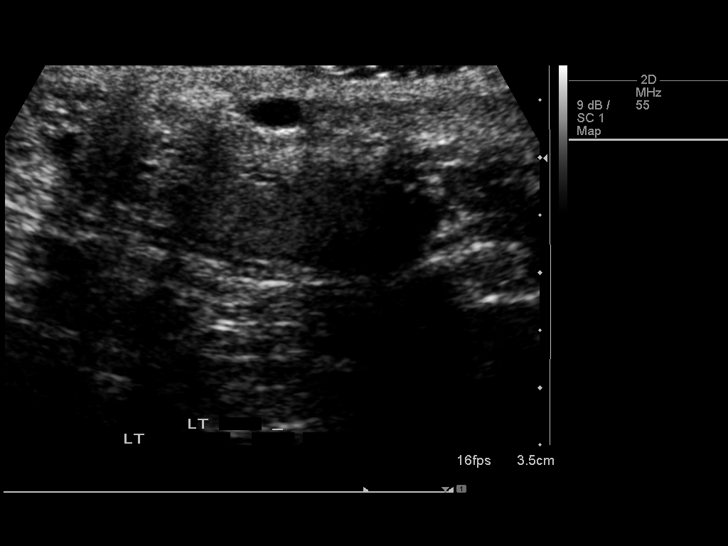

[14 of 25 positions shown; findings below may reference images not displayed]

FINDINGS: Right testicle

Measurements: 4.2 x 1.7 x 2.5 cm. Possible cyst of the tunica at 2
mm..

Left testicle

Measurements:  3.9 x 1.8 x 2.4 cm.  Normal in morphology.

Right epididymis:  Normal in size and appearance.

Left epididymis: Epididymal cysts versus spermatoceles. The largest
measures 1.0 cm. This likely corresponds to the palpable
abnormality.

Hydrocele:  None visualized.

Varicocele:  None visualized.

Pulsed Doppler interrogation of both testes demonstrates normal low
resistance arterial and venous waveforms bilaterally.
IMPRESSION: 1. No suspicious testicular mass identified.
2. Left epididymal cysts versus spermatoceles. These likely account
for the palpable abnormality.
3. No evidence of testicular torsion.

## 2017-03-07 ENCOUNTER — Other Ambulatory Visit: Payer: Self-pay | Admitting: Internal Medicine

## 2017-03-15 ENCOUNTER — Encounter: Payer: Self-pay | Admitting: Internal Medicine

## 2017-03-15 ENCOUNTER — Ambulatory Visit (INDEPENDENT_AMBULATORY_CARE_PROVIDER_SITE_OTHER): Payer: BLUE CROSS/BLUE SHIELD | Admitting: Internal Medicine

## 2017-03-15 ENCOUNTER — Other Ambulatory Visit (INDEPENDENT_AMBULATORY_CARE_PROVIDER_SITE_OTHER): Payer: BLUE CROSS/BLUE SHIELD

## 2017-03-15 VITALS — BP 104/72 | HR 75 | Temp 98.1°F | Ht 74.0 in | Wt 326.0 lb

## 2017-03-15 DIAGNOSIS — I10 Essential (primary) hypertension: Secondary | ICD-10-CM

## 2017-03-15 DIAGNOSIS — G8929 Other chronic pain: Secondary | ICD-10-CM | POA: Diagnosis not present

## 2017-03-15 DIAGNOSIS — Z0001 Encounter for general adult medical examination with abnormal findings: Secondary | ICD-10-CM

## 2017-03-15 DIAGNOSIS — F329 Major depressive disorder, single episode, unspecified: Secondary | ICD-10-CM

## 2017-03-15 DIAGNOSIS — E119 Type 2 diabetes mellitus without complications: Secondary | ICD-10-CM

## 2017-03-15 DIAGNOSIS — F32A Depression, unspecified: Secondary | ICD-10-CM

## 2017-03-15 DIAGNOSIS — Z23 Encounter for immunization: Secondary | ICD-10-CM

## 2017-03-15 DIAGNOSIS — E291 Testicular hypofunction: Secondary | ICD-10-CM | POA: Diagnosis not present

## 2017-03-15 DIAGNOSIS — E785 Hyperlipidemia, unspecified: Secondary | ICD-10-CM

## 2017-03-15 LAB — URINALYSIS, ROUTINE W REFLEX MICROSCOPIC
Bilirubin Urine: NEGATIVE
HGB URINE DIPSTICK: NEGATIVE
Ketones, ur: NEGATIVE
LEUKOCYTES UA: NEGATIVE
NITRITE: NEGATIVE
PH: 6 (ref 5.0–8.0)
RBC / HPF: NONE SEEN (ref 0–?)
Specific Gravity, Urine: 1.02 (ref 1.000–1.030)
Total Protein, Urine: NEGATIVE
URINE GLUCOSE: NEGATIVE
Urobilinogen, UA: 0.2 (ref 0.0–1.0)

## 2017-03-15 LAB — HEPATIC FUNCTION PANEL
ALBUMIN: 4.5 g/dL (ref 3.5–5.2)
ALT: 15 U/L (ref 0–53)
AST: 14 U/L (ref 0–37)
Alkaline Phosphatase: 53 U/L (ref 39–117)
Bilirubin, Direct: 0.1 mg/dL (ref 0.0–0.3)
TOTAL PROTEIN: 7.3 g/dL (ref 6.0–8.3)
Total Bilirubin: 0.6 mg/dL (ref 0.2–1.2)

## 2017-03-15 LAB — CBC WITH DIFFERENTIAL/PLATELET
Basophils Absolute: 0 10*3/uL (ref 0.0–0.1)
Basophils Relative: 0.6 % (ref 0.0–3.0)
EOS ABS: 0 10*3/uL (ref 0.0–0.7)
Eosinophils Relative: 0.7 % (ref 0.0–5.0)
HCT: 44.1 % (ref 39.0–52.0)
HEMOGLOBIN: 13.9 g/dL (ref 13.0–17.0)
Lymphocytes Relative: 30.6 % (ref 12.0–46.0)
Lymphs Abs: 1.8 10*3/uL (ref 0.7–4.0)
MCHC: 31.4 g/dL (ref 30.0–36.0)
MCV: 84.1 fl (ref 78.0–100.0)
MONO ABS: 0.5 10*3/uL (ref 0.1–1.0)
Monocytes Relative: 9.1 % (ref 3.0–12.0)
Neutro Abs: 3.5 10*3/uL (ref 1.4–7.7)
Neutrophils Relative %: 59 % (ref 43.0–77.0)
Platelets: 208 10*3/uL (ref 150.0–400.0)
RBC: 5.24 Mil/uL (ref 4.22–5.81)
RDW: 14 % (ref 11.5–15.5)
WBC: 5.9 10*3/uL (ref 4.0–10.5)

## 2017-03-15 LAB — BASIC METABOLIC PANEL
BUN: 15 mg/dL (ref 6–23)
CHLORIDE: 104 meq/L (ref 96–112)
CO2: 30 mEq/L (ref 19–32)
Calcium: 9.8 mg/dL (ref 8.4–10.5)
Creatinine, Ser: 0.87 mg/dL (ref 0.40–1.50)
GFR: 118.37 mL/min (ref >60.00)
Glucose, Bld: 99 mg/dL (ref 70–99)
POTASSIUM: 4.5 meq/L (ref 3.5–5.1)
Sodium: 140 mEq/L (ref 135–145)

## 2017-03-15 LAB — HEMOGLOBIN A1C: Hgb A1c MFr Bld: 5.7 % (ref 4.6–6.5)

## 2017-03-15 LAB — LIPID PANEL
CHOL/HDL RATIO: 4
Cholesterol: 162 mg/dL (ref 0–200)
HDL: 45.9 mg/dL (ref >39.00)
LDL CALC: 101 mg/dL — AB (ref 0–99)
NONHDL: 115.63
TRIGLYCERIDES: 72 mg/dL (ref 0.0–149.0)
VLDL: 14.4 mg/dL (ref 0.0–40.0)

## 2017-03-15 LAB — PSA: PSA: 0.28 ng/mL (ref 0.10–4.00)

## 2017-03-15 LAB — TESTOSTERONE: Testosterone: 238.01 ng/dL — ABNORMAL LOW (ref 300.00–890.00)

## 2017-03-15 LAB — MICROALBUMIN / CREATININE URINE RATIO
CREATININE, U: 152.8 mg/dL
Microalb Creat Ratio: 0.5 mg/g (ref 0.0–30.0)
Microalb, Ur: <0.7 mg/dL (ref 0.0–1.9)

## 2017-03-15 LAB — TSH: TSH: 0.91 u[IU]/mL (ref 0.35–4.50)

## 2017-03-15 MED ORDER — METFORMIN HCL 500 MG PO TABS: 1000.0000 mg | ORAL_TABLET | Freq: Two times a day (BID) | ORAL | 3 refills | Status: DC

## 2017-03-15 MED ORDER — OMEPRAZOLE 40 MG PO CPDR: 40.0000 mg | DELAYED_RELEASE_CAPSULE | Freq: Every day | ORAL | 3 refills | Status: DC

## 2017-03-15 NOTE — Assessment & Plan Note (Signed)
Aysmpt, no low sugar symptoms, but recent a1c have been good and with overall 40 lb wt loss we may be able to decrease the metformin, for f/u lab today

## 2017-03-15 NOTE — Assessment & Plan Note (Signed)
Unclear if assoc with his depression, [facility], [facility] lab

## 2017-03-15 NOTE — Assessment & Plan Note (Signed)
Was considered for statin per cards at [facility] per pt, but felt not needed at this time with recent neg cath

## 2017-03-15 NOTE — Assessment & Plan Note (Signed)
stable overall by history and exam, recent data reviewed with pt, and pt to continue medical treatment as before,  to f/u any worsening symptoms or concerns  

## 2017-03-15 NOTE — Assessment & Plan Note (Addendum)
A large part of his psychiatric symptoms I suspect, hold on further meds at this time, declines pain clinic for now, I suspect if he consents for the recommended left knee TKR that his overall pain and mobility will be improved  In addition to the time spent performing CPE, I spent an additional 25 minutes face to face,in which greater than 50% of this time was spent in counseling and coordination of care for patient's acute illness as documented., including the differential dx, tx, further evaluation and other management of chronic pain, depression, DM, HTN, HLD and hypogonadism

## 2017-03-15 NOTE — Patient Instructions (Signed)

## 2017-03-15 NOTE — Assessment & Plan Note (Signed)

## 2017-03-15 NOTE — Progress Notes (Unsigned)
Left message on MyChart, pt to cont same tx

## 2017-03-15 NOTE — Progress Notes (Signed)
Subjective:    Patient ID: [name], male    DOB: 1965/10/09, 51 y.o.   MRN: [id number]  HPI  Here for wellness and f/u;  Overall doing ok;  Pt denies Chest pain, worsening SOB, DOE, wheezing, orthopnea, PND, worsening LE edema, palpitations, dizziness or syncope.  Pt denies neurological change such as new headache, facial or extremity weakness.  Pt denies polydipsia, polyuria, or low sugar symptoms. Pt states overall good compliance with treatment and medications, good tolerability, and has been trying to follow appropriate diet.  Pt denies worsening depressive symptoms, suicidal ideation or panic. No fever, night sweats, wt loss, loss of appetite, or other constitutional symptoms.  Pt states good ability with ADL's, has low fall risk, home safety reviewed and adequate, no other significant changes in hearing or vision, and only occasionally active with exercise.  S/p right kneeTKR at 51yo, may need left knee done soon after several injections recently, s/p arthroscopy to left knee as well about 51yo.  Has mostly sitting job at [employer], Has FMLA to limit him to 8 hrs pe rday for 5 days, with 6 days as he prefers, compared to some working 11 hr days at up to 7 days per wk.  Left knee did give out once but caught himself. No falls.   Did also have cardiac cath about April 2018 right radial approch NEG for cad (even nonobstructive per pt).  No WMA's per pt, Recent CP thought due to Reflux only.   Pt denies polydipsia, polyuria, and peak wt has been 368, now down over 40 lbs.   Wt Readings from Last 3 Encounters:  03/15/17 (!) 326 lb (147.9 kg)  09/07/16 (!) 324 lb (147 kg)  04/15/16 (!) 319 lb (144.7 kg)  had labs done about feb 2018 at [name] with a1c 4.8.  Pt declines statin as recent cath neg. Needs testosterone checked.  Thinking about applying for SSI due to overall arthritis, tramadol not working well, and trying to not take the vicodin due to dependence risk.  Has volt gel prn.   Sees psychiatry  every 2 wks, has had thoughts of harming himself in the past, but not currently.  Past Medical History:  Diagnosis Date  . Arthritis   . Depression 07/08/2015  . Diabetes mellitus ([place])   . Diabetes mellitus, type II ([place])   . DJD (degenerative joint disease)   . Gallstones   . GERD (gastroesophageal reflux disease)   . Gout   . HTN (hypertension)   . Hyperglycemia   . Hyperlipidemia   . Male hypogonadism 07/08/2015  . Obesity   . PONV (postoperative nausea and vomiting)    Past Surgical History:  Procedure Laterality Date  . Back Cyst Excision     x2  . CHOLECYSTECTOMY  08/11/2011   Procedure: LAPAROSCOPIC CHOLECYSTECTOMY WITH INTRAOPERATIVE CHOLANGIOGRAM;  Surgeon: Judieth Keens, DO;  Location: [facility];  Service: General;  Laterality: N/A;  Laparoscopic cholecystectomy with cholangiogram.  . COLONOSCOPY WITH PROPOFOL N/A 09/15/2015   Procedure: COLONOSCOPY WITH PROPOFOL;  Surgeon: Gatha Mayer, MD;  Location[facility];  Service: Endoscopy;  Laterality: N/A;  . JOINT REPLACEMENT  01/26/2011   right knee  . KNEE ARTHROSCOPY     x 2 left  . KNEE ARTHROSCOPY     x 2 right  . REPAIR KNEE LIGAMENT     Left  . RIGHT FOOT     BONES STRAIGHTENED OUT   2006  . Shoulder Surgery, Reconstruction of Joint  Left x 3, Last 02/2007  . Tennis elbow surgery, Nerve entrapment     x2 right    reports that he has never smoked. He has never used smokeless tobacco. He reports that he does not drink alcohol or use drugs. family history includes Colon cancer (age of onset: 62) in his brother; Ovarian cancer in his maternal grandmother; Sarcoidosis in his mother. No Known Allergies  Current Outpatient Prescriptions on File Prior to Visit  Medication Sig Dispense Refill  . cromolyn (OPTICROM) 4 % ophthalmic solution Place 1 drop into both eyes 2 (two) times daily.    . cyclobenzaprine (FLEXERIL) 5 MG tablet Take 1 tablet (5 mg total) by mouth 3 (three) times daily as needed for muscle  spasms. 40 tablet 1  . HYDROcodone-acetaminophen (NORCO/VICODIN) 5-325 MG tablet Take 1 tablet by mouth every 6 (six) hours as needed for moderate pain. 40 tablet 0  . indomethacin (INDOCIN) 50 MG capsule TAKE ONE CAPSULE EVERY 6 HOURS AS NEEDED GOUT ONLY 60 capsule 2  . meloxicam (MOBIC) 15 MG tablet TAKE 1 TABLET (15 MG TOTAL) BY MOUTH DAILY. 30 tablet 5  . metoprolol tartrate (LOPRESSOR) 25 MG tablet     . olmesartan (BENICAR) 20 MG tablet TAKE 1 TABLET (20 MG TOTAL) BY MOUTH DAILY. 90 tablet 2  . prazosin (MINIPRESS) 2 MG capsule Take 1 capsule (2 mg total) by mouth at bedtime. 30 capsule 1  . testosterone (ANDROGEL) 50 MG/5GM (1%) GEL Place 5 g onto the skin daily. 450 g 1  . traZODone (DESYREL) 50 MG tablet Take 1 tablet (50 mg total) by mouth at bedtime. 30 tablet 1  . vardenafil (LEVITRA) 20 MG tablet Take 1 tablet (20 mg total) by mouth daily as needed for erectile dysfunction. 6 tablet 5  . escitalopram (LEXAPRO) 20 MG tablet Take 1 tablet (20 mg total) by mouth daily. 30 tablet 2   No current facility-administered medications on file prior to visit.    Review of Systems  Constitutional: Negative for other unusual diaphoresis, sweats, appetite or weight changes HENT: Negative for other worsening hearing loss, ear pain, facial swelling, mouth sores or neck stiffness.   Eyes: Negative for other worsening pain, redness or other visual disturbance.  Respiratory: Negative for other stridor or swelling Cardiovascular: Negative for other palpitations or other chest pain  Gastrointestinal: Negative for worsening diarrhea or loose stools, blood in stool, distention or other pain Genitourinary: Negative for hematuria, flank pain or other change in urine volume.  Musculoskeletal: Negative for myalgias or other joint swelling.  Skin: Negative for other color change, or other wound or worsening drainage.  Neurological: Negative for other syncope or numbness. Hematological: Negative for other  adenopathy or swelling Psychiatric/Behavioral: Negative for hallucinations, other worsening agitation, SI, self-injury, or new decreased concentration All other system neg per pt     Objective:   Physical Exam BP 104/72   Pulse 75   Temp 98.1 F (36.7 C) (Oral)   Ht 6\' 2"  (1.88 m)   Wt (!) 326 lb (147.9 kg)   SpO2 98%   BMI 41.86 kg/m  VS noted,  Constitutional: Pt is oriented to person, place, and time. Appears well-developed and well-nourished, in no significant distress and comfortable Head: Normocephalic and atraumatic  Eyes: Conjunctivae and EOM are normal. Pupils are equal, round, and reactive to light Right Ear: External ear normal without discharge Left Ear: External ear normal without discharge Nose: Nose without discharge or deformity Mouth/Throat: Oropharynx is without other ulcerations  and moist  Neck: Normal range of motion. Neck supple. No JVD present. No tracheal deviation present or significant neck LA or mass Cardiovascular: Normal rate, regular rhythm, normal heart sounds and intact distal pulses.   Pulmonary/Chest: WOB normal and breath sounds without rales or wheezing  Abdominal: Soft. Bowel sounds are normal. NT. No HSM  Musculoskeletal: Normal range of motion. Exhibits no edema, s/p right knee TKR, left knee with marked crepitus, swelling but NT, no heat Lymphadenopathy: Has no other cervical adenopathy.  Neurological: Pt is alert and oriented to person, place, and time. Pt has normal reflexes. No cranial nerve deficit. Motor grossly intact, Gait intact Skin: Skin is warm and dry. No rash noted or new ulcerations Psychiatric:  Has normal mood and affect. Behavior is normal without agitation No other exam findings Lab Results  Component Value Date   WBC 6.7 07/08/2015   HGB 13.7 07/08/2015   HCT 43.3 07/08/2015   PLT 222.0 07/08/2015   GLUCOSE 99 01/09/2016   CHOL 157 01/09/2016   TRIG 42.0 01/09/2016   HDL 40.60 01/09/2016   LDLCALC 108 (H) 01/09/2016    ALT 18 01/09/2016   AST 14 01/09/2016   NA 137 01/09/2016   K 4.2 01/09/2016   CL 104 01/09/2016   CREATININE 0.84 01/09/2016   BUN 11 01/09/2016   CO2 28 01/09/2016   TSH 1.26 07/08/2015   PSA 0.22 07/08/2015   INR 0.96 05/14/2011   HGBA1C 5.8 01/09/2016   MICROALBUR 0.7 01/09/2016       Assessment & Plan:

## 2017-03-15 NOTE — Assessment & Plan Note (Signed)
No current SI or HI, to cont psychiatric f/u as he does, pt may consider applying for SSI based on psychiatric

## 2017-06-05 ENCOUNTER — Other Ambulatory Visit: Payer: Self-pay | Admitting: Internal Medicine

## 2017-07-08 DIAGNOSIS — K116 Mucocele of salivary gland: Secondary | ICD-10-CM | POA: Insufficient documentation

## 2017-07-10 ENCOUNTER — Other Ambulatory Visit: Payer: Self-pay | Admitting: Internal Medicine

## 2017-09-16 ENCOUNTER — Other Ambulatory Visit (INDEPENDENT_AMBULATORY_CARE_PROVIDER_SITE_OTHER): Payer: BLUE CROSS/BLUE SHIELD

## 2017-09-16 ENCOUNTER — Encounter: Payer: Self-pay | Admitting: Internal Medicine

## 2017-09-16 ENCOUNTER — Ambulatory Visit: Payer: BLUE CROSS/BLUE SHIELD | Admitting: Internal Medicine

## 2017-09-16 VITALS — BP 112/76 | HR 78 | Temp 97.9°F | Ht 74.0 in | Wt 334.0 lb

## 2017-09-16 DIAGNOSIS — Z0000 Encounter for general adult medical examination without abnormal findings: Secondary | ICD-10-CM | POA: Diagnosis not present

## 2017-09-16 DIAGNOSIS — G4733 Obstructive sleep apnea (adult) (pediatric): Secondary | ICD-10-CM | POA: Diagnosis not present

## 2017-09-16 DIAGNOSIS — I10 Essential (primary) hypertension: Secondary | ICD-10-CM | POA: Diagnosis not present

## 2017-09-16 DIAGNOSIS — Z9989 Dependence on other enabling machines and devices: Secondary | ICD-10-CM

## 2017-09-16 DIAGNOSIS — E119 Type 2 diabetes mellitus without complications: Secondary | ICD-10-CM

## 2017-09-16 DIAGNOSIS — E785 Hyperlipidemia, unspecified: Secondary | ICD-10-CM | POA: Diagnosis not present

## 2017-09-16 DIAGNOSIS — E291 Testicular hypofunction: Secondary | ICD-10-CM | POA: Diagnosis not present

## 2017-09-16 LAB — HEPATIC FUNCTION PANEL
ALBUMIN: 4.3 g/dL (ref 3.5–5.2)
ALT: 21 U/L (ref 0–53)
AST: 14 U/L (ref 0–37)
Alkaline Phosphatase: 46 U/L (ref 39–117)
Bilirubin, Direct: 0.2 mg/dL (ref 0.0–0.3)
Total Bilirubin: 0.6 mg/dL (ref 0.2–1.2)
Total Protein: 7.7 g/dL (ref 6.0–8.3)

## 2017-09-16 LAB — TESTOSTERONE: Testosterone: 243.76 ng/dL — ABNORMAL LOW (ref 300.00–890.00)

## 2017-09-16 LAB — LIPID PANEL
CHOL/HDL RATIO: 4
Cholesterol: 157 mg/dL (ref 0–200)
HDL: 43.2 mg/dL (ref >39.00)
LDL CALC: 106 mg/dL — AB (ref 0–99)
NonHDL: 114.15
TRIGLYCERIDES: 42 mg/dL (ref 0.0–149.0)
VLDL: 8.4 mg/dL (ref 0.0–40.0)

## 2017-09-16 LAB — BASIC METABOLIC PANEL
BUN: 17 mg/dL (ref 6–23)
CHLORIDE: 105 meq/L (ref 96–112)
CO2: 28 meq/L (ref 19–32)
Calcium: 9.9 mg/dL (ref 8.4–10.5)
Creatinine, Ser: 0.87 mg/dL (ref 0.40–1.50)
GFR: 118.14 mL/min (ref >60.00)
Glucose, Bld: 103 mg/dL — ABNORMAL HIGH (ref 70–99)
Potassium: 4.2 mEq/L (ref 3.5–5.1)
SODIUM: 140 meq/L (ref 135–145)

## 2017-09-16 LAB — HEMOGLOBIN A1C: Hgb A1c MFr Bld: 5.9 % (ref 4.6–6.5)

## 2017-09-16 MED ORDER — DICLOFENAC SODIUM 1 % TD GEL: 4.0000 g | Freq: Four times a day (QID) | TRANSDERMAL | 3 refills | Status: DC | PRN

## 2017-09-16 MED ORDER — MELOXICAM 15 MG PO TABS: ORAL_TABLET | ORAL | 3 refills | Status: DC

## 2017-09-16 MED ORDER — CANAGLIFLOZIN 100 MG PO TABS: 100.0000 mg | ORAL_TABLET | Freq: Every day | ORAL | 3 refills | Status: DC

## 2017-09-16 NOTE — Patient Instructions (Addendum)
OK to stop the metformin   Please take all new medication as prescribed - the Invokana 100 mg, and the Voltaren gel topical for pain  Please cont to check your blood sugars as you do, and call in 1-2 weeks if your sugars are over 150-200 fairly often  Please have the pharmacy call us with the name of a medication in that family if the invokana is not covered with your insurance  You will be contacted regarding the referral for: Dr Halford Chessman  Please continue all other medications as before, and refills have been done if requested - the mobic  Please have the pharmacy call with any other refills you may need.  Please continue your efforts at being more active, low cholesterol diabetic diet, and weight control  Please keep your appointments with your specialists as you may have planned  Please go to the LAB in the Basement (turn left off the elevator) for the tests to be done today  You will be contacted by phone if any changes need to be made immediately.  Otherwise, you will receive a letter about your results with an explanation, but please check with MyChart first.  Please remember to sign up for MyChart if you have not done so, as this will be important to you in the future with finding out test results, communicating by private email, and scheduling acute appointments online when needed.  Please return in 6 months, or sooner if needed, with Lab testing done 3-5 days before

## 2017-09-16 NOTE — Progress Notes (Signed)
Subjective:    Patient ID: [name], male    DOB: 1966/03/13, 52 y.o.   MRN: [id number]  HPI  Here to f/u; overall doing ok,  Pt denies chest pain, increasing sob or doe, wheezing, orthopnea, PND, increased LE swelling, palpitations, dizziness or syncope.  Pt denies new neurological symptoms such as new headache, or facial or extremity weakness or numbness.  Pt denies polydipsia, polyuria, or low sugar episode.  Pt states overall good compliance with meds, mostly trying to follow appropriate diet, with wt overall stable,  but little exercise however. Has some bowel freq likely due to metfomin at 1 x 500 mg bid.  Also has recent new dx OSA now on CPAP (good compliance) from [name], asks for local referral for tx.   Now 90% service connected disabled with PTSD, anxiety, depression .  Also had chronic knee pain s/p left knee surgury x 5 and s/p TKR right Also taking "testosterone booster" supplements, asks for f/u hormone level.  No other new complaints or interval hx Past Medical History:  Diagnosis Date  . Arthritis   . Depression 07/08/2015  . Diabetes mellitus ([place])   . Diabetes mellitus, type II ([place])   . DJD (degenerative joint disease)   . Gallstones   . GERD (gastroesophageal reflux disease)   . Gout   . HTN (hypertension)   . Hyperglycemia   . Hyperlipidemia   . Male hypogonadism 07/08/2015  . Obesity   . PONV (postoperative nausea and vomiting)    Past Surgical History:  Procedure Laterality Date  . Back Cyst Excision     x2  . CHOLECYSTECTOMY  08/11/2011   Procedure: LAPAROSCOPIC CHOLECYSTECTOMY WITH INTRAOPERATIVE CHOLANGIOGRAM;  Surgeon: Judieth Keens, DO;  Location: [facility];  Service: General;  Laterality: N/A;  Laparoscopic cholecystectomy with cholangiogram.  . COLONOSCOPY WITH PROPOFOL N/A 09/15/2015   Procedure: COLONOSCOPY WITH PROPOFOL;  Surgeon: Gatha Mayer, MD;  [facility];  Service: Endoscopy;  Laterality: N/A;  . JOINT REPLACEMENT  01/26/2011   right knee  . KNEE ARTHROSCOPY     x 2 left  . KNEE ARTHROSCOPY     x 2 right  . REPAIR KNEE LIGAMENT     Left  . RIGHT FOOT     BONES STRAIGHTENED OUT   2006  . Shoulder Surgery, Reconstruction of Joint     Left x 3, Last 02/2007  . Tennis elbow surgery, Nerve entrapment     x2 right    reports that  has never smoked. he has never used smokeless tobacco. He reports that he does not drink alcohol or use drugs. family history includes Colon cancer (age of onset: 29) in his brother; Ovarian cancer in his maternal grandmother; Sarcoidosis in his mother. No Known Allergies Current Outpatient Medications on File Prior to Visit  Medication Sig Dispense Refill  . cromolyn (OPTICROM) 4 % ophthalmic solution Place 1 drop into both eyes 2 (two) times daily.    . cyclobenzaprine (FLEXERIL) 5 MG tablet Take 1 tablet (5 mg total) by mouth 3 (three) times daily as needed for muscle spasms. 40 tablet 1  . HYDROcodone-acetaminophen (NORCO/VICODIN) 5-325 MG tablet Take 1 tablet by mouth every 6 (six) hours as needed for moderate pain. 40 tablet 0  . indomethacin (INDOCIN) 50 MG capsule TAKE ONE CAPSULE EVERY 6 HOURS AS NEEDED GOUT ONLY 60 capsule 2  . metoprolol tartrate (LOPRESSOR) 25 MG tablet     . olmesartan (BENICAR) 20 MG tablet TAKE 1  TABLET (20 MG TOTAL) BY MOUTH DAILY. 90 tablet 3  . omeprazole (PRILOSEC) 40 MG capsule Take 1 capsule (40 mg total) by mouth daily. 90 capsule 3  . prazosin (MINIPRESS) 2 MG capsule Take 1 capsule (2 mg total) by mouth at bedtime. 30 capsule 1  . testosterone (ANDROGEL) 50 MG/5GM (1%) GEL Place 5 g onto the skin daily. 450 g 1  . traZODone (DESYREL) 50 MG tablet Take 1 tablet (50 mg total) by mouth at bedtime. 30 tablet 1  . vardenafil (LEVITRA) 20 MG tablet Take 1 tablet (20 mg total) by mouth daily as needed for erectile dysfunction. 6 tablet 5  . escitalopram (LEXAPRO) 20 MG tablet Take 1 tablet (20 mg total) by mouth daily. 30 tablet 2   No current  facility-administered medications on file prior to visit.    Review of Systems  Constitutional: Negative for other unusual diaphoresis or sweats HENT: Negative for ear discharge or swelling Eyes: Negative for other worsening visual disturbances Respiratory: Negative for stridor or other swelling  Gastrointestinal: Negative for worsening distension or other blood Genitourinary: Negative for retention or other urinary change Musculoskeletal: Negative for other MSK pain or swelling Skin: Negative for color change or other new lesions Neurological: Negative for worsening tremors and other numbness  Psychiatric/Behavioral: Negative for worsening agitation or other fatigue All other system neg per pt    Objective:   Physical Exam BP 112/76   Pulse 78   Temp 97.9 F (36.6 C) (Oral)   Ht 6\' 2"  (1.88 m)   Wt (!) 334 lb (151.5 kg)   SpO2 98%   BMI 42.88 kg/m  VS noted,  Constitutional: Pt appears in NAD HENT: Head: NCAT.  Right Ear: External ear normal.  Left Ear: External ear normal.  Eyes: . Pupils are equal, round, and reactive to light. Conjunctivae and EOM are normal Nose: without d/c or deformity Neck: Neck supple. Gross normal ROM Cardiovascular: Normal rate and regular rhythm.   Pulmonary/Chest: Effort normal and breath sounds without rales or wheezing.  Abd:  Soft, NT, ND, + BS, no organomegaly Neurological: Pt is alert. At baseline orientation, motor grossly intact Skin: Skin is warm. No rashes, other new lesions, no LE edema Psychiatric: Pt behavior is normal without agitation  No other exam findings    Assessment & Plan:

## 2017-09-18 NOTE — Assessment & Plan Note (Signed)
Lab Results  Component Value Date   LDLCALC 106 (H) 09/16/2017  declines statin, cont diet

## 2017-09-18 NOTE — Assessment & Plan Note (Signed)
stable overall by history and exam, recent data reviewed with pt, and pt to continue medical treatment as before,  to f/u any worsening symptoms or concerns  

## 2017-09-18 NOTE — Assessment & Plan Note (Signed)
Ok for referral pulm per pt reqeust

## 2017-09-18 NOTE — Assessment & Plan Note (Signed)
For repeat testosterone level

## 2017-09-18 NOTE — Assessment & Plan Note (Signed)
stable overall by history and exam, recent data reviewed with pt, and pt to continue medical treatment as before,  to f/u any worsening symptoms or concerns, for f/u lab 

## 2017-10-10 ENCOUNTER — Other Ambulatory Visit: Payer: Self-pay | Admitting: Internal Medicine

## 2017-11-07 ENCOUNTER — Encounter: Payer: Self-pay | Admitting: Pulmonary Disease

## 2017-11-07 ENCOUNTER — Ambulatory Visit (INDEPENDENT_AMBULATORY_CARE_PROVIDER_SITE_OTHER): Payer: BLUE CROSS/BLUE SHIELD | Admitting: Pulmonary Disease

## 2017-11-07 VITALS — BP 110/82 | HR 62 | Ht 74.0 in | Wt 333.0 lb

## 2017-11-07 DIAGNOSIS — G4733 Obstructive sleep apnea (adult) (pediatric): Secondary | ICD-10-CM

## 2017-11-07 DIAGNOSIS — F4312 Post-traumatic stress disorder, chronic: Secondary | ICD-10-CM | POA: Diagnosis not present

## 2017-11-07 NOTE — Progress Notes (Signed)
[facility]  Chief Complaint  Patient presents with  . New Consult    OSA, CPAP through [name]     Vital signs: BP 110/82 (BP Location: Left Arm, Cuff Size: Normal)   Pulse 62   Ht 6\' 2"  (1.88 m)   Wt (!) 333 lb (151 kg)   SpO2 91%   BMI 42.75 kg/m   History of Present Illness: [name] is a 52 y.o. male for evaluation of sleep problems.  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  His sleep issues started after he was in the [employer].  He has trouble falling asleep and staying asleep.  He was told that his snoring started while in the [employer].  He was diagnosed with PTSD about 1 year ago related to [employer].  He goes to sleep at 8 pm.  He falls asleep after 2 hours.  He wakes up a few times to use the bathroom.  He gets out of bed at 430 am.  He feels tired in the morning.  He denies morning headache.  He takes several medications to help him sleep and for his PTSD.  He is also on chronic medications for pain symptoms.  Since being on these medications he has gained approximately 20 to 25 lbs.  With this weight gain he noticed more trouble with his sleep and snoring.  He had a sleep study in November 2018 and was found to have moderate sleep apnea.  He was started on CPAP.  He denies sleep walking, sleep talking, bruxism, or nightmares.  There is no history of restless legs.  He denies sleep hallucinations, sleep paralysis, or cataplexy.  The Epworth score is 16 out of 24.    Physical Exam:  General - pleasant Eyes - pupils reactive ENT - no sinus tenderness, no oral exudate, no LAN Cardiac - regular, no murmur Chest - no wheeze, rales Abd - soft, non tender Ext - no edema Skin - no rashes Neuro - normal strength Psych - normal mood  Discussion: 52 yo male veteran with history of PTSD and chronic pain.  He has continued sleep difficulties with sleep disruption and daytime sleepiness.  He is on  several medications for PTSD and pain that can contribute to weight gain.  With weight gain his sleep issues have gotten worse, and was ultimately found to have moderate obstructive sleep apnea.  In my opinion, it is more than likely that his weight gain contributed to the development of his obstructive sleep apnea.  Assessment/Plan:  Obstructive sleep apnea. - continue auto CPAP  - he will f[facility]u at [facility]  PTSD. - followed at [facility]  Morbid obesity. - concern that this is exacerbated by several of his psychiatric medications for PTSD - discussed options to assist with weight loss   Patient Instructions  Can ask about whether you can get referral to Dr. Danton Sewer at [facility] to manage your sleep apnea  Will call once your note is ready to be picked up   Chesley Mires, MD [place] 11/07/2017, 3:17 PM  Flow Sheet  Sleep tests: PSG 05/26/17 >> AHI 23, SpO2 low 87%  Review of Systems: Constitutional: Positive for unexpected weight change. Negative for fever.  HENT: Negative for congestion, dental problem, ear pain, nosebleeds, postnasal drip, rhinorrhea, sinus pressure, sneezing, sore throat and trouble swallowing.   Eyes: Negative for redness and itching.  Respiratory: Negative  for cough, chest tightness, shortness of breath and wheezing.   Cardiovascular: Negative for palpitations and leg swelling.  Gastrointestinal: Negative for nausea and vomiting.  Genitourinary: Negative for dysuria.  Musculoskeletal: Positive for joint swelling.  Skin: Negative for rash.  Allergic/Immunologic: Positive for environmental allergies. Negative for food allergies and immunocompromised state.  Neurological: Negative for headaches.  Hematological: Does not bruise/bleed easily.  Psychiatric/Behavioral: Negative for dysphoric mood. The patient is nervous/anxious.    Past Medical History: He  has a past medical history of Arthritis, Depression  (07/08/2015), Diabetes mellitus ([place]), Diabetes mellitus, type II ([place]), DJD (degenerative joint disease), Gallstones, GERD (gastroesophageal reflux disease), Gout, HTN (hypertension), Hyperglycemia, Hyperlipidemia, Male hypogonadism (07/08/2015), Obesity, and PONV (postoperative nausea and vomiting).  Past Surgical History: He  has a past surgical history that includes Repair knee ligament; Back Cyst Excision; Tennis elbow surgery, Nerve entrapment; Shoulder Surgery, Reconstruction of Joint; Joint replacement (01/26/2011); RIGHT FOOT; Cholecystectomy (08/11/2011); Knee arthroscopy; Knee arthroscopy; and Colonoscopy with propofol (N/A, 09/15/2015).  Family History: His family history includes Colon cancer (age of onset: 50) in his brother; Ovarian cancer in his maternal grandmother; Sarcoidosis in his mother.  Social History: He  reports that he has never smoked. He has never used smokeless tobacco. He reports that he does not drink alcohol or use drugs.  Medications: Allergies as of 11/07/2017   No Known Allergies     Medication List        Accurate as of 11/07/17  3:17 PM. Always use your most recent med list.          buPROPion 150 MG 12 hr tablet Commonly known as:  WELLBUTRIN SR Take 150 mg by mouth daily.   busPIRone 10 MG tablet Commonly known as:  BUSPAR Take 10 mg by mouth 3 (three) times daily.   canagliflozin 100 MG Tabs tablet Commonly known as:  INVOKANA Take 1 tablet (100 mg total) by mouth daily before breakfast.   cyclobenzaprine 5 MG tablet Commonly known as:  FLEXERIL Take 1 tablet (5 mg total) by mouth 3 (three) times daily as needed for muscle spasms.   diclofenac sodium 1 % Gel Commonly known as:  VOLTAREN Apply 4 g topically 4 (four) times daily as needed.   DULoxetine 30 MG capsule Commonly known as:  CYMBALTA Take 30 mg by mouth daily.   escitalopram 20 MG tablet Commonly known as:  LEXAPRO Take 1 tablet (20 mg total) by mouth daily.   gabapentin  300 MG capsule Commonly known as:  NEURONTIN Take 300 mg by mouth every 8 (eight) hours as needed.   HYDROcodone-acetaminophen 5-325 MG tablet Commonly known as:  NORCO/VICODIN Take 1 tablet by mouth every 6 (six) hours as needed for moderate pain.   indomethacin 50 MG capsule Commonly known as:  INDOCIN TAKE ONE CAPSULE EVERY 6 HOURS AS NEEDED GOUT ONLY   meloxicam 15 MG tablet Commonly known as:  MOBIC TAKE 1 TABLET (15 MG TOTAL) BY MOUTH DAILY.   metFORMIN 500 MG tablet Commonly known as:  GLUCOPHAGE TAKE 2 TABLETS BY MOUTH TWICE A DAY   metoprolol tartrate 25 MG tablet Commonly known as:  LOPRESSOR   olmesartan 20 MG tablet Commonly known as:  BENICAR TAKE 1 TABLET (20 MG TOTAL) BY MOUTH DAILY.   omeprazole 40 MG capsule Commonly known as:  PRILOSEC Take 1 capsule (40 mg total) by mouth daily.   prazosin 2 MG capsule Commonly known as:  MINIPRESS Take 1 capsule (2 mg total) by mouth at  bedtime.   QUEtiapine 25 MG tablet Commonly known as:  SEROQUEL Take 25 mg by mouth at bedtime.   testosterone 50 MG/5GM (1%) Gel Commonly known as:  ANDROGEL Place 5 g onto the skin daily.   traZODone 50 MG tablet Commonly known as:  DESYREL Take 1 tablet (50 mg total) by mouth at bedtime.   vardenafil 20 MG tablet Commonly known as:  LEVITRA Take 1 tablet (20 mg total) by mouth daily as needed for erectile dysfunction.

## 2017-11-07 NOTE — Patient Instructions (Signed)
Can ask about whether you can get referral to Dr. Danton Sewer at [facility] to manage your sleep apnea  Will call once your note is ready to be picked up

## 2017-11-07 NOTE — Progress Notes (Signed)
   Subjective:    Patient ID: [name], male    DOB: 1966/03/03, 52 y.o.   MRN: [id number]  HPI    Review of Systems  Constitutional: Positive for unexpected weight change. Negative for fever.  HENT: Negative for congestion, dental problem, ear pain, nosebleeds, postnasal drip, rhinorrhea, sinus pressure, sneezing, sore throat and trouble swallowing.   Eyes: Negative for redness and itching.  Respiratory: Negative for cough, chest tightness, shortness of breath and wheezing.   Cardiovascular: Negative for palpitations and leg swelling.  Gastrointestinal: Negative for nausea and vomiting.  Genitourinary: Negative for dysuria.  Musculoskeletal: Positive for joint swelling.  Skin: Negative for rash.  Allergic/Immunologic: Positive for environmental allergies. Negative for food allergies and immunocompromised state.  Neurological: Negative for headaches.  Hematological: Does not bruise/bleed easily.  Psychiatric/Behavioral: Negative for dysphoric mood. The patient is nervous/anxious.        Objective:   Physical Exam        Assessment & Plan:

## 2017-11-17 ENCOUNTER — Telehealth: Payer: Self-pay | Admitting: Pulmonary Disease

## 2017-11-17 NOTE — Telephone Encounter (Signed)
Spoke with pt, he states he gave the nurse some paperwork when he was in for an OV with Dr. Halford Chessman. I advised him that I would send this message to [facility]. Please advise.

## 2017-11-18 NOTE — Telephone Encounter (Signed)
No sign of form in VS' nor [name]'s lookat Called spoke with patient to obtain more information so we know exactly what we're looking for Patient reported the [facility] to show [facility] the [facility]SA is connected with his PTSD Per pt was given to Dr Halford Chessman directly for his "expert analysis" No due date on this form per pt  Dr Halford Chessman, do you recall this form?  Thank you.

## 2017-11-22 NOTE — Telephone Encounter (Signed)
Form completed and in my office.  Can also give him copy of my consult note.

## 2017-11-23 NOTE — Telephone Encounter (Signed)
Forms have been retrieved from Dr. Juanetta Gosling office. Pt's consult note has been printed. Forms and consult note have been placed up front for pick up. Pt is aware. Nothing further was needed.

## 2017-12-15 ENCOUNTER — Ambulatory Visit (INDEPENDENT_AMBULATORY_CARE_PROVIDER_SITE_OTHER): Payer: BLUE CROSS/BLUE SHIELD | Admitting: Internal Medicine

## 2017-12-15 ENCOUNTER — Encounter: Payer: Self-pay | Admitting: Internal Medicine

## 2017-12-15 ENCOUNTER — Other Ambulatory Visit (INDEPENDENT_AMBULATORY_CARE_PROVIDER_SITE_OTHER): Payer: BLUE CROSS/BLUE SHIELD

## 2017-12-15 VITALS — BP 118/80 | HR 78 | Temp 97.6°F | Ht 74.0 in | Wt 339.0 lb

## 2017-12-15 DIAGNOSIS — I10 Essential (primary) hypertension: Secondary | ICD-10-CM | POA: Diagnosis not present

## 2017-12-15 DIAGNOSIS — E119 Type 2 diabetes mellitus without complications: Secondary | ICD-10-CM

## 2017-12-15 DIAGNOSIS — H9121 Sudden idiopathic hearing loss, right ear: Secondary | ICD-10-CM

## 2017-12-15 DIAGNOSIS — E291 Testicular hypofunction: Secondary | ICD-10-CM | POA: Diagnosis not present

## 2017-12-15 DIAGNOSIS — R5383 Other fatigue: Secondary | ICD-10-CM

## 2017-12-15 LAB — BASIC METABOLIC PANEL
BUN: 13 mg/dL (ref 6–23)
CHLORIDE: 102 meq/L (ref 96–112)
CO2: 29 mEq/L (ref 19–32)
CREATININE: 0.94 mg/dL (ref 0.40–1.50)
Calcium: 9.6 mg/dL (ref 8.4–10.5)
GFR: 107.94 mL/min (ref >60.00)
GLUCOSE: 86 mg/dL (ref 70–99)
POTASSIUM: 4.2 meq/L (ref 3.5–5.1)
Sodium: 137 mEq/L (ref 135–145)

## 2017-12-15 LAB — HEMOGLOBIN A1C: Hgb A1c MFr Bld: 5.9 % (ref 4.6–6.5)

## 2017-12-15 LAB — HEPATIC FUNCTION PANEL
ALBUMIN: 4.5 g/dL (ref 3.5–5.2)
ALT: 22 U/L (ref 0–53)
AST: 18 U/L (ref 0–37)
Alkaline Phosphatase: 48 U/L (ref 39–117)
BILIRUBIN TOTAL: 0.7 mg/dL (ref 0.2–1.2)
Bilirubin, Direct: 0.1 mg/dL (ref 0.0–0.3)
Total Protein: 7.8 g/dL (ref 6.0–8.3)

## 2017-12-15 LAB — LIPID PANEL
CHOLESTEROL: 165 mg/dL (ref 0–200)
HDL: 39.9 mg/dL (ref >39.00)
LDL CALC: 113 mg/dL — AB (ref 0–99)
NONHDL: 125.06
Total CHOL/HDL Ratio: 4
Triglycerides: 61 mg/dL (ref 0.0–149.0)
VLDL: 12.2 mg/dL (ref 0.0–40.0)

## 2017-12-15 LAB — TESTOSTERONE: Testosterone: 217.99 ng/dL — ABNORMAL LOW (ref 300.00–890.00)

## 2017-12-15 LAB — CBC WITH DIFFERENTIAL/PLATELET
Basophils Absolute: 0 10*3/uL (ref 0.0–0.1)
Basophils Relative: 0.6 % (ref 0.0–3.0)
Eosinophils Absolute: 0.1 10*3/uL (ref 0.0–0.7)
Eosinophils Relative: 1.1 % (ref 0.0–5.0)
HCT: 40 % (ref 39.0–52.0)
Hemoglobin: 13 g/dL (ref 13.0–17.0)
LYMPHS ABS: 2.7 10*3/uL (ref 0.7–4.0)
LYMPHS PCT: 39.7 % (ref 12.0–46.0)
MCHC: 32.4 g/dL (ref 30.0–36.0)
MCV: 81.4 fl (ref 78.0–100.0)
MONOS PCT: 8.6 % (ref 3.0–12.0)
Monocytes Absolute: 0.6 10*3/uL (ref 0.1–1.0)
NEUTROS PCT: 50 % (ref 43.0–77.0)
Neutro Abs: 3.4 10*3/uL (ref 1.4–7.7)
Platelets: 197 10*3/uL (ref 150.0–400.0)
RBC: 4.91 Mil/uL (ref 4.22–5.81)
RDW: 14.1 % (ref 11.5–15.5)
WBC: 6.8 10*3/uL (ref 4.0–10.5)

## 2017-12-15 LAB — C-REACTIVE PROTEIN: CRP: 0.1 mg/dL — ABNORMAL LOW (ref 0.5–20.0)

## 2017-12-15 LAB — TSH: TSH: 1.78 u[IU]/mL (ref 0.35–4.50)

## 2017-12-15 LAB — CORTISOL: CORTISOL PLASMA: 2.6 ug/dL

## 2017-12-15 LAB — SEDIMENTATION RATE: Sed Rate: 30 mm/hr — ABNORMAL HIGH (ref 0–20)

## 2017-12-15 NOTE — Progress Notes (Signed)
Subjective:    Patient ID: [name], male    DOB: 04/07/1966, 52 y.o.   MRN: [id number]  HPI  Here to f/u fatigue; Has gained wt several lbs but not that much for his BMI, and c/o 2-3 months o/w unexplaines sluggishness and fatigue, just cant seem to get up and go.  Pt denies chest pain, increased sob or doe, wheezing, orthopnea, PND, increased LE swelling, palpitations, dizziness or syncope.  Pt denies new neurological symptoms such as new headache, or facial or extremity weakness or numbness   Pt denies polydipsia, polyuria.  Denies worsening reflux, abd pain, dysphagia, n/v, bowel change or blood. Denies worsening depressive symptoms, suicidal ideation, or panic; has ongoing anxiety, not increased recently.    Pt denies fever, wt loss, night sweats, loss of appetite, or other constitutional symptoms   Wt Readings from Last 3 Encounters:  12/15/17 (!) 339 lb (153.8 kg)  11/07/17 (!) 333 lb (151 kg)  09/16/17 (!) 334 lb (151.5 kg)  Also incidentally with right hearing loss  - ? Wax again, but no ear pain, swelling, drainage, HA, ST, sinus symptoms or cough. Past Medical History:  Diagnosis Date  . Arthritis   . Depression 07/08/2015  . Diabetes mellitus ([place])   . Diabetes mellitus, type II ([place])   . DJD (degenerative joint disease)   . Gallstones   . GERD (gastroesophageal reflux disease)   . Gout   . HTN (hypertension)   . Hyperglycemia   . Hyperlipidemia   . Male hypogonadism 07/08/2015  . Obesity   . PONV (postoperative nausea and vomiting)    Past Surgical History:  Procedure Laterality Date  . Back Cyst Excision     x2  . CHOLECYSTECTOMY  08/11/2011   Procedure: LAPAROSCOPIC CHOLECYSTECTOMY WITH INTRAOPERATIVE CHOLANGIOGRAM;  Surgeon: Judieth Keens, DO;  Location: [facility];  Service: General;  Laterality: N/A;  Laparoscopic cholecystectomy with cholangiogram.  . COLONOSCOPY WITH PROPOFOL N/A 09/15/2015   Procedure: COLONOSCOPY WITH PROPOFOL;  Surgeon: Gatha Mayer, MD;   Location: [facility];  Service: Endoscopy;  Laterality: N/A;  . JOINT REPLACEMENT  01/26/2011   right knee  . KNEE ARTHROSCOPY     x 2 left  . KNEE ARTHROSCOPY     x 2 right  . REPAIR KNEE LIGAMENT     Left  . RIGHT FOOT     BONES STRAIGHTENED OUT   2006  . Shoulder Surgery, Reconstruction of Joint     Left x 3, Last 02/2007  . Tennis elbow surgery, Nerve entrapment     x2 right    reports that he has never smoked. He has never used smokeless tobacco. He reports that he does not drink alcohol or use drugs. family history includes Colon cancer (age of onset: 79) in his brother; Ovarian cancer in his maternal grandmother; Sarcoidosis in his mother. No Known Allergies Current Outpatient Medications on File Prior to Visit  Medication Sig Dispense Refill  . buPROPion (WELLBUTRIN SR) 150 MG 12 hr tablet Take 150 mg by mouth daily.    . busPIRone (BUSPAR) 10 MG tablet Take 10 mg by mouth 3 (three) times daily.    . canagliflozin (INVOKANA) 100 MG TABS tablet Take 1 tablet (100 mg total) by mouth daily before breakfast. 90 tablet 3  . cyclobenzaprine (FLEXERIL) 5 MG tablet Take 1 tablet (5 mg total) by mouth 3 (three) times daily as needed for muscle spasms. 40 tablet 1  . diclofenac sodium (VOLTAREN) 1 % GEL  Apply 4 g topically 4 (four) times daily as needed. 400 g 3  . DULoxetine (CYMBALTA) 30 MG capsule Take 30 mg by mouth daily.    [name] gabapentin (NEURONTIN) 300 MG capsule Take 300 mg by mouth every 8 (eight) hours as needed.    [name] HYDROcodone-acetaminophen (NORCO/VICODIN) 5-325 MG tablet Take 1 tablet by mouth every 6 (six) hours as needed for moderate pain. 40 tablet 0  . indomethacin (INDOCIN) 50 MG capsule TAKE ONE CAPSULE EVERY 6 HOURS AS NEEDED GOUT ONLY 60 capsule 2  . meloxicam (MOBIC) 15 MG tablet TAKE 1 TABLET (15 MG TOTAL) BY MOUTH DAILY. 90 tablet 3  . metFORMIN (GLUCOPHAGE) 500 MG tablet TAKE 2 TABLETS BY MOUTH TWICE A DAY 180 tablet 1  . metoprolol tartrate (LOPRESSOR) 25 MG  tablet     . olmesartan (BENICAR) 20 MG tablet TAKE 1 TABLET (20 MG TOTAL) BY MOUTH DAILY. 90 tablet 3  . omeprazole (PRILOSEC) 40 MG capsule Take 1 capsule (40 mg total) by mouth daily. 90 capsule 3  . prazosin (MINIPRESS) 2 MG capsule Take 1 capsule (2 mg total) by mouth at bedtime. 30 capsule 1  . QUEtiapine (SEROQUEL) 25 MG tablet Take 25 mg by mouth at bedtime.    [name] testosterone (ANDROGEL) 50 MG/5GM (1%) GEL Place 5 g onto the skin daily. 450 g 1  . traZODone (DESYREL) 50 MG tablet Take 1 tablet (50 mg total) by mouth at bedtime. (Patient taking differently: Take 100 mg by mouth at bedtime. ) 30 tablet 1  . vardenafil (LEVITRA) 20 MG tablet Take 1 tablet (20 mg total) by mouth daily as needed for erectile dysfunction. 6 tablet 5  . escitalopram (LEXAPRO) 20 MG tablet Take 1 tablet (20 mg total) by mouth daily. 30 tablet 2   No current facility-administered medications on file prior to visit.    Review of Systems  Constitutional: Negative for other unusual diaphoresis or sweats HENT: Negative for ear discharge or swelling Eyes: Negative for other worsening visual disturbances Respiratory: Negative for stridor or other swelling  Gastrointestinal: Negative for worsening distension or other blood Genitourinary: Negative for retention or other urinary change Musculoskeletal: Negative for other MSK pain or swelling Skin: Negative for color change or other new lesions Neurological: Negative for worsening tremors and other numbness  Psychiatric/Behavioral: Negative for worsening agitation or other fatigue All other system neg per pt    Objective:   Physical Exam BP 118/80   Pulse 78   Temp 97.6 F (36.4 C) (Oral)   Ht 6\' 2"  (1.88 m)   Wt (!) 339 lb (153.8 kg)   SpO2 97%   BMI 43.53 kg/m  VS noted,  Constitutional: Pt appears in NAD HENT: Head: NCAT.  Right Ear: External ear normal. Right canal wax impaction resolved with irrigation, and hearing improved Left Ear: External ear  normal.  Eyes: . Pupils are equal, round, and reactive to light. Conjunctivae and EOM are normal Nose: without d/c or deformity Neck: Neck supple. Gross normal ROM Cardiovascular: Normal rate and regular rhythm.   Pulmonary/Chest: Effort normal and breath sounds without rales or wheezing.  Abd:  Soft, NT, ND, + BS, no organomegaly Neurological: Pt is alert. At baseline orientation, motor grossly intact Skin: Skin is warm. No rashes, other new lesions, no LE edema Psychiatric: Pt behavior is normal without agitation  No other exam findings  Lab Results  Component Value Date   WBC 5.9 03/15/2017   HGB 13.9 03/15/2017   HCT  44.1 03/15/2017   PLT 208.0 03/15/2017   GLUCOSE 103 (H) 09/16/2017   CHOL 157 09/16/2017   TRIG 42.0 09/16/2017   HDL 43.20 09/16/2017   LDLCALC 106 (H) 09/16/2017   ALT 21 09/16/2017   AST 14 09/16/2017   NA 140 09/16/2017   K 4.2 09/16/2017   CL 105 09/16/2017   CREATININE 0.87 09/16/2017   BUN 17 09/16/2017   CO2 28 09/16/2017   TSH 0.91 03/15/2017   PSA 0.28 03/15/2017   INR 0.96 05/14/2011   HGBA1C 5.9 09/16/2017   MICROALBUR <0.7 03/15/2017       Assessment & Plan:

## 2017-12-15 NOTE — Patient Instructions (Signed)
Your right ear was flushed today  Please continue all other medications as before, and refills have been done if requested.  Please have the pharmacy call with any other refills you may need.  Please continue your efforts at being more active, low cholesterol diet, and weight control.  You are otherwise up to date with prevention measures today.  Please keep your appointments with your specialists as you may have planned - Dr Halford Chessman for the sleep apnea  Please go to the LAB in the Basement (turn left off the elevator) for the tests to be done today  You will be contacted by phone if any changes need to be made immediately.  Otherwise, you will receive a letter about your results with an explanation, but please check with MyChart first.  Please remember to sign up for MyChart if you have not done so, as this will be important to you in the future with finding out test results, communicating by private email, and scheduling acute appointments online when needed.

## 2017-12-16 ENCOUNTER — Other Ambulatory Visit: Payer: Self-pay | Admitting: Internal Medicine

## 2017-12-16 DIAGNOSIS — E2740 Unspecified adrenocortical insufficiency: Secondary | ICD-10-CM

## 2017-12-16 LAB — VITAMIN D 25 HYDROXY (VIT D DEFICIENCY, FRACTURES): VITD: 25.78 ng/mL — AB (ref 30.00–100.00)

## 2017-12-17 LAB — EXTRACTABLE NUCLEAR ANTIGEN ANTIBODY
ENA SM Ab Ser-aCnc: <0.2 AI (ref 0.0–0.9)
ENA SSA (RO) Ab: <0.2 AI (ref 0.0–0.9)
Scleroderma SCL-70: <0.2 AI (ref 0.0–0.9)
dsDNA Ab: <1 IU/mL (ref 0–9)

## 2017-12-17 LAB — RHEUMATOID ARTHRITIS PROFILE
CYCLIC CITRULLIN PEPTIDE AB: 1 U (ref 0–19)
Rhuematoid fact SerPl-aCnc: <10 IU/mL (ref 0.0–13.9)

## 2017-12-18 DIAGNOSIS — H9121 Sudden idiopathic hearing loss, right ear: Secondary | ICD-10-CM | POA: Insufficient documentation

## 2017-12-18 NOTE — Assessment & Plan Note (Signed)
stable overall by history and exam, recent data reviewed with pt, and pt to continue medical treatment as before,  to f/u any worsening symptoms or concerns BP Readings from Last 3 Encounters:  12/15/17 118/80  11/07/17 110/82  09/16/17 112/76

## 2017-12-18 NOTE — Assessment & Plan Note (Signed)
Also for testosterone check,  to f/u any worsening symptoms or concerns  

## 2017-12-18 NOTE — Assessment & Plan Note (Signed)
Resolved with irrigation. 

## 2017-12-18 NOTE — Assessment & Plan Note (Signed)
Unusual for him, new onset recent despite morbid obesity ongoing for some time, no overt etiology, for labs including cortisol today

## 2017-12-18 NOTE — Assessment & Plan Note (Signed)
stable overall by history and exam, recent data reviewed with pt, and pt to continue medical treatment as before,  to f/u any worsening symptoms or concerns Lab Results  Component Value Date   HGBA1C 5.9 12/15/2017

## 2018-01-12 NOTE — Progress Notes (Signed)
Patient ID: [name], male   DOB: January 15, 1966, 52 y.o.   MRN: [id number]           Reason for consultation: Low testosterone and cortisone   Chief complaint: Fatigue  History of Present Illness  The patient has been referred by Dr. Cathlean Cower  Hypogonadismwas diagnosed in 2012  He has had complaints offatigue, decreased motivation, decreased libido, and general feeling bad The symptoms started a few years ago and patient is unclear about his history but he thinks the symptoms are worse for the last 2 months or so In 2012 his free testosterone level was low normal at 47 with total 180    There is no history of the following: Hot flushes, sweats, breast enlargement, long term anabolic steroid use, history of testicular injury mumps in childhood. No history of osteopenia or low impact fracture  Prior lab results showtestosterone levels of:  Lab Results  Component Value Date   TESTOSTERONE 217.99 (L) 12/15/2017   TESTOSTERONE 243.76 (L) 09/16/2017   TESTOSTERONE 238.01 (L) 03/15/2017   TESTOSTERONE 231.77 (L) 07/08/2015    Prolactin level: Not available  No results found for: [facility]  Lab Results  Component Value Date   TSH 1.78 12/15/2017   TSH 0.91 03/15/2017   TSH 1.26 07/08/2015       Testoserone supplements that hehas used AndroGel in 2016 which he thinks he took for a few months, no more than 6 months He is unclear whether he felt subjectively better with this but may have had more energy He thinks he also had a prescription given by the [facility] He did not like the use of the gel as it was messy and he stopped taking this on his own No follow-up levels after the treatment are available in the records  Since then he has been on and off taking health food store supplement such as "male T" and has been taking this for the last 5 to 6 months without any improvement in how he feels  He is currently complaining of marked fatigue, lack of  motivation, decreased libido at least for 2 months  Recent testosterone level was done late afternoon No free testosterone level available  LOW CORTISOL level:  He had a random 4 PM cortisol level done because of the fatigue which was low normal at 2.6 However he has no symptoms of lightheadedness, faintness, decreased appetite, weight loss He is actually gaining weight and craving sweets rather than salty foods   DIABETES: He has had type 2 diabetes treated with metformin Diagnosis initially was in 2014 with an A1c of 10.9 He says his blood sugars are near 100 usually at home  He was prescribed Invokana also in March but he has not filled the prescription  Wt Readings from Last 3 Encounters:  01/13/18 (!) 344 lb 3.2 oz (156.1 kg)  12/15/17 (!) 339 lb (153.8 kg)  11/07/17 (!) 333 lb (151 kg)    Lab Results  Component Value Date   HGBA1C 5.9 12/15/2017   HGBA1C 5.9 09/16/2017   HGBA1C 5.7 03/15/2017   Lab Results  Component Value Date   MICROALBUR <0.7 03/15/2017   LDLCALC 113 (H) 12/15/2017   CREATININE 0.94 12/15/2017       Allergies as of 01/13/2018   No Known Allergies     Medication List        Accurate as of 01/13/18  2:10 PM. Always use your most recent med list.  buPROPion 150 MG 12 hr tablet Commonly known as:  WELLBUTRIN SR Take 150 mg by mouth daily.   busPIRone 10 MG tablet Commonly known as:  BUSPAR Take 10 mg by mouth 3 (three) times daily.   canagliflozin 100 MG Tabs tablet Commonly known as:  INVOKANA Take 1 tablet (100 mg total) by mouth daily before breakfast.   cyclobenzaprine 5 MG tablet Commonly known as:  FLEXERIL Take 1 tablet (5 mg total) by mouth 3 (three) times daily as needed for muscle spasms.   diclofenac sodium 1 % Gel Commonly known as:  VOLTAREN Apply 4 g topically 4 (four) times daily as needed.   DULoxetine 30 MG capsule Commonly known as:  CYMBALTA Take 30 mg by mouth daily.   escitalopram 20 MG  tablet Commonly known as:  LEXAPRO Take 1 tablet (20 mg total) by mouth daily.   gabapentin 300 MG capsule Commonly known as:  NEURONTIN Take 300 mg by mouth every 8 (eight) hours as needed.   HYDROcodone-acetaminophen 5-325 MG tablet Commonly known as:  NORCO/VICODIN Take 1 tablet by mouth every 6 (six) hours as needed for moderate pain.   indomethacin 50 MG capsule Commonly known as:  INDOCIN TAKE ONE CAPSULE EVERY 6 HOURS AS NEEDED GOUT ONLY   meloxicam 15 MG tablet Commonly known as:  MOBIC TAKE 1 TABLET (15 MG TOTAL) BY MOUTH DAILY.   metFORMIN 500 MG tablet Commonly known as:  GLUCOPHAGE TAKE 2 TABLETS BY MOUTH TWICE A DAY   metoprolol tartrate 25 MG tablet Commonly known as:  LOPRESSOR   olmesartan 20 MG tablet Commonly known as:  BENICAR TAKE 1 TABLET (20 MG TOTAL) BY MOUTH DAILY.   omeprazole 40 MG capsule Commonly known as:  PRILOSEC Take 1 capsule (40 mg total) by mouth daily.   prazosin 2 MG capsule Commonly known as:  MINIPRESS Take 1 capsule (2 mg total) by mouth at bedtime.   QUEtiapine 25 MG tablet Commonly known as:  SEROQUEL Take 25 mg by mouth at bedtime.   testosterone 50 MG/5GM (1%) Gel Commonly known as:  ANDROGEL Place 5 g onto the skin daily.   traZODone 50 MG tablet Commonly known as:  DESYREL Take 1 tablet (50 mg total) by mouth at bedtime.   vardenafil 20 MG tablet Commonly known as:  LEVITRA Take 1 tablet (20 mg total) by mouth daily as needed for erectile dysfunction.       Allergies: No Known Allergies  Past Medical History:  Diagnosis Date  . Arthritis   . Depression 07/08/2015  . Diabetes mellitus ([place])   . Diabetes mellitus, type II ([place])   . DJD (degenerative joint disease)   . Gallstones   . GERD (gastroesophageal reflux disease)   . Gout   . HTN (hypertension)   . Hyperglycemia   . Hyperlipidemia   . Male hypogonadism 07/08/2015  . Obesity   . PONV (postoperative nausea and vomiting)     Past Surgical  History:  Procedure Laterality Date  . Back Cyst Excision     x2  . CHOLECYSTECTOMY  08/11/2011   Procedure: LAPAROSCOPIC CHOLECYSTECTOMY WITH INTRAOPERATIVE CHOLANGIOGRAM;  Surgeon: Judieth Keens, DO;  Location: [facility];  Service: General;  Laterality: N/A;  Laparoscopic cholecystectomy with cholangiogram.  . COLONOSCOPY WITH PROPOFOL N/A 09/15/2015   Procedure: COLONOSCOPY WITH PROPOFOL;  Surgeon: Gatha Mayer, MD;  Location: [facility];  Service: Endoscopy;  Laterality: N/A;  . JOINT REPLACEMENT  01/26/2011   right knee  . KNEE  ARTHROSCOPY     x 2 left  . KNEE ARTHROSCOPY     x 2 right  . REPAIR KNEE LIGAMENT     Left  . RIGHT FOOT     BONES STRAIGHTENED OUT   2006  . Shoulder Surgery, Reconstruction of Joint     Left x 3, Last 02/2007  . Tennis elbow surgery, Nerve entrapment     x2 right    Family History  Problem Relation Age of Onset  . Sarcoidosis Mother   . Colon cancer Brother 75       half-brother  . Cancer Neg Hx   . Heart disease Neg Hx   . Kidney disease Neg Hx   . Ovarian cancer Maternal Grandmother     Social History:  reports that he has never smoked. He has never used smokeless tobacco. He reports that he does not drink alcohol or use drugs.  Review of Systems  Constitutional: Positive for weight gain and malaise. Negative for reduced appetite.  HENT: Positive for headaches. Negative for trouble swallowing.   Eyes: Negative for blurred vision.  Respiratory: Negative for shortness of breath.   Cardiovascular: Negative for leg swelling.  Gastrointestinal: Negative for abdominal pain.  Endocrine: Positive for fatigue. Negative for light-headedness.  Musculoskeletal: Positive for joint pain.       Has knee joint pains  Skin: Negative for rash.  Neurological: Positive for numbness.       Periodically his leg will get numb, mostly on the side starting from the thigh, may happen during sleep  Psychiatric/Behavioral: Positive for depressed mood and  insomnia.      General Examination:   BP 112/72 (BP Location: Left Arm, Patient Position: Standing, Cuff Size: Normal)   Pulse (!) 108   Ht 6\' 2"  (1.88 m)   Wt (!) 344 lb 3.2 oz (156.1 kg)   SpO2 98%   BMI 44.19 kg/m   GENERAL APPEARANCE generalized obesity present.  SKIN:normal, no rash or pigmentation.  HEENT:Oral mucosa normal.    EYES:normal external appearance of eyes, Fundii show normal discs and vessels.    NECK:no lymphadenopathy, no thyromegaly.   CHEST: Gynecomastia present bilaterally, mild with firm breast tissue measuring about 3 cm across LUNGS:clear to auscultation bilaterally, no wheezes, rhonchi, rales.  HEART:normal S1 And S2, no S3, S4, murmur or click.  ABDOMEN:no hepatosplenomegaly, no mass palpated, soft and not distended  MALE GENITOURINARY:left testicle about 3 cm and right testicle about 3.5 cm, right side slightly softer with prominent epididymis.   MUSCULOSKELETALNo enlargement or deformity of joints.  EXTREMITIES:no clubbing, no edema.   NEUROLOGIC EXAM: Biceps reflexes normal (2+) bilaterally.   Assessment/ Plan:  Hypogonadism, diagnosed at least 2-1/2 years ago, currently not on treatment  Etiology is unknown and no baseline evaluation of pituitary function available Notably his free testosterone level was low normal even when his total testosterone was low at 180 at baseline  He does need to have a morning free testosterone level checked to first establish that he has true hypogonadism Also needs LH and prolactin levels checked  Discussed various options for testosterone supplementation including transdermal gel or liquid preparations, Androderm patch, testosterone injections and clomiphene.  Discussed pros and cons of various treatments He may prefer clomiphene since he has difficulty using AndroGel and may require relatively high dose with his obesity Also discussed  etiology of his hypogonadism would be likely related to insulin resistance syndrome and obesity  FATIGUE: This is likely multifactorial and may be related  to hypogonadism, depression, and inadequate treatment of sleep apnea with symptoms of decreased motivation, fatigue, decreased libido and daytime somnolence  Low cortisol level: This is done in the late afternoon and need to have fasting morning cortisol checked However given his clinical symptoms this is highly unlikely to be a cause of his fatigue, he has no typical symptoms of adrenal insufficiency especially weight loss, decreased appetite or persistent nausea  DIABETES: This is mild and well controlled with metformin alone despite his significant obesity, lack of consistent diet and exercise regimen  A copy of the consultation note has been sent to the referring physician   Elayne Snare 01/13/2018, 2:10 PM     Note: This office note was prepared with Dragon voice recognition system technology. Any transcriptional errors that result from this process are unintentional.

## 2018-01-13 ENCOUNTER — Encounter: Payer: Self-pay | Admitting: Endocrinology

## 2018-01-13 ENCOUNTER — Ambulatory Visit (INDEPENDENT_AMBULATORY_CARE_PROVIDER_SITE_OTHER): Payer: BLUE CROSS/BLUE SHIELD | Admitting: Endocrinology

## 2018-01-13 VITALS — BP 112/72 | HR 108 | Ht 74.0 in | Wt 344.2 lb

## 2018-01-13 DIAGNOSIS — R5383 Other fatigue: Secondary | ICD-10-CM

## 2018-01-13 DIAGNOSIS — R7989 Other specified abnormal findings of blood chemistry: Secondary | ICD-10-CM

## 2018-01-13 DIAGNOSIS — E291 Testicular hypofunction: Secondary | ICD-10-CM | POA: Diagnosis not present

## 2018-01-13 DIAGNOSIS — E2740 Unspecified adrenocortical insufficiency: Secondary | ICD-10-CM | POA: Diagnosis not present

## 2018-01-16 ENCOUNTER — Other Ambulatory Visit (INDEPENDENT_AMBULATORY_CARE_PROVIDER_SITE_OTHER): Payer: BLUE CROSS/BLUE SHIELD

## 2018-01-16 DIAGNOSIS — E2740 Unspecified adrenocortical insufficiency: Secondary | ICD-10-CM | POA: Diagnosis not present

## 2018-01-16 DIAGNOSIS — R5383 Other fatigue: Secondary | ICD-10-CM | POA: Diagnosis not present

## 2018-01-16 DIAGNOSIS — R7989 Other specified abnormal findings of blood chemistry: Secondary | ICD-10-CM

## 2018-01-16 DIAGNOSIS — E291 Testicular hypofunction: Secondary | ICD-10-CM

## 2018-01-16 LAB — COMPREHENSIVE METABOLIC PANEL
ALT: 24 U/L (ref 0–53)
AST: 18 U/L (ref 0–37)
Albumin: 4.1 g/dL (ref 3.5–5.2)
Alkaline Phosphatase: 44 U/L (ref 39–117)
BUN: 14 mg/dL (ref 6–23)
CHLORIDE: 103 meq/L (ref 96–112)
CO2: 28 meq/L (ref 19–32)
CREATININE: 0.87 mg/dL (ref 0.40–1.50)
Calcium: 9.4 mg/dL (ref 8.4–10.5)
GFR: 117.99 mL/min (ref >60.00)
Glucose, Bld: 103 mg/dL — ABNORMAL HIGH (ref 70–99)
POTASSIUM: 4.6 meq/L (ref 3.5–5.1)
SODIUM: 139 meq/L (ref 135–145)
Total Bilirubin: 0.6 mg/dL (ref 0.2–1.2)
Total Protein: 7.3 g/dL (ref 6.0–8.3)

## 2018-01-16 LAB — CORTISOL: Cortisol, Plasma: 5.9 ug/dL

## 2018-01-16 LAB — T4, FREE: Free T4: 0.8 ng/dL (ref 0.60–1.60)

## 2018-01-16 NOTE — Addendum Note (Signed)
Addended by: Isaiah Serge D on: 01/16/2018 09:32 AM   Modules accepted: Orders

## 2018-01-16 NOTE — Addendum Note (Signed)
Addended by: Isaiah Serge D on: 01/16/2018 09:33 AM   Modules accepted: Orders

## 2018-01-17 LAB — LUTEINIZING HORMONE: LH: 7.4 m[IU]/mL (ref 1.7–8.6)

## 2018-01-17 LAB — PROLACTIN: Prolactin: 8.3 ng/mL (ref 4.0–15.2)

## 2018-01-18 ENCOUNTER — Telehealth: Payer: Self-pay | Admitting: Endocrinology

## 2018-01-18 LAB — TESTOSTERONE, FREE, TOTAL, SHBG
Sex Hormone Binding: 21 nmol/L (ref 19.3–76.4)
Testosterone, Free: 8 pg/mL (ref 7.2–24.0)
Testosterone: 358 ng/dL (ref 264–916)

## 2018-01-18 NOTE — Telephone Encounter (Signed)
Patient would like lab results.

## 2018-01-19 ENCOUNTER — Other Ambulatory Visit: Payer: Self-pay

## 2018-01-19 MED ORDER — CLOMIPHENE CITRATE 50 MG PO TABS: ORAL_TABLET | ORAL | 3 refills | Status: DC

## 2018-01-19 NOTE — Telephone Encounter (Signed)
please see result note and results have been released for patient

## 2018-03-08 ENCOUNTER — Ambulatory Visit (INDEPENDENT_AMBULATORY_CARE_PROVIDER_SITE_OTHER): Payer: BLUE CROSS/BLUE SHIELD | Admitting: Orthopedic Surgery

## 2018-03-09 ENCOUNTER — Other Ambulatory Visit: Payer: Self-pay | Admitting: Internal Medicine

## 2018-03-20 ENCOUNTER — Ambulatory Visit (INDEPENDENT_AMBULATORY_CARE_PROVIDER_SITE_OTHER): Payer: BLUE CROSS/BLUE SHIELD | Admitting: Orthopedic Surgery

## 2018-03-24 ENCOUNTER — Ambulatory Visit: Payer: Self-pay | Admitting: Internal Medicine

## 2018-03-26 NOTE — Progress Notes (Signed)
Patient ID: [name], male   DOB: October 01, 1965, 52 y.o.   MRN: [id number]           Chief complaint: Fatigue and low testosterone  History of Present Illness  Hypogonadismwas diagnosed in 2012  He has had complaints offatigue, decreased motivation, decreased libido, and general feeling bad The symptoms started a few years ago and somewhat worse for couple of months before his visit in 7/19 No obvious causes of low testosterone were identified on his initial consultation In 2012 his free testosterone level was low normal at 47 with total 180  Repeat free testosterone was 8.0 done on 01/16/2018 in the morning at 9:30 AM  He is now on an empirical trial of clomiphene 25 mg 3 times a week Although he had previously been irregular with his testosterone gel and he was told not to continue this he says he is now applying 2 pumps on each arm of the testosterone preparation and has been more regular in the last 3 weeks Also he had said that he did not like the use of the gel as it was messy but he is doing this now without complaints  He thinks his energy level is much better and he has had more motivation, generally feels good and not complaining of decreased libido He started feeling better a couple of weeks after starting his clomiphene He takes his half tablet in the mornings, 3 days a week  He has not had follow-up labs done  Prior lab results showtestosterone levels of:  Lab Results  Component Value Date   TESTOSTERONE 358 01/16/2018   TESTOSTERONE 217.99 (L) 12/15/2017   TESTOSTERONE 243.76 (L) 09/16/2017   TESTOSTERONE 238.01 (L) 03/15/2017    Prolactin level: 8.3  Baseline LH of 7.4  No results found for: LH  No evidence of secondary hypothyroidism:  Lab Results  Component Value Date   TSH 1.78 12/15/2017   TSH 0.91 03/15/2017   TSH 1.26 07/08/2015   FREET4 0.80 01/16/2018       Previous treatment: He had used AndroGel since 2016 from his PCP which he  thinks he took for a few months, no more than 6 months He is unclear whether he felt subjectively better with this but may have had more energy He thinks he also had a prescription given by the [facility] level:  He had a random 4 PM cortisol level done because of the fatigue which was low normal at 2.6 Follow-up morning cortisol was in the normal range    DIABETES: He has had type 2 diabetes treated with metformin Diagnosis initially was in 2014 with an A1c of 10.9 He says his blood sugars are near 100 usually at home  He was prescribed Invokana also in March by his PCP  Wt Readings from Last 3 Encounters:  03/27/18 (!) 340 lb (154.2 kg)  01/13/18 (!) 344 lb 3.2 oz (156.1 kg)  12/15/17 (!) 339 lb (153.8 kg)    Lab Results  Component Value Date   HGBA1C 5.9 12/15/2017   HGBA1C 5.9 09/16/2017   HGBA1C 5.7 03/15/2017   Lab Results  Component Value Date   MICROALBUR <0.7 03/15/2017   LDLCALC 113 (H) 12/15/2017   CREATININE 0.87 01/16/2018       Allergies as of 03/27/2018   No Known Allergies     Medication List        Accurate as of 03/27/18  3:34 PM. Always use your most recent med  list.          buPROPion 150 MG 12 hr tablet Commonly known as:  WELLBUTRIN SR Take 150 mg by mouth daily.   busPIRone 10 MG tablet Commonly known as:  BUSPAR Take 10 mg by mouth 3 (three) times daily.   canagliflozin 100 MG Tabs tablet Commonly known as:  INVOKANA Take 1 tablet (100 mg total) by mouth daily before breakfast.   clomiPHENE 50 MG tablet Commonly known as:  CLOMID TAKE 1/2 TABLET BY MOUTH 3 TIMES A WEEK.   cyclobenzaprine 5 MG tablet Commonly known as:  FLEXERIL Take 1 tablet (5 mg total) by mouth 3 (three) times daily as needed for muscle spasms.   diclofenac sodium 1 % Gel Commonly known as:  VOLTAREN Apply 4 g topically 4 (four) times daily as needed.   DULoxetine 30 MG capsule Commonly known as:  CYMBALTA Take 30 mg by mouth  daily.   escitalopram 20 MG tablet Commonly known as:  LEXAPRO Take 1 tablet (20 mg total) by mouth daily.   gabapentin 300 MG capsule Commonly known as:  NEURONTIN Take 300 mg by mouth every 8 (eight) hours as needed.   HYDROcodone-acetaminophen 5-325 MG tablet Commonly known as:  NORCO/VICODIN Take 1 tablet by mouth every 6 (six) hours as needed for moderate pain.   indomethacin 50 MG capsule Commonly known as:  INDOCIN TAKE ONE CAPSULE EVERY 6 HOURS AS NEEDED GOUT ONLY   meloxicam 15 MG tablet Commonly known as:  MOBIC TAKE 1 TABLET (15 MG TOTAL) BY MOUTH DAILY.   metFORMIN 500 MG tablet Commonly known as:  GLUCOPHAGE TAKE 2 TABLETS BY MOUTH TWICE A DAY   metoprolol tartrate 25 MG tablet Commonly known as:  LOPRESSOR   olmesartan 20 MG tablet Commonly known as:  BENICAR TAKE 1 TABLET (20 MG TOTAL) BY MOUTH DAILY.   omeprazole 40 MG capsule Commonly known as:  PRILOSEC TAKE 1 CAPSULE BY MOUTH EVERY DAY   prazosin 2 MG capsule Commonly known as:  MINIPRESS Take 1 capsule (2 mg total) by mouth at bedtime.   QUEtiapine 25 MG tablet Commonly known as:  SEROQUEL Take 25 mg by mouth at bedtime.   testosterone 50 MG/5GM (1%) Gel Commonly known as:  ANDROGEL Place 5 g onto the skin daily.   traZODone 50 MG tablet Commonly known as:  DESYREL Take 1 tablet (50 mg total) by mouth at bedtime.   vardenafil 20 MG tablet Commonly known as:  LEVITRA Take 1 tablet (20 mg total) by mouth daily as needed for erectile dysfunction.       Allergies: No Known Allergies  Past Medical History:  Diagnosis Date  . Arthritis   . Depression 07/08/2015  . Diabetes mellitus ([place])   . Diabetes mellitus, type II ([place])   . DJD (degenerative joint disease)   . Gallstones   . GERD (gastroesophageal reflux disease)   . Gout   . HTN (hypertension)   . Hyperglycemia   . Hyperlipidemia   . Male hypogonadism 07/08/2015  . Obesity   . PONV (postoperative nausea and vomiting)      Past Surgical History:  Procedure Laterality Date  . Back Cyst Excision     x2  . CHOLECYSTECTOMY  08/11/2011   Procedure: LAPAROSCOPIC CHOLECYSTECTOMY WITH INTRAOPERATIVE CHOLANGIOGRAM;  Surgeon: Judieth Keens, DO;  Location: [facility];  Service: General;  Laterality: N/A;  Laparoscopic cholecystectomy with cholangiogram.  . COLONOSCOPY WITH PROPOFOL N/A 09/15/2015   Procedure: COLONOSCOPY WITH PROPOFOL;  Surgeon: Gatha Mayer, MD;  Location: [facility];  Service: Endoscopy;  Laterality: N/A;  . JOINT REPLACEMENT  01/26/2011   right knee  . KNEE ARTHROSCOPY     x 2 left  . KNEE ARTHROSCOPY     x 2 right  . REPAIR KNEE LIGAMENT     Left  . RIGHT FOOT     BONES STRAIGHTENED OUT   2006  . Shoulder Surgery, Reconstruction of Joint     Left x 3, Last 02/2007  . Tennis elbow surgery, Nerve entrapment     x2 right    Family History  Problem Relation Age of Onset  . Sarcoidosis Mother   . Colon cancer Brother 93       half-brother  . Cancer Neg Hx   . Heart disease Neg Hx   . Kidney disease Neg Hx   . Ovarian cancer Maternal Grandmother     Social History:  reports that he has never smoked. He has never used smokeless tobacco. He reports that he does not drink alcohol or use drugs.  Review of Systems    General Examination:   BP 128/74   Pulse 80   Ht 6\' 2"  (1.88 m)   Wt (!) 340 lb (154.2 kg)   SpO2 98%   BMI 43.65 kg/m   He has a residual of the AndroGel on his upper outer arms   Assessment/ Plan:  Hypogonadism, diagnosed around 2016  Recently prior to starting treatment his free testosterone level was low normal  Prolactin and LH were normal although LH was upper normal  However he is says that he is subjectively doing much better with starting clomiphene in July and was starting to feel benefit in about 2 weeks Although he says that he had not liked to use the AndroGel and was told not to restart it he is currently using 4 pumps of this per day in the  morning He also has been more compliant with this every day recently  Recommendations: Need to check fasting testosterone level and adjust the dose of testosterone gel, most likely will not need 4 pumps a day Recheck LH to see the changes with clomiphene Check CBC Regular follow-up and continue to titrate his medication based on lab Encouraged him to lose weight to improve insulin sensitivity and discussed that this may improve endogenous testosterone somewhat also     Elayne Snare 03/27/2018, 3:34 PM     Note: This office note was prepared with Dragon voice recognition system technology. Any transcriptional errors that result from this process are unintentional.

## 2018-03-27 ENCOUNTER — Ambulatory Visit: Payer: BLUE CROSS/BLUE SHIELD | Admitting: Endocrinology

## 2018-03-27 ENCOUNTER — Encounter: Payer: Self-pay | Admitting: Endocrinology

## 2018-03-27 VITALS — BP 128/74 | HR 80 | Ht 74.0 in | Wt 340.0 lb

## 2018-03-27 DIAGNOSIS — R5383 Other fatigue: Secondary | ICD-10-CM

## 2018-03-27 DIAGNOSIS — R7989 Other specified abnormal findings of blood chemistry: Secondary | ICD-10-CM

## 2018-03-29 ENCOUNTER — Other Ambulatory Visit (INDEPENDENT_AMBULATORY_CARE_PROVIDER_SITE_OTHER): Payer: BLUE CROSS/BLUE SHIELD

## 2018-03-29 DIAGNOSIS — R7989 Other specified abnormal findings of blood chemistry: Secondary | ICD-10-CM

## 2018-03-29 DIAGNOSIS — R5383 Other fatigue: Secondary | ICD-10-CM | POA: Diagnosis not present

## 2018-03-29 LAB — CBC
HCT: 38.8 % — ABNORMAL LOW (ref 39.0–52.0)
HEMOGLOBIN: 12.7 g/dL — AB (ref 13.0–17.0)
MCHC: 32.7 g/dL (ref 30.0–36.0)
MCV: 80 fl (ref 78.0–100.0)
Platelets: 173 10*3/uL (ref 150.0–400.0)
RBC: 4.85 Mil/uL (ref 4.22–5.81)
RDW: 13.9 % (ref 11.5–15.5)
WBC: 9 10*3/uL (ref 4.0–10.5)

## 2018-03-29 LAB — TESTOSTERONE: TESTOSTERONE: 230.38 ng/dL — AB (ref 300.00–890.00)

## 2018-03-29 LAB — LUTEINIZING HORMONE: LH: 13.04 m[IU]/mL — ABNORMAL HIGH (ref 1.50–9.30)

## 2018-03-30 ENCOUNTER — Ambulatory Visit (INDEPENDENT_AMBULATORY_CARE_PROVIDER_SITE_OTHER): Payer: BLUE CROSS/BLUE SHIELD | Admitting: Internal Medicine

## 2018-03-30 ENCOUNTER — Encounter: Payer: Self-pay | Admitting: Internal Medicine

## 2018-03-30 VITALS — BP 112/66 | HR 72 | Temp 98.6°F | Ht 74.0 in | Wt 339.0 lb

## 2018-03-30 DIAGNOSIS — Z0000 Encounter for general adult medical examination without abnormal findings: Secondary | ICD-10-CM | POA: Diagnosis not present

## 2018-03-30 DIAGNOSIS — G8929 Other chronic pain: Secondary | ICD-10-CM | POA: Diagnosis not present

## 2018-03-30 DIAGNOSIS — F4310 Post-traumatic stress disorder, unspecified: Secondary | ICD-10-CM

## 2018-03-30 DIAGNOSIS — E119 Type 2 diabetes mellitus without complications: Secondary | ICD-10-CM

## 2018-03-30 MED ORDER — SILDENAFIL CITRATE 100 MG PO TABS: 50.0000 mg | ORAL_TABLET | Freq: Every day | ORAL | 11 refills | Status: DC | PRN

## 2018-03-30 MED ORDER — HYDROCODONE-ACETAMINOPHEN 5-325 MG PO TABS: 1.0000 | ORAL_TABLET | Freq: Four times a day (QID) | ORAL | 0 refills | Status: DC | PRN

## 2018-03-30 MED ORDER — INDOMETHACIN 50 MG PO CAPS: ORAL_CAPSULE | ORAL | 2 refills | Status: DC

## 2018-03-30 NOTE — Assessment & Plan Note (Signed)
stable overall by history and exam, recent data reviewed with pt, and pt to continue medical treatment as before,  to f/u any worsening symptoms or concerns  

## 2018-03-30 NOTE — Patient Instructions (Addendum)
Please take all new medication as prescribed  - the viagra as needed  Please continue all other medications as before, and refills have been done if requested - the pain medication  You will be contacted regarding the referral for: Counseling (psychology)  Please have the pharmacy call with any other refills you may need.  Please continue your efforts at being more active, low cholesterol diet, and weight control.  You are otherwise up to date with prevention measures today.  Please keep your appointments with your specialists as you may have planned  Please return in 6 months, or sooner if needed

## 2018-03-30 NOTE — Assessment & Plan Note (Signed)
[facility] for psychology referral

## 2018-03-30 NOTE — Assessment & Plan Note (Signed)
Left knee, for limited hydrocodone prn, f/u ortho as planned

## 2018-03-30 NOTE — Assessment & Plan Note (Signed)

## 2018-03-30 NOTE — Progress Notes (Signed)
Subjective:    Patient ID: [name], male    DOB: [birth year], 52 y.o.   MRN: [id number]  HPI  Here for wellness and f/u;  Overall doing ok;  Pt denies Chest pain, worsening SOB, DOE, wheezing, orthopnea, PND, worsening LE edema, palpitations, dizziness or syncope.  Pt denies neurological change such as new headache, facial or extremity weakness.  Pt denies polydipsia, polyuria, or low sugar symptoms. Pt states overall good compliance with treatment and medications, good tolerability, and has been trying to follow appropriate diet.  Pt denies worsening depressive symptoms, suicidal ideation or panic. No fever, night sweats, wt loss, loss of appetite, or other constitutional symptoms.  Pt states good ability with ADL's, has low fall risk, home safety reviewed and adequate, no other significant changes in hearing or vision, and only occasionally active with exercise. Has some worsening ED symptoms in the past few months,asks for viagra prn.  Has ongoing chronic left knee pain near end stage already at his age, recent cortisone helped somewhat but per pt will get 1 or 2 more injections then have to have left knee TKR.  Also asks for psychology referral for PTSD Past Medical History:  Diagnosis Date  . Arthritis   . Depression 07/08/2015  . Diabetes mellitus ([place])   . Diabetes mellitus, type II ([place])   . DJD (degenerative joint disease)   . Gallstones   . GERD (gastroesophageal reflux disease)   . Gout   . HTN (hypertension)   . Hyperglycemia   . Hyperlipidemia   . Male hypogonadism 07/08/2015  . Obesity   . PONV (postoperative nausea and vomiting)    Past Surgical History:  Procedure Laterality Date  . Back Cyst Excision     x2  . CHOLECYSTECTOMY  08/11/2011   Procedure: LAPAROSCOPIC CHOLECYSTECTOMY WITH INTRAOPERATIVE CHOLANGIOGRAM;  Surgeon: Judieth Keens, DO;  Location: [facility];  Service: General;  Laterality: N/A;  Laparoscopic cholecystectomy with cholangiogram.  . COLONOSCOPY WITH  PROPOFOL N/A 09/15/2015   Procedure: COLONOSCOPY WITH PROPOFOL;  Surgeon: Gatha Mayer, MD;  Location: [facility];  Service: Endoscopy;  Laterality: N/A;  . JOINT REPLACEMENT  01/26/2011   right knee  . KNEE ARTHROSCOPY     x 2 left  . KNEE ARTHROSCOPY     x 2 right  . REPAIR KNEE LIGAMENT     Left  . RIGHT FOOT     BONES STRAIGHTENED OUT   2006  . Shoulder Surgery, Reconstruction of Joint     Left x 3, Last 02/2007  . Tennis elbow surgery, Nerve entrapment     x2 right    reports that he has never smoked. He has never used smokeless tobacco. He reports that he does not drink alcohol or use drugs. family history includes Colon cancer (age of onset: 43) in his brother; Ovarian cancer in his maternal grandmother; Sarcoidosis in his mother. No Known Allergies Current Outpatient Medications on File Prior to Visit  Medication Sig Dispense Refill  . buPROPion (WELLBUTRIN SR) 150 MG 12 hr tablet Take 150 mg by mouth daily.    . busPIRone (BUSPAR) 10 MG tablet Take 10 mg by mouth 3 (three) times daily.    . canagliflozin (INVOKANA) 100 MG TABS tablet Take 1 tablet (100 mg total) by mouth daily before breakfast. 90 tablet 3  . clomiPHENE (CLOMID) 50 MG tablet TAKE 1/2 TABLET BY MOUTH 3 TIMES A WEEK. 6 tablet 3  . cyclobenzaprine (FLEXERIL) 5 MG tablet Take 1 tablet (  5 mg total) by mouth 3 (three) times daily as needed for muscle spasms. 40 tablet 1  . diclofenac sodium (VOLTAREN) 1 % GEL Apply 4 g topically 4 (four) times daily as needed. 400 g 3  . DULoxetine (CYMBALTA) 30 MG capsule Take 30 mg by mouth daily.    [name] gabapentin (NEURONTIN) 300 MG capsule Take 300 mg by mouth every 8 (eight) hours as needed.    . meloxicam (MOBIC) 15 MG tablet TAKE 1 TABLET (15 MG TOTAL) BY MOUTH DAILY. 90 tablet 3  . metFORMIN (GLUCOPHAGE) 500 MG tablet TAKE 2 TABLETS BY MOUTH TWICE A DAY 180 tablet 1  . metoprolol tartrate (LOPRESSOR) 25 MG tablet     . olmesartan (BENICAR) 20 MG tablet TAKE 1 TABLET (20 MG  TOTAL) BY MOUTH DAILY. 90 tablet 3  . omeprazole (PRILOSEC) 40 MG capsule TAKE 1 CAPSULE BY MOUTH EVERY DAY 90 capsule 1  . prazosin (MINIPRESS) 2 MG capsule Take 1 capsule (2 mg total) by mouth at bedtime. 30 capsule 1  . QUEtiapine (SEROQUEL) 25 MG tablet Take 25 mg by mouth at bedtime.    [name] testosterone (ANDROGEL) 50 MG/5GM (1%) GEL Place 5 g onto the skin daily. 450 g 1  . traZODone (DESYREL) 50 MG tablet Take 1 tablet (50 mg total) by mouth at bedtime. (Patient taking differently: Take 100 mg by mouth at bedtime. ) 30 tablet 1  . vardenafil (LEVITRA) 20 MG tablet Take 1 tablet (20 mg total) by mouth daily as needed for erectile dysfunction. 6 tablet 5  . escitalopram (LEXAPRO) 20 MG tablet Take 1 tablet (20 mg total) by mouth daily. 30 tablet 2   No current facility-administered medications on file prior to visit.    Review of Systems Constitutional: Negative for other unusual diaphoresis, sweats, appetite or weight changes HENT: Negative for other worsening hearing loss, ear pain, facial swelling, mouth sores or neck stiffness.   Eyes: Negative for other worsening pain, redness or other visual disturbance.  Respiratory: Negative for other stridor or swelling Cardiovascular: Negative for other palpitations or other chest pain  Gastrointestinal: Negative for worsening diarrhea or loose stools, blood in stool, distention or other pain Genitourinary: Negative for hematuria, flank pain or other change in urine volume.  Musculoskeletal: Negative for myalgias or other joint swelling.  Skin: Negative for other color change, or other wound or worsening drainage.  Neurological: Negative for other syncope or numbness. Hematological: Negative for other adenopathy or swelling Psychiatric/Behavioral: Negative for hallucinations, other worsening agitation, SI, self-injury, or new decreased concentration All other system neg per pt    Objective:   Physical Exam BP 112/66   Pulse 72   Temp 98.6 F  (37 C) (Oral)   Ht 6\' 2"  (1.88 m)   Wt (!) 339 lb (153.8 kg)   SpO2 96%   BMI 43.53 kg/m  VS noted, morbid obese Constitutional: Pt is oriented to person, place, and time. Appears well-developed and well-nourished, in no significant distress and comfortable Head: Normocephalic and atraumatic  Eyes: Conjunctivae and EOM are normal. Pupils are equal, round, and reactive to light Right Ear: External ear normal without discharge Left Ear: External ear normal without discharge Nose: Nose without discharge or deformity Mouth/Throat: Oropharynx is without other ulcerations and moist  Neck: Normal range of motion. Neck supple. No JVD present. No tracheal deviation present or significant neck LA or mass Cardiovascular: Normal rate, regular rhythm, normal heart sounds and intact distal pulses.   Pulmonary/Chest: WOB normal  and breath sounds without rales or wheezing  Abdominal: Soft. Bowel sounds are normal. NT. No HSM  Musculoskeletal: Normal range of motion. Exhibits no edema Lymphadenopathy: Has no other cervical adenopathy.  Neurological: Pt is alert and oriented to person, place, and time. Pt has normal reflexes. No cranial nerve deficit. Motor grossly intact, Gait intact Skin: Skin is warm and dry. No rash noted or new ulcerations Psychiatric:  Has normal mood and affect. Behavior is normal without agitation No other exam findings Lab Results  Component Value Date   WBC 9.0 03/29/2018   HGB 12.7 (L) 03/29/2018   HCT 38.8 (L) 03/29/2018   PLT 173.0 03/29/2018   GLUCOSE 103 (H) 01/16/2018   CHOL 165 12/15/2017   TRIG 61.0 12/15/2017   HDL 39.90 12/15/2017   LDLCALC 113 (H) 12/15/2017   ALT 24 01/16/2018   AST 18 01/16/2018   NA 139 01/16/2018   K 4.6 01/16/2018   CL 103 01/16/2018   CREATININE 0.87 01/16/2018   BUN 14 01/16/2018   CO2 28 01/16/2018   TSH 1.78 12/15/2017   PSA 0.28 03/15/2017   INR 0.96 05/14/2011   HGBA1C 5.9 12/15/2017   MICROALBUR <0.7 03/15/2017         Assessment & Plan:

## 2018-04-03 ENCOUNTER — Telehealth: Payer: Self-pay | Admitting: Internal Medicine

## 2018-04-03 ENCOUNTER — Ambulatory Visit (INDEPENDENT_AMBULATORY_CARE_PROVIDER_SITE_OTHER): Payer: BLUE CROSS/BLUE SHIELD | Admitting: Internal Medicine

## 2018-04-03 ENCOUNTER — Encounter: Payer: Self-pay | Admitting: Internal Medicine

## 2018-04-03 VITALS — BP 100/62 | HR 87 | Temp 99.3°F | Ht 74.0 in | Wt 340.0 lb

## 2018-04-03 DIAGNOSIS — R05 Cough: Secondary | ICD-10-CM

## 2018-04-03 DIAGNOSIS — I10 Essential (primary) hypertension: Secondary | ICD-10-CM

## 2018-04-03 DIAGNOSIS — M109 Gout, unspecified: Secondary | ICD-10-CM

## 2018-04-03 DIAGNOSIS — R059 Cough, unspecified: Secondary | ICD-10-CM

## 2018-04-03 MED ORDER — METHYLPREDNISOLONE ACETATE 80 MG/ML IJ SUSP
80.0000 mg | Freq: Once | INTRAMUSCULAR | Status: AC
  Administered 2018-04-03: 80 mg via INTRAMUSCULAR

## 2018-04-03 MED ORDER — AZITHROMYCIN 250 MG PO TABS: ORAL_TABLET | ORAL | 1 refills | Status: DC

## 2018-04-03 MED ORDER — HYDROCODONE-HOMATROPINE 5-1.5 MG/5ML PO SYRP: 5.0000 mL | ORAL_SOLUTION | Freq: Four times a day (QID) | ORAL | 0 refills | Status: AC | PRN

## 2018-04-03 NOTE — Assessment & Plan Note (Signed)
stable overall by history and exam, recent data reviewed with pt, and pt to continue medical treatment as before,  to f/u any worsening symptoms or concerns  

## 2018-04-03 NOTE — Assessment & Plan Note (Signed)
Mild recurrent left great toe MTP - for depomedrol IM 80

## 2018-04-03 NOTE — Telephone Encounter (Signed)
-----   Message from Merril Abbe, [place] sent at 04/03/2018 11:56 AM EDT ----- FYI:  Your patient is scheduled to see Clint Bolder on 05/12/18. Pt is aware.  Thank you for the referral.

## 2018-04-03 NOTE — Patient Instructions (Signed)
You had the steroid shot today  Please take all new medication as prescribed - the antibiotic, and cough medicine if needed  You can also take Mucinex (or it's generic off brand) for congestion, and tylenol as needed for pain.  Please continue all other medications as before, and refills have been done if requested.  Please have the pharmacy call with any other refills you may need.  Please keep your appointments with your specialists as you may have planned

## 2018-04-03 NOTE — Assessment & Plan Note (Signed)
Mild to mod c/w bronchitis vs pna, declines cxr,  for antibx course,  to f/u any worsening symptoms or concerns

## 2018-04-03 NOTE — Progress Notes (Signed)
Subjective:    Patient ID: [name], male    DOB: [birth year], 52 y.o.   MRN: [id number]  HPI  Here with acute onset mild to mod 2-3 days ST, HA, general weakness and malaise, with prod cough greenish sputum, but Pt denies chest pain, increased sob or doe, wheezing, orthopnea, PND, increased LE swelling, palpitations, dizziness or syncope,  Pt denies new neurological symptoms such as new headache, or facial or extremity weakness or numbness   Pt denies polydipsia, polyuria.  Does also incidentally have left great MTP with red, tender, swelling x 3 days without fever or trauma Past Medical History:  Diagnosis Date  . Arthritis   . Depression 07/08/2015  . Diabetes mellitus ([place])   . Diabetes mellitus, type II ([place])   . DJD (degenerative joint disease)   . Gallstones   . GERD (gastroesophageal reflux disease)   . Gout   . HTN (hypertension)   . Hyperglycemia   . Hyperlipidemia   . Male hypogonadism 07/08/2015  . Obesity   . PONV (postoperative nausea and vomiting)    Past Surgical History:  Procedure Laterality Date  . Back Cyst Excision     x2  . CHOLECYSTECTOMY  08/11/2011   Procedure: LAPAROSCOPIC CHOLECYSTECTOMY WITH INTRAOPERATIVE CHOLANGIOGRAM;  Surgeon: Judieth Keens, DO;  Location: [facility];  Service: General;  Laterality: N/A;  Laparoscopic cholecystectomy with cholangiogram.  . COLONOSCOPY WITH PROPOFOL N/A 09/15/2015   Procedure: COLONOSCOPY WITH PROPOFOL;  Surgeon: Gatha Mayer, MD;  Location[facility];  Service: Endoscopy;  Laterality: N/A;  . JOINT REPLACEMENT  01/26/2011   right knee  . KNEE ARTHROSCOPY     x 2 left  . KNEE ARTHROSCOPY     x 2 right  . REPAIR KNEE LIGAMENT     Left  . RIGHT FOOT     BONES STRAIGHTENED OUT   2006  . Shoulder Surgery, Reconstruction of Joint     Left x 3, Last 02/2007  . Tennis elbow surgery, Nerve entrapment     x2 right    reports that he has never smoked. He has never used smokeless tobacco. He reports that he does not  drink alcohol or use drugs. family history includes Colon cancer (age of onset: 37) in his brother; Ovarian cancer in his maternal grandmother; Sarcoidosis in his mother. No Known Allergies Current Outpatient Medications on File Prior to Visit  Medication Sig Dispense Refill  . buPROPion (WELLBUTRIN SR) 150 MG 12 hr tablet Take 150 mg by mouth daily.    . busPIRone (BUSPAR) 10 MG tablet Take 10 mg by mouth 3 (three) times daily.    . canagliflozin (INVOKANA) 100 MG TABS tablet Take 1 tablet (100 mg total) by mouth daily before breakfast. 90 tablet 3  . clomiPHENE (CLOMID) 50 MG tablet TAKE 1/2 TABLET BY MOUTH 3 TIMES A WEEK. 6 tablet 3  . cyclobenzaprine (FLEXERIL) 5 MG tablet Take 1 tablet (5 mg total) by mouth 3 (three) times daily as needed for muscle spasms. 40 tablet 1  . diclofenac sodium (VOLTAREN) 1 % GEL Apply 4 g topically 4 (four) times daily as needed. 400 g 3  . DULoxetine (CYMBALTA) 30 MG capsule Take 30 mg by mouth daily.    [name] gabapentin (NEURONTIN) 300 MG capsule Take 300 mg by mouth every 8 (eight) hours as needed.    [name] HYDROcodone-acetaminophen (NORCO/VICODIN) 5-325 MG tablet Take 1 tablet by mouth every 6 (six) hours as needed for moderate pain. 40 tablet  0  . indomethacin (INDOCIN) 50 MG capsule TAKE ONE CAPSULE EVERY 6 HOURS AS NEEDED GOUT ONLY 60 capsule 2  . meloxicam (MOBIC) 15 MG tablet TAKE 1 TABLET (15 MG TOTAL) BY MOUTH DAILY. 90 tablet 3  . metFORMIN (GLUCOPHAGE) 500 MG tablet TAKE 2 TABLETS BY MOUTH TWICE A DAY 180 tablet 1  . metoprolol tartrate (LOPRESSOR) 25 MG tablet     . olmesartan (BENICAR) 20 MG tablet TAKE 1 TABLET (20 MG TOTAL) BY MOUTH DAILY. 90 tablet 3  . omeprazole (PRILOSEC) 40 MG capsule TAKE 1 CAPSULE BY MOUTH EVERY DAY 90 capsule 1  . prazosin (MINIPRESS) 2 MG capsule Take 1 capsule (2 mg total) by mouth at bedtime. 30 capsule 1  . QUEtiapine (SEROQUEL) 25 MG tablet Take 25 mg by mouth at bedtime.    . sildenafil (VIAGRA) 100 MG tablet Take 0.5-1  tablets (50-100 mg total) by mouth daily as needed for erectile dysfunction. 5 tablet 11  . testosterone (ANDROGEL) 50 MG/5GM (1%) GEL Place 5 g onto the skin daily. 450 g 1  . traZODone (DESYREL) 50 MG tablet Take 1 tablet (50 mg total) by mouth at bedtime. (Patient taking differently: Take 100 mg by mouth at bedtime. ) 30 tablet 1  . vardenafil (LEVITRA) 20 MG tablet Take 1 tablet (20 mg total) by mouth daily as needed for erectile dysfunction. 6 tablet 5  . escitalopram (LEXAPRO) 20 MG tablet Take 1 tablet (20 mg total) by mouth daily. 30 tablet 2   No current facility-administered medications on file prior to visit.    Review of Systems  Constitutional: Negative for other unusual diaphoresis or sweats HENT: Negative for ear discharge or swelling Eyes: Negative for other worsening visual disturbances Respiratory: Negative for stridor or other swelling  Gastrointestinal: Negative for worsening distension or other blood Genitourinary: Negative for retention or other urinary change Musculoskeletal: Negative for other MSK pain or swelling Skin: Negative for color change or other new lesions Neurological: Negative for worsening tremors and other numbness  Psychiatric/Behavioral: Negative for worsening agitation or other fatigue All other system neg per pt    Objective:   Physical Exam BP 100/62 (BP Location: Left Arm, Patient Position: Sitting, Cuff Size: Large)   Pulse 87   Temp 99.3 F (37.4 C) (Oral)   Ht 6\' 2"  (1.88 m)   Wt (!) 340 lb (154.2 kg)   SpO2 97%   BMI 43.65 kg/m  VS noted, mild ill Constitutional: Pt appears in NAD HENT: Head: NCAT.  Right Ear: External ear normal.  Left Ear: External ear normal.  Bilat tm's with mild erythema.  Max sinus areas non tender.  Pharynx with mild erythema, no exudate Eyes: . Pupils are equal, round, and reactive to light. Conjunctivae and EOM are normal Nose: without d/c or deformity Neck: Neck supple. Gross normal ROM Cardiovascular:  Normal rate and regular rhythm.   Pulmonary/Chest: Effort normal and breath sounds decreased without rales but with mild scattered bilat wheezing.  Abd:  Soft, NT, ND, + BS, no organomegaly Neurological: Pt is alert. At baseline orientation, motor grossly intact Right great MTP with 1+ tender, swelling Skin: Skin is warm. No rashes, other new lesions, no LE edema Psychiatric: Pt behavior is normal without agitation  No other exam findings    Assessment & Plan:

## 2018-04-05 ENCOUNTER — Other Ambulatory Visit: Payer: Self-pay | Admitting: Endocrinology

## 2018-04-05 MED ORDER — TESTOSTERONE 20.25 MG/ACT (1.62%) TD GEL: TRANSDERMAL | 2 refills | Status: DC

## 2018-04-07 ENCOUNTER — Encounter: Payer: Self-pay | Admitting: *Deleted

## 2018-04-07 ENCOUNTER — Telehealth: Payer: Self-pay | Admitting: Internal Medicine

## 2018-04-07 NOTE — Telephone Encounter (Signed)
Notified pt MD ok excuse note. Will leave up front for pick-up.Marland KitchenJohny Chess

## 2018-04-07 NOTE — Telephone Encounter (Signed)
[facility] for work note for this week only

## 2018-04-07 NOTE — Telephone Encounter (Signed)
Pls advise if ok for excuse note.Marland KitchenJohny Mclaughlin

## 2018-04-07 NOTE — Telephone Encounter (Signed)
Copied from [place] [id number]. Topic: Quick Communication - See Telephone Encounter >> Apr 07, 2018  9:05 AM Antonieta Iba C wrote: CRM for notification. See Telephone encounter for: 04/07/18.  Pt called in to request a Dr. Note to excuse him from work. Pt says that he was seen on Monday and has been out all week. Pt says that he will come in to pick up.   CB: [phone]

## 2018-04-09 ENCOUNTER — Other Ambulatory Visit: Payer: Self-pay | Admitting: Internal Medicine

## 2018-04-13 ENCOUNTER — Ambulatory Visit: Payer: BLUE CROSS/BLUE SHIELD | Admitting: Podiatry

## 2018-04-13 ENCOUNTER — Ambulatory Visit (INDEPENDENT_AMBULATORY_CARE_PROVIDER_SITE_OTHER): Payer: BLUE CROSS/BLUE SHIELD

## 2018-04-13 DIAGNOSIS — M2012 Hallux valgus (acquired), left foot: Secondary | ICD-10-CM | POA: Diagnosis not present

## 2018-04-13 NOTE — Patient Instructions (Signed)
Pre-Operative Instructions  Congratulations, you have decided to take an important step towards improving your quality of life.  You can be assured that the doctors and staff at [facility] will be with you every step of the way.  Here are some important things you should know:  1. Plan to be at the surgery center/hospital at least 1 (one) hour prior to your scheduled time, unless otherwise directed by the surgical center/hospital staff.  You must have a responsible adult accompany you, remain during the surgery and drive you home.  Make sure you have directions to the surgical center/hospital to ensure you arrive on time. 2. If you are having surgery at [facility] or [facility] hospitals, you will need a copy of your medical history and physical form from your family physician within one month prior to the date of surgery. We will give you a form for your primary physician to complete.  3. We make every effort to accommodate the date you request for surgery.  However, there are times where surgery dates or times have to be moved.  We will contact you as soon as possible if a change in schedule is required.   4. No aspirin/ibuprofen for one week before surgery.  If you are on aspirin, any non-steroidal anti-inflammatory medications (Mobic, Aleve, Ibuprofen) should not be taken seven (7) days prior to your surgery.  You make take Tylenol for pain prior to surgery.  5. Medications - If you are taking daily heart and blood pressure medications, seizure, reflux, allergy, asthma, anxiety, pain or diabetes medications, make sure you notify the surgery center/hospital before the day of surgery so they can tell you which medications you should take or avoid the day of surgery. 6. No food or drink after midnight the night before surgery unless directed otherwise by surgical center/hospital staff. 7. No alcoholic beverages 24-hours prior to surgery.  No smoking 24-hours prior or 24-hours after  surgery. 8. Wear loose pants or shorts. They should be loose enough to fit over bandages, boots, and casts. 9. Don't wear slip-on shoes. Sneakers are preferred. 10. Bring your boot with you to the surgery center/hospital.  Also bring crutches or a walker if your physician has prescribed it for you.  If you do not have this equipment, it will be provided for you after surgery. 11. If you have not been contacted by the surgery center/hospital by the day before your surgery, call to confirm the date and time of your surgery. 12. Leave-time from work may vary depending on the type of surgery you have.  Appropriate arrangements should be made prior to surgery with your employer. 13. Prescriptions will be provided immediately following surgery by your doctor.  Fill these as soon as possible after surgery and take the medication as directed. Pain medications will not be refilled on weekends and must be approved by the doctor. 14. Remove nail polish on the operative foot and avoid getting pedicures prior to surgery. 15. Wash the night before surgery.  The night before surgery wash the foot and leg well with water and the antibacterial soap provided. Be sure to pay special attention to beneath the toenails and in between the toes.  Wash for at least three (3) minutes. Rinse thoroughly with water and dry well with a towel.  Perform this wash unless told not to do so by your physician.  Enclosed: 1 Ice pack (please put in freezer the night before surgery)   1 Hibiclens skin cleaner     Pre-op instructions  If you have any questions regarding the instructions, please do not hesitate to call our office.  [facility]: [address], [place], [place] [address] -- [phone]  [facility]: [address] [place], [place] [address] -- [phone]  [place]: [address]  [place], [place] [address] -- [phone]  [place]: [address], [address], [place], [place] [address] -- [phone]  Website:  [email or web address] 

## 2018-04-13 NOTE — Progress Notes (Signed)
Subjective:  Patient ID: [name], male    DOB: [birth year],  MRN: [id number] HPI Chief Complaint  Patient presents with  . Foot Problem    Bunion on 1st toe medial, shooting sensations on top of foot  . Foot Problem    Right foot has something left from previous surgery coming out of top    52 y.o. male presents with the above complaint.   ROS: Denies fever chills nausea vomiting muscle aches pains calf pain back pain chest pain shortness of breath.  Past Medical History:  Diagnosis Date  . Arthritis   . Depression 07/08/2015  . Diabetes mellitus ([place])   . Diabetes mellitus, type II ([place])   . DJD (degenerative joint disease)   . Gallstones   . GERD (gastroesophageal reflux disease)   . Gout   . HTN (hypertension)   . Hyperglycemia   . Hyperlipidemia   . Male hypogonadism 07/08/2015  . Obesity   . PONV (postoperative nausea and vomiting)    Past Surgical History:  Procedure Laterality Date  . Back Cyst Excision     x2  . CHOLECYSTECTOMY  08/11/2011   Procedure: LAPAROSCOPIC CHOLECYSTECTOMY WITH INTRAOPERATIVE CHOLANGIOGRAM;  Surgeon: Judieth Keens, DO;  Location: [facility];  Service: General;  Laterality: N/A;  Laparoscopic cholecystectomy with cholangiogram.  . COLONOSCOPY WITH PROPOFOL N/A 09/15/2015   Procedure: COLONOSCOPY WITH PROPOFOL;  Surgeon: Gatha Mayer, MD;  Location[facility];  Service: Endoscopy;  Laterality: N/A;  . JOINT REPLACEMENT  01/26/2011   right knee  . KNEE ARTHROSCOPY     x 2 left  . KNEE ARTHROSCOPY     x 2 right  . REPAIR KNEE LIGAMENT     Left  . RIGHT FOOT     BONES STRAIGHTENED OUT   2006  . Shoulder Surgery, Reconstruction of Joint     Left x 3, Last 02/2007  . Tennis elbow surgery, Nerve entrapment     x2 right    Current Outpatient Medications:  .  azithromycin (ZITHROMAX Z-PAK) 250 MG tablet, 2 tab by mouth day 1, then 1 per day, Disp: 6 tablet, Rfl: 1 .  buPROPion (WELLBUTRIN SR) 150 MG 12 hr tablet, Take 150 mg by mouth  daily., Disp: , Rfl:  .  busPIRone (BUSPAR) 10 MG tablet, Take 10 mg by mouth 3 (three) times daily., Disp: , Rfl:  .  canagliflozin (INVOKANA) 100 MG TABS tablet, Take 1 tablet (100 mg total) by mouth daily before breakfast., Disp: 90 tablet, Rfl: 3 .  clomiPHENE (CLOMID) 50 MG tablet, TAKE 1/2 TABLET BY MOUTH 3 TIMES A WEEK., Disp: 6 tablet, Rfl: 3 .  cyclobenzaprine (FLEXERIL) 5 MG tablet, Take 1 tablet (5 mg total) by mouth 3 (three) times daily as needed for muscle spasms., Disp: 40 tablet, Rfl: 1 .  diclofenac sodium (VOLTAREN) 1 % GEL, Apply 4 g topically 4 (four) times daily as needed., Disp: 400 g, Rfl: 3 .  DULoxetine (CYMBALTA) 30 MG capsule, Take 30 mg by mouth daily., Disp: , Rfl:  .  escitalopram (LEXAPRO) 20 MG tablet, Take 1 tablet (20 mg total) by mouth daily., Disp: 30 tablet, Rfl: 2 .  gabapentin (NEURONTIN) 300 MG capsule, Take 300 mg by mouth every 8 (eight) hours as needed., Disp: , Rfl:  .  HYDROcodone-acetaminophen (NORCO/VICODIN) 5-325 MG tablet, Take 1 tablet by mouth every 6 (six) hours as needed for moderate pain., Disp: 40 tablet, Rfl: 0 .  HYDROcodone-homatropine (HYCODAN) 5-1.5 MG/5ML syrup, Take  5 mLs by mouth every 6 (six) hours as needed for up to 10 days for cough., Disp: 180 mL, Rfl: 0 .  indomethacin (INDOCIN) 50 MG capsule, TAKE ONE CAPSULE EVERY 6 HOURS AS NEEDED GOUT ONLY, Disp: 60 capsule, Rfl: 2 .  meloxicam (MOBIC) 15 MG tablet, TAKE 1 TABLET (15 MG TOTAL) BY MOUTH DAILY., Disp: 90 tablet, Rfl: 3 .  metFORMIN (GLUCOPHAGE) 500 MG tablet, TAKE 2 TABLETS BY MOUTH TWICE A DAY, Disp: 180 tablet, Rfl: 1 .  metoprolol tartrate (LOPRESSOR) 25 MG tablet, , Disp: , Rfl:  .  olmesartan (BENICAR) 20 MG tablet, TAKE 1 TABLET (20 MG TOTAL) BY MOUTH DAILY., Disp: 90 tablet, Rfl: 3 .  omeprazole (PRILOSEC) 40 MG capsule, TAKE 1 CAPSULE BY MOUTH EVERY DAY, Disp: 90 capsule, Rfl: 1 .  prazosin (MINIPRESS) 2 MG capsule, Take 1 capsule (2 mg total) by mouth at bedtime., Disp:  30 capsule, Rfl: 1 .  QUEtiapine (SEROQUEL) 25 MG tablet, Take 25 mg by mouth at bedtime., Disp: , Rfl:  .  sildenafil (VIAGRA) 100 MG tablet, Take 0.5-1 tablets (50-100 mg total) by mouth daily as needed for erectile dysfunction., Disp: 5 tablet, Rfl: 11 .  Testosterone (ANDROGEL PUMP) 20.25 MG/ACT (1.62%) GEL, 2 pumps on each shoulder every morning, Disp: 150 g, Rfl: 2 .  traZODone (DESYREL) 50 MG tablet, Take 1 tablet (50 mg total) by mouth at bedtime. (Patient taking differently: Take 100 mg by mouth at bedtime. ), Disp: 30 tablet, Rfl: 1 .  vardenafil (LEVITRA) 20 MG tablet, Take 1 tablet (20 mg total) by mouth daily as needed for erectile dysfunction., Disp: 6 tablet, Rfl: 5  No Known Allergies Review of Systems Objective:  There were no vitals filed for this visit.  General: Well developed, nourished, in no acute distress, alert and oriented x3   Dermatological: Skin is warm, dry and supple bilateral. Nails x 10 are well maintained; remaining integument appears unremarkable at this time. There are no open sores, no preulcerative lesions, no rash or signs of infection present.  Vascular: Dorsalis Pedis artery and Posterior Tibial artery pedal pulses are 2/4 bilateral with immedate capillary fill time. Pedal hair growth present. No varicosities and no lower extremity edema present bilateral.   Neruologic: Grossly intact via light touch bilateral. Vibratory intact via tuning fork bilateral. Protective threshold with Semmes Wienstein monofilament intact to all pedal sites bilateral. Patellar and Achilles deep tendon reflexes 2+ bilateral. No Babinski or clonus noted bilateral.   Musculoskeletal: No gross boney pedal deformities bilateral. No pain, crepitus, or limitation noted with foot and ankle range of motion bilateral. Muscular strength 5/5 in all groups tested bilateral.  Gait: Unassisted, Nonantalgic.    Radiographs:  Radiographs taken today demonstrate severe osteoarthritic  changes first metatarsophalangeal joint of left foot bone to bone contact dorsal spurring joint space narrowing and eburnation.  Single K wire first metatarsal right foot.  Assessment & Plan:   Assessment: Pain in limb secondary to rigid hallux limitus with hallux valgus.  Pain in limb secondary to internal fixation first metatarsal right.  Plan: Discussed etiology pathology conservative surgical therapies at this point time consented him for a Keller arthroplasty with a single silicone implant first metatarsal phalangeal joint of the left foot and removal of internal fixation first metatarsal right foot.  We did discuss the possible postop complications which may include but not limited to postop pain bleeding swelling infection recurrence need further surgery overcorrection under correction loss of digit loss of  limb loss of life.  We dispensed a Cam walker today as well as both oral and written home-going instruction for care in preparation of his surgery.  He received instructions for preop.  Surgery center and anesthesia group.  Follow-up with him December 27 for surgery.     Max T. [place], [place]

## 2018-05-03 ENCOUNTER — Encounter: Payer: Self-pay | Admitting: *Deleted

## 2018-05-03 NOTE — Progress Notes (Signed)
Per Dr. Milinda Pointer, I sent a medical clearance letter to Dr. Jenny Reichmann.

## 2018-05-08 ENCOUNTER — Telehealth: Payer: Self-pay | Admitting: Internal Medicine

## 2018-05-08 DIAGNOSIS — R197 Diarrhea, unspecified: Secondary | ICD-10-CM

## 2018-05-08 NOTE — Telephone Encounter (Signed)
Copied from [place] [id number]. Topic: Referral - Request for Referral >> May 08, 2018  2:13 PM Scherrie Gerlach wrote: Has patient seen PCP for this complaint? No, but states dr is aware pt has this issue Pt is requesting a referral for his IBS.  Pt states some of the meds from the [name] are contributing to this issue and he wants Dr Jenny Reichmann to refer. (his anxiety meds)

## 2018-05-10 NOTE — Telephone Encounter (Signed)
yes

## 2018-05-10 NOTE — Telephone Encounter (Signed)
Please clarify - does he mean referral to GI?

## 2018-05-11 ENCOUNTER — Encounter: Payer: Self-pay | Admitting: Sports Medicine

## 2018-05-11 ENCOUNTER — Ambulatory Visit: Payer: BLUE CROSS/BLUE SHIELD | Admitting: Sports Medicine

## 2018-05-11 VITALS — BP 114/76 | Ht 74.0 in | Wt 330.0 lb

## 2018-05-11 DIAGNOSIS — M2141 Flat foot [pes planus] (acquired), right foot: Secondary | ICD-10-CM

## 2018-05-11 DIAGNOSIS — M2142 Flat foot [pes planus] (acquired), left foot: Secondary | ICD-10-CM | POA: Diagnosis not present

## 2018-05-11 DIAGNOSIS — M1712 Unilateral primary osteoarthritis, left knee: Secondary | ICD-10-CM | POA: Diagnosis not present

## 2018-05-11 DIAGNOSIS — M2140 Flat foot [pes planus] (acquired), unspecified foot: Secondary | ICD-10-CM | POA: Insufficient documentation

## 2018-05-11 NOTE — Telephone Encounter (Signed)
Ok for referral?

## 2018-05-11 NOTE — Addendum Note (Signed)
Addended by: Biagio Borg on: 05/11/2018 11:45 AM   Modules accepted: Orders

## 2018-05-11 NOTE — Assessment & Plan Note (Signed)
  We discussed strategies to increase fruit and vegetable intake Cut CHO intake Weight loss will help with knee pain

## 2018-05-11 NOTE — Progress Notes (Signed)
PCP: Biagio Borg, MD  Subjective:   HPI: Patient is a 52 y.o. male here for chronic Left knee pain with known osteoarthritis.  Has knee pain daily, wakes him up at night, worse with standing. Gives out of him often. Using meloxicam occasionally for pain. Mostly "toughs it out" and ices regularly. Has had knees drained before but does not notice it swell often. Works at [employer], is seated during his job due to knee pain. Has gotten many steroid injections, last in August 2019. This only gave him relief for 1 month. Has had viscous injections for 2 rounds, last in May 2019, sees [facility]. - Dr Alvan Dame did his RT knee. He has been told that he needs a knee replacement but he would like to delay it as long as possible; had recent x rays of L knee and was told that his cartilage was completely gone. He had right knee replaced in July 2012. He has had prior L meniscal tear and scope.  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 Is on 90% disability due to PTSD. Basic records reviewed and most on folliculitis barbae. One visit for left LCL strain  Past Medical History:  Diagnosis Date  . Arthritis   . Depression 07/08/2015  . Diabetes mellitus ([place])   . Diabetes mellitus, type II ([place])   . DJD (degenerative joint disease)   . Gallstones   . GERD (gastroesophageal reflux disease)   . Gout   . HTN (hypertension)   . Hyperglycemia   . Hyperlipidemia   . Male hypogonadism 07/08/2015  . Obesity   . PONV (postoperative nausea and vomiting)     Current Outpatient Medications on File Prior to Visit  Medication Sig Dispense Refill  . azithromycin (ZITHROMAX Z-PAK) 250 MG tablet 2 tab by mouth day 1, then 1 per day 6 tablet 1  . buPROPion (WELLBUTRIN SR) 150 MG 12 hr tablet Take 150 mg by mouth daily.    . busPIRone (BUSPAR) 10 MG tablet Take 10 mg by mouth 3 (three) times daily.    .  canagliflozin (INVOKANA) 100 MG TABS tablet Take 1 tablet (100 mg total) by mouth daily before breakfast. 90 tablet 3  . clomiPHENE (CLOMID) 50 MG tablet TAKE 1/2 TABLET BY MOUTH 3 TIMES A WEEK. 6 tablet 3  . cyclobenzaprine (FLEXERIL) 5 MG tablet Take 1 tablet (5 mg total) by mouth 3 (three) times daily as needed for muscle spasms. 40 tablet 1  . diclofenac sodium (VOLTAREN) 1 % GEL Apply 4 g topically 4 (four) times daily as needed. 400 g 3  . DULoxetine (CYMBALTA) 30 MG capsule Take 30 mg by mouth daily.    [name] gabapentin (NEURONTIN) 300 MG capsule Take 300 mg by mouth every 8 (eight) hours as needed.    [name] HYDROcodone-acetaminophen (NORCO/VICODIN) 5-325 MG tablet Take 1 tablet by mouth every 6 (six) hours as needed for moderate pain. 40 tablet 0  . indomethacin (INDOCIN) 50 MG capsule TAKE ONE CAPSULE EVERY 6 HOURS AS NEEDED GOUT ONLY 60 capsule 2  . meloxicam (MOBIC) 15 MG tablet TAKE 1 TABLET (15 MG TOTAL) BY MOUTH DAILY. 90 tablet 3  . metFORMIN (GLUCOPHAGE) 500 MG tablet TAKE 2 TABLETS BY MOUTH TWICE A DAY 180 tablet 1  . metoprolol tartrate (LOPRESSOR) 25 MG tablet     . olmesartan (BENICAR) 20 MG tablet TAKE 1 TABLET (20 MG TOTAL) BY MOUTH DAILY.  90 tablet 3  . omeprazole (PRILOSEC) 40 MG capsule TAKE 1 CAPSULE BY MOUTH EVERY DAY 90 capsule 1  . prazosin (MINIPRESS) 2 MG capsule Take 1 capsule (2 mg total) by mouth at bedtime. 30 capsule 1  . QUEtiapine (SEROQUEL) 25 MG tablet Take 25 mg by mouth at bedtime.    . sildenafil (VIAGRA) 100 MG tablet Take 0.5-1 tablets (50-100 mg total) by mouth daily as needed for erectile dysfunction. 5 tablet 11  . Testosterone (ANDROGEL PUMP) 20.25 MG/ACT (1.62%) GEL 2 pumps on each shoulder every morning 150 g 2  . traZODone (DESYREL) 50 MG tablet Take 1 tablet (50 mg total) by mouth at bedtime. (Patient taking differently: Take 100 mg by mouth at bedtime. ) 30 tablet 1  . vardenafil (LEVITRA) 20 MG tablet Take 1 tablet (20 mg total) by mouth daily as  needed for erectile dysfunction. 6 tablet 5  . escitalopram (LEXAPRO) 20 MG tablet Take 1 tablet (20 mg total) by mouth daily. 30 tablet 2   No current facility-administered medications on file prior to visit.     Past Surgical History:  Procedure Laterality Date  . Back Cyst Excision     x2  . CHOLECYSTECTOMY  08/11/2011   Procedure: LAPAROSCOPIC CHOLECYSTECTOMY WITH INTRAOPERATIVE CHOLANGIOGRAM;  Surgeon: Judieth Keens, DO;  Location: [facility];  Service: General;  Laterality: N/A;  Laparoscopic cholecystectomy with cholangiogram.  . COLONOSCOPY WITH PROPOFOL N/A 09/15/2015   Procedure: COLONOSCOPY WITH PROPOFOL;  Surgeon: Gatha Mayer, MD;  Location: [facility];  Service: Endoscopy;  Laterality: N/A;  . JOINT REPLACEMENT  01/26/2011   right knee  . KNEE ARTHROSCOPY     x 2 left  . KNEE ARTHROSCOPY     x 2 right  . REPAIR KNEE LIGAMENT     Left  . RIGHT FOOT     BONES STRAIGHTENED OUT   2006  . Shoulder Surgery, Reconstruction of Joint     Left x 3, Last 02/2007  . Tennis elbow surgery, Nerve entrapment     x2 right    No Known Allergies  Social History   Occupational History  . Occupation: Advice worker: [employer]  Tobacco Use  . Smoking status: Never Smoker  . Smokeless tobacco: Never Used  Substance and Sexual Activity  . Alcohol use: No    Comment: socially=occasional   . Drug use: No  . Sexual activity: Yes    Birth control/protection: Condom  Lifestyle  Other Topics Concern  . Not on file  Social History Narrative   Works - Banker, out on medical leave x 1 year      Married 8 years - divorced 2004   Daughter 1988 - [employer] doing well. Has her own place   Monogamous relationships    Family History  Problem Relation Age of Onset  . Sarcoidosis Mother   . Colon cancer Brother 46       half-brother  . Ovarian cancer Maternal Grandmother   . Cancer Neg Hx   . Heart disease Neg Hx   . Kidney disease Neg Hx     BP 114/76   Ht  6\' 2"  (1.88 m)   Wt (!) 330 lb (149.7 kg)   BMI 42.37 kg/m   Review of Systems: See HPI above.     Objective:  Physical Exam:  Gen: awake, alert, NAD, comfortable in exam room Pulm: breathing unlabored  Left knee:  Inspection: 1+ effusion with no warmth  irregular bony landmarks Range of motion is 10 to 105 degrees (pain at 105 degrees) Palpation: TTP at lateral and medial joint lines, and along patella   Knee is grossly stable to ligamentous exam, pain with valgus force. Pain with patellar compression.  Neurovascularly intact distally.  Standing exam: Collapse of longitudinal arch, partial collapse of transverse collapse. Left bunion    Assessment & Plan:  Left knee with severe osteoarthritis: - will ultimately require knee replacement for symptom improvement - wear compression sleeve and use recumbent bike for reduction in swelling  - provided insoles with medial scaphoid pad for additional cushion   Sherilyn Banker, MD [facility] PGY-3 I observed and examined the patient with the resident and agree with assessment and plan.  Note reviewed and modified by me. Stefanie Libel, MD

## 2018-05-11 NOTE — Telephone Encounter (Signed)
Ok to let pt know that it seems unlikely that his medications are causing his symptoms, but I can refer to GI - done

## 2018-05-11 NOTE — Assessment & Plan Note (Signed)
Given sports insoles with scaphoid pad and added heel cushion  This may take some pressure off knees

## 2018-05-11 NOTE — Patient Instructions (Addendum)
Ice and compress knee after work Use knee sleeve during the day Use recumbent bike as much as you can- can help reduce swelling and strengthen your quad Use insoles we gave you for cushion Use medicine as needed  Try to lose weight-- will take pressure off your knees

## 2018-05-11 NOTE — Telephone Encounter (Signed)
shirron to see below

## 2018-05-12 ENCOUNTER — Ambulatory Visit (INDEPENDENT_AMBULATORY_CARE_PROVIDER_SITE_OTHER): Payer: BLUE CROSS/BLUE SHIELD | Admitting: Psychology

## 2018-05-12 DIAGNOSIS — F4310 Post-traumatic stress disorder, unspecified: Secondary | ICD-10-CM

## 2018-06-01 ENCOUNTER — Other Ambulatory Visit: Payer: Self-pay | Admitting: Internal Medicine

## 2018-06-19 ENCOUNTER — Telehealth: Payer: Self-pay | Admitting: Podiatry

## 2018-06-19 NOTE — Telephone Encounter (Signed)
"   I am suppose to have injections in my knee, before the surgery, I was wondering if that would interup my surgery."

## 2018-06-19 NOTE — Telephone Encounter (Signed)
Depends on how close they are.  Large dose of steroid can slow healing on foot.  However, if he is getting a synvisc shot there is no steroid and that would be ok.

## 2018-06-20 NOTE — Telephone Encounter (Signed)
I informed pt of Dr. Stephenie Acres statement and pt states he will just wait until after his foot surgery to have the injections anyway.

## 2018-06-22 ENCOUNTER — Encounter: Payer: Self-pay | Admitting: Internal Medicine

## 2018-06-22 ENCOUNTER — Ambulatory Visit: Payer: BLUE CROSS/BLUE SHIELD | Admitting: Internal Medicine

## 2018-06-22 VITALS — BP 118/70 | HR 78 | Ht 74.0 in | Wt 349.1 lb

## 2018-06-22 DIAGNOSIS — K529 Noninfective gastroenteritis and colitis, unspecified: Secondary | ICD-10-CM | POA: Diagnosis not present

## 2018-06-22 NOTE — Progress Notes (Signed)
[name] 52 y.o. 07/25/1965 [id number] Referred by: Biagio Borg, MD  Assessment & Plan:   Encounter Diagnosis  Name Primary?  . Chronic diarrhea Yes    He has had diarrhea for about a year, cause is not clear.  It could be irritable bowel syndrome though medication side effect is possible as well.  Looking at his medication list the 2 most likely etiologies are olmesartan which can cause a sprue-like enteropathy that is immunologically mediated and may started anytime point during treatment.  Metformin is another possibility though I think less likely because he has been on a stable dose of that for some time that precedes the onset of diarrhea a year ago.  Do not see significant risk factor for an infectious diarrhea.  He has formed stools at times I think based upon the history I was able to obtain.  Plan to ask Dr. Jenny Reichmann to switch his olmesartan to a different medication.  After a month he still having diarrhea would need to consider additional investigation versus changing other medications.  Did explain upper endoscopy and biopsy of the duodenum is sort of the gold standard way to figure out if he has olmesartan enteropathy but this may be a more cost effective approach for him that we are choosing.  I have explained that IBS is a diagnosis of exclusion and that medication side effects are not irritable bowel syndrome.  I think he understands that.  He is to contact me if he has persistent symptoms after 1 month of new medication.  I appreciate the opportunity to care for this patient. CC: Biagio Borg, MD   Subjective:   Chief Complaint: Diarrhea  HPI Patient is a 52 year old African-American man known to me from prior screening colonoscopy in 2017 that was negative, who says he has had diarrhea for a year.  Also had H. pylori gastritis diagnosed with stool testing in 2013 and epigastric pain resolved after that was treated.  He believes it is related to [facility]".  He is unable to clearly outline all of the medications that the [name] has put him on and when they started.  Been on metformin and olmesartan for some time.  Though he says he does not inspect his stool he believes he has watery diarrhea most of the time and sometimes nocturnal.  After multiple bowel movements up to 8 in a day he will sometimes see scanty streaks of red blood on the toilet paper.  He does also believe he has formed stools at times.  No fevers, no abdominal pain.  Do not believe there is any antibiotics associated with the onset of his diarrhea, nor was there travel to the best of my knowledge.  He thinks that if he has IBS from medications he can get "connected for that at the [name]".   No Known Allergies Current Meds  Medication Sig  . buPROPion (WELLBUTRIN SR) 150 MG 12 hr tablet Take 150 mg by mouth daily.  . busPIRone (BUSPAR) 10 MG tablet Take 10 mg by mouth 3 (three) times daily.  . busPIRone (BUSPAR) 15 MG tablet Take 15 mg by mouth at bedtime.  . canagliflozin (INVOKANA) 100 MG TABS tablet Take 1 tablet (100 mg total) by mouth daily before breakfast.  . clomiPHENE (CLOMID) 50 MG tablet TAKE 1/2 TABLET BY MOUTH 3 TIMES A WEEK.  . cyclobenzaprine (FLEXERIL) 5 MG tablet Take 1 tablet (5 mg total) by mouth 3 (three)  times daily as needed for muscle spasms.  . diclofenac sodium (VOLTAREN) 1 % GEL Apply 4 g topically 4 (four) times daily as needed.  . DULoxetine (CYMBALTA) 30 MG capsule Take 30 mg by mouth daily.  [name] gabapentin (NEURONTIN) 300 MG capsule Take 300 mg by mouth every 8 (eight) hours as needed.  [name] HYDROcodone-acetaminophen (NORCO/VICODIN) 5-325 MG tablet Take 1 tablet by mouth every 6 (six) hours as needed for moderate pain.  . indomethacin (INDOCIN) 50 MG capsule TAKE ONE CAPSULE EVERY 6 HOURS AS NEEDED GOUT ONLY  . meloxicam (MOBIC) 15 MG tablet TAKE 1 TABLET (15 MG TOTAL) BY MOUTH DAILY.  . metFORMIN (GLUCOPHAGE) 500 MG tablet TAKE 2  TABLETS BY MOUTH TWICE A DAY  . metoprolol tartrate (LOPRESSOR) 25 MG tablet   . olmesartan (BENICAR) 20 MG tablet TAKE 1 TABLET (20 MG TOTAL) BY MOUTH DAILY.  [name] omeprazole (PRILOSEC) 40 MG capsule TAKE 1 CAPSULE BY MOUTH EVERY DAY  . prazosin (MINIPRESS) 2 MG capsule Take 1 capsule (2 mg total) by mouth at bedtime.  [name] QUEtiapine (SEROQUEL) 25 MG tablet Take 25 mg by mouth at bedtime.  . sildenafil (VIAGRA) 100 MG tablet Take 0.5-1 tablets (50-100 mg total) by mouth daily as needed for erectile dysfunction.  . Testosterone (ANDROGEL PUMP) 20.25 MG/ACT (1.62%) GEL 2 pumps on each shoulder every morning  . traZODone (DESYREL) 50 MG tablet Take 1 tablet (50 mg total) by mouth at bedtime. (Patient taking differently: Take 100 mg by mouth at bedtime. )  . vardenafil (LEVITRA) 20 MG tablet Take 1 tablet (20 mg total) by mouth daily as needed for erectile dysfunction.   Past Medical History:  Diagnosis Date  . Arthritis   . Depression 07/08/2015  . Diabetes mellitus ([place])   . Diabetes mellitus, type II ([place])   . DJD (degenerative joint disease)   . Gallstones   . GERD (gastroesophageal reflux disease)   . Gout   . HTN (hypertension)   . Hyperglycemia   . Hyperlipidemia   . Male hypogonadism 07/08/2015  . Obesity   . PONV (postoperative nausea and vomiting)    Past Surgical History:  Procedure Laterality Date  . Back Cyst Excision     x2  . CHOLECYSTECTOMY  08/11/2011   Procedure: LAPAROSCOPIC CHOLECYSTECTOMY WITH INTRAOPERATIVE CHOLANGIOGRAM;  Surgeon: Judieth Keens, DO;  Location: [facility];  Service: General;  Laterality: N/A;  Laparoscopic cholecystectomy with cholangiogram.  . COLONOSCOPY WITH PROPOFOL N/A 09/15/2015   Procedure: COLONOSCOPY WITH PROPOFOL;  Surgeon: Gatha Mayer, MD;  [facility];  Service: Endoscopy;  Laterality: N/A;  . JOINT REPLACEMENT  01/26/2011   right knee  . KNEE ARTHROSCOPY     x 2 left  . KNEE ARTHROSCOPY     x 2 right  . REPAIR KNEE  LIGAMENT     Left  . RIGHT FOOT     BONES STRAIGHTENED OUT   2006  . Shoulder Surgery, Reconstruction of Joint     Left x 3, Last 02/2007  . Tennis elbow surgery, Nerve entrapment     x2 right   Social History   Social History Narrative   Works - Dance movement psychotherapist, some care through the [name]   Married, second time?     Daughter 76 - [employer] doing well. Has her own place   Occasional alcohol, non-smoker no drugs   family history includes Colon cancer (age of onset: 75) in his brother; Ovarian cancer in his maternal  grandmother; Sarcoidosis in his mother.   Review of Systems See HPI  Objective:   Physical Exam BP 118/70   Pulse 78   Ht 6\' 2"  (1.88 m)   Wt (!) 349 lb 2 oz (158.4 kg)   BMI 44.82 kg/m  Obese no acute distress Eyes anicteric The abdomen is morbidly obese soft without significant tenderness organomegaly or mass  15 minutes time spent with patient > half in counseling coordination of care

## 2018-06-22 NOTE — Patient Instructions (Signed)
If you are age 53 or older, your body mass index should be between 23-30. Your Body mass index is 44.82 kg/m. If this is out of the aforementioned range listed, please consider follow up with your Primary Care Provider.  If you are age 27 or younger, your body mass index should be between 19-25. Your Body mass index is 44.82 kg/m. If this is out of the aformentioned range listed, please consider follow up with your Primary Care Provider.   We will talk with Dr Jenny Reichmann about changing your Losartan.   It was a pleasure to see you today!  Dr.Gessner

## 2018-06-23 ENCOUNTER — Telehealth: Payer: Self-pay | Admitting: Internal Medicine

## 2018-06-23 MED ORDER — AMLODIPINE BESYLATE 5 MG PO TABS: 5.0000 mg | ORAL_TABLET | Freq: Every day | ORAL | 3 refills | Status: DC

## 2018-06-23 NOTE — Telephone Encounter (Signed)
Ok to let pt know, Dr Carlean Purl feels the Benicar may be related to his GI usses, so we should stop and try amlodipine 5 mg per day for blood pressure instead - done erx

## 2018-06-23 NOTE — Telephone Encounter (Signed)
Called pt, LVM to call back to discuss. 

## 2018-06-23 NOTE — Telephone Encounter (Signed)
Pt has been informed.

## 2018-06-23 NOTE — Telephone Encounter (Signed)
-----   Message from Gatha Mayer, MD sent at 06/22/2018  6:01 PM EST ----- Regarding: Change olmesartan please I saw him today due to his complaints of chronic diarrhea.  He was convinced that it was "medications from the [name]" that are causing this.  Sorted through things, and I cannot really tell if that is the case but of the medications he is on olmesartan, at any point in treatment, can cause a sprue-like syndrome and cause diarrhea.  We talked about EGD versus changing that medication and have decided to change from olmesartan if you are able to accomplish that and give it a month to see what happens.  If he has persistent diarrhea then I will need to see him again or at least get a phone call and think about other changes versus diagnostic testing.  I left it with him that he should hear from you most likely by next week about changing his olmesartan to a different medication.   Thanks for your help  Glendell Docker

## 2018-06-27 ENCOUNTER — Other Ambulatory Visit: Payer: Self-pay | Admitting: Internal Medicine

## 2018-07-03 ENCOUNTER — Other Ambulatory Visit: Payer: Self-pay | Admitting: Podiatry

## 2018-07-03 DIAGNOSIS — M79676 Pain in unspecified toe(s): Secondary | ICD-10-CM

## 2018-07-03 MED ORDER — ONDANSETRON HCL 4 MG PO TABS: 4.0000 mg | ORAL_TABLET | Freq: Three times a day (TID) | ORAL | 0 refills | Status: AC | PRN

## 2018-07-03 MED ORDER — OXYCODONE-ACETAMINOPHEN 10-325 MG PO TABS: 1.0000 | ORAL_TABLET | Freq: Four times a day (QID) | ORAL | 0 refills | Status: AC | PRN

## 2018-07-03 MED ORDER — CEPHALEXIN 500 MG PO CAPS: 500.0000 mg | ORAL_CAPSULE | Freq: Three times a day (TID) | ORAL | 0 refills | Status: DC

## 2018-07-07 ENCOUNTER — Encounter: Payer: Self-pay | Admitting: Podiatry

## 2018-07-07 DIAGNOSIS — Z4889 Encounter for other specified surgical aftercare: Secondary | ICD-10-CM | POA: Diagnosis not present

## 2018-07-07 DIAGNOSIS — M2012 Hallux valgus (acquired), left foot: Secondary | ICD-10-CM

## 2018-07-13 ENCOUNTER — Encounter: Payer: BLUE CROSS/BLUE SHIELD | Admitting: *Deleted

## 2018-07-13 NOTE — Progress Notes (Signed)
Patient rescheduled 1st POV to January 7th. This encounter was created in error - please disregard.

## 2018-07-18 ENCOUNTER — Encounter

## 2018-07-18 ENCOUNTER — Ambulatory Visit (INDEPENDENT_AMBULATORY_CARE_PROVIDER_SITE_OTHER): Payer: BLUE CROSS/BLUE SHIELD | Admitting: Podiatry

## 2018-07-18 ENCOUNTER — Ambulatory Visit (INDEPENDENT_AMBULATORY_CARE_PROVIDER_SITE_OTHER): Payer: BLUE CROSS/BLUE SHIELD

## 2018-07-18 VITALS — Temp 96.7°F

## 2018-07-18 DIAGNOSIS — M2012 Hallux valgus (acquired), left foot: Secondary | ICD-10-CM

## 2018-07-18 MED ORDER — OXYCODONE-ACETAMINOPHEN 10-325 MG PO TABS: 1.0000 | ORAL_TABLET | Freq: Three times a day (TID) | ORAL | 0 refills | Status: DC | PRN

## 2018-07-19 NOTE — Progress Notes (Signed)
He presents today date of surgery 07/07/2018 status post Jake Michaelis bunionectomy of the left foot and removal of internal fixation with release of the first metatarsal phalangeal joint on the right foot.  He states my feet are quite painful but are doing okay.  Objective: Vital signs are stable he is alert oriented x3 there is no erythematous mild edema no cellulitis drainage or odor appears to be healing very nicely.  Is good range of motion of the toes and demonstrated range of motion techniques for him today.  No open lesions no signs of infection radiographs taken today do demonstrate a complete arthroplasty with a single silicone implant left foot and a normal joint right foot with removal of internal fixation first metatarsal right.  Assessment: Well-healing surgical foot.  Plan: Redressed both feet today dressed a compressive dressing continue to keep elevated and dry follow-up with me in about 2 weeks.

## 2018-07-25 ENCOUNTER — Ambulatory Visit (INDEPENDENT_AMBULATORY_CARE_PROVIDER_SITE_OTHER): Payer: BLUE CROSS/BLUE SHIELD | Admitting: Podiatry

## 2018-07-25 DIAGNOSIS — M2012 Hallux valgus (acquired), left foot: Secondary | ICD-10-CM

## 2018-07-26 ENCOUNTER — Ambulatory Visit (INDEPENDENT_AMBULATORY_CARE_PROVIDER_SITE_OTHER): Payer: BLUE CROSS/BLUE SHIELD

## 2018-07-26 DIAGNOSIS — M2012 Hallux valgus (acquired), left foot: Secondary | ICD-10-CM

## 2018-07-26 NOTE — Progress Notes (Signed)
He presents today date of surgery 07/07/2018 status post [name] bunion repair with implant left and removal of fixation first metatarsal phalangeal joint right foot with a release of that joint.  He states it still hurts but is not as bad as it was.  Denies fever chills nausea body muscle aches pains continues the cam walker on the left side Darco on the right.  No calf pain back pain chest pain shortness of breath.  Objective: Vital signs are stable he is alert oriented x3 dressed her dressing is removed once removed demonstrates sutures are intact margins well coapted the ends of the sutures were removed today.  He has good range of motion with moderate edema no erythema cellulitis drainage or odor.  Assessment: Well-healing surgical foot.  Plan: I encouraged range of motion of the metatarsophalangeal joints actively and passively bilaterally.  Placed him in Darco shoes bilaterally.  Instructed him that he may wear a regular shoe on the right foot once the swelling goes down and off to do so.  Otherwise I will follow-up with him in a couple of weeks for reevaluation.

## 2018-07-27 ENCOUNTER — Encounter: Payer: Self-pay | Admitting: Endocrinology

## 2018-07-27 ENCOUNTER — Ambulatory Visit (INDEPENDENT_AMBULATORY_CARE_PROVIDER_SITE_OTHER): Payer: BLUE CROSS/BLUE SHIELD | Admitting: Endocrinology

## 2018-07-27 VITALS — BP 108/72 | HR 84 | Ht 74.0 in | Wt 347.0 lb

## 2018-07-27 DIAGNOSIS — E291 Testicular hypofunction: Secondary | ICD-10-CM

## 2018-07-27 MED ORDER — TESTOSTERONE 20.25 MG/ACT (1.62%) TD GEL: TRANSDERMAL | 2 refills | Status: DC

## 2018-07-27 NOTE — Progress Notes (Signed)
Patient ID: [name], male   DOB: 23-Nov-1965, 53 y.o.   MRN: [id number]           Chief complaint: Follow-up of low testosterone  History of Present Illness  Hypogonadismwas diagnosed in 2012  He has had complaints offatigue, decreased motivation, decreased libido, and general feeling bad The symptoms started a few years ago and somewhat worse for couple of months before his visit in 7/19 No obvious causes of low testosterone were identified on his initial consultation In 2012 his free testosterone level was low normal at 47 with total 180  Repeat free testosterone was 8.0 done on 01/16/2018 in the morning at 9:30 AM  RECENT history: With using clomiphene he did not have adequate response of the testosterone level and also LH had gone above normal Also on his last visit he had started taking testosterone gel again on his own Overall had been feeling better with his energy level Recently has had some depression but otherwise no unusual fatigue, no complaints about libido  He has not had follow-up labs done Also in the last month or so because of foot surgery he decided to not use the testosterone gel on his own and started back only 2 days ago  His lab results showtestosterone levels:  Lab Results  Component Value Date   TESTOSTERONE 230.38 (L) 03/29/2018   TESTOSTERONE 358 01/16/2018   TESTOSTERONE 217.99 (L) 12/15/2017   TESTOSTERONE 243.76 (L) 09/16/2017    Prolactin level: 8.3  Baseline LH of 7.4  Lab Results  Component Value Date   LH 13.04 (H) 03/29/2018   Lab Results  Component Value Date   HGB 12.7 (L) 03/29/2018       Previous treatment: He had used AndroGel since 2016 from his PCP which he thinks he took for a few months, no more than 6 months He is unclear whether he felt subjectively better with this but may have had more energy He thinks he also had a prescription given by the [place] level:  He had a random 4 PM  cortisol level done because of the fatigue which was low normal at 2.6 Follow-up morning cortisol was in the normal range    DIABETES: He has had type 2 diabetes treated with metformin and Invokana  Diagnosis initially was in 2014 with an A1c of 10.9  Followed by PCP Recently appears to have gained weight  Wt Readings from Last 3 Encounters:  07/27/18 (!) 347 lb (157.4 kg)  06/22/18 (!) 349 lb 2 oz (158.4 kg)  05/11/18 (!) 330 lb (149.7 kg)    Lab Results  Component Value Date   HGBA1C 5.9 12/15/2017   HGBA1C 5.9 09/16/2017   HGBA1C 5.7 03/15/2017   Lab Results  Component Value Date   MICROALBUR <0.7 03/15/2017   LDLCALC 113 (H) 12/15/2017   CREATININE 0.87 01/16/2018       Allergies as of 07/27/2018   No Known Allergies     Medication List       Accurate as of July 27, 2018  4:00 PM. Always use your most recent med list.        amLODipine 5 MG tablet Commonly known as:  NORVASC Take 1 tablet (5 mg total) by mouth daily.   buPROPion 150 MG 12 hr tablet Commonly known as:  WELLBUTRIN SR Take 150 mg by mouth daily.   busPIRone 10 MG tablet Commonly known as:  BUSPAR Take 10 mg by mouth 3 (three)  times daily.   busPIRone 15 MG tablet Commonly known as:  BUSPAR Take 15 mg by mouth at bedtime.   canagliflozin 100 MG Tabs tablet Commonly known as:  INVOKANA Take 1 tablet (100 mg total) by mouth daily before breakfast.   cyclobenzaprine 5 MG tablet Commonly known as:  FLEXERIL Take 1 tablet (5 mg total) by mouth 3 (three) times daily as needed for muscle spasms.   diclofenac sodium 1 % Gel Commonly known as:  VOLTAREN Apply 4 g topically 4 (four) times daily as needed.   DULoxetine 30 MG capsule Commonly known as:  CYMBALTA Take 30 mg by mouth daily.   escitalopram 20 MG tablet Commonly known as:  LEXAPRO Take 1 tablet (20 mg total) by mouth daily.   gabapentin 300 MG capsule Commonly known as:  NEURONTIN Take 300 mg by mouth every 8  (eight) hours as needed.   HYDROcodone-acetaminophen 5-325 MG tablet Commonly known as:  NORCO/VICODIN Take 1 tablet by mouth every 6 (six) hours as needed for moderate pain.   indomethacin 50 MG capsule Commonly known as:  INDOCIN TAKE ONE CAPSULE EVERY 6 HOURS AS NEEDED GOUT ONLY   meloxicam 15 MG tablet Commonly known as:  MOBIC TAKE 1 TABLET (15 MG TOTAL) BY MOUTH DAILY.   metFORMIN 500 MG tablet Commonly known as:  GLUCOPHAGE TAKE 2 TABLETS BY MOUTH TWICE A DAY   metoprolol tartrate 25 MG tablet Commonly known as:  LOPRESSOR   omeprazole 40 MG capsule Commonly known as:  PRILOSEC TAKE 1 CAPSULE BY MOUTH EVERY DAY   ondansetron 4 MG tablet Commonly known as:  ZOFRAN Take 1 tablet (4 mg total) by mouth every 8 (eight) hours as needed for nausea or vomiting.   oxyCODONE-acetaminophen 10-325 MG tablet Commonly known as:  PERCOCET Take 1 tablet by mouth every 8 (eight) hours as needed for pain.   prazosin 2 MG capsule Commonly known as:  MINIPRESS Take 1 capsule (2 mg total) by mouth at bedtime.   QUEtiapine 25 MG tablet Commonly known as:  SEROQUEL Take 25 mg by mouth at bedtime.   sildenafil 100 MG tablet Commonly known as:  VIAGRA Take 0.5-1 tablets (50-100 mg total) by mouth daily as needed for erectile dysfunction.   Testosterone 20.25 MG/ACT (1.62%) Gel Commonly known as:  ANDROGEL PUMP 2 pumps on each shoulder every morning   traZODone 50 MG tablet Commonly known as:  DESYREL Take 1 tablet (50 mg total) by mouth at bedtime.   vardenafil 20 MG tablet Commonly known as:  LEVITRA Take 1 tablet (20 mg total) by mouth daily as needed for erectile dysfunction.       Allergies: No Known Allergies  Past Medical History:  Diagnosis Date  . Arthritis   . Depression 07/08/2015  . Diabetes mellitus ([place])   . Diabetes mellitus, type II ([place])   . DJD (degenerative joint disease)   . Gallstones   . GERD (gastroesophageal reflux disease)   . Gout   . HTN  (hypertension)   . Hyperglycemia   . Hyperlipidemia   . Male hypogonadism 07/08/2015  . Obesity   . PONV (postoperative nausea and vomiting)     Past Surgical History:  Procedure Laterality Date  . Back Cyst Excision     x2  . CHOLECYSTECTOMY  08/11/2011   Procedure: LAPAROSCOPIC CHOLECYSTECTOMY WITH INTRAOPERATIVE CHOLANGIOGRAM;  Surgeon: Judieth Keens, DO;  Location: [facility];  Service: General;  Laterality: N/A;  Laparoscopic cholecystectomy with cholangiogram.  . COLONOSCOPY  WITH PROPOFOL N/A 09/15/2015   Procedure: COLONOSCOPY WITH PROPOFOL;  Surgeon: Gatha Mayer, MD;  Location: [facility];  Service: Endoscopy;  Laterality: N/A;  . JOINT REPLACEMENT  01/26/2011   right knee  . KNEE ARTHROSCOPY     x 2 left  . KNEE ARTHROSCOPY     x 2 right  . REPAIR KNEE LIGAMENT     Left  . RIGHT FOOT     BONES STRAIGHTENED OUT   2006  . Shoulder Surgery, Reconstruction of Joint     Left x 3, Last 02/2007  . Tennis elbow surgery, Nerve entrapment     x2 right    Family History  Problem Relation Age of Onset  . Sarcoidosis Mother   . Colon cancer Brother 61       half-brother  . Ovarian cancer Maternal Grandmother   . Cancer Neg Hx   . Heart disease Neg Hx   . Kidney disease Neg Hx     Social History:  reports that he has never smoked. He has never used smokeless tobacco. He reports that he does not drink alcohol or use drugs.  Review of Systems    General Examination:   BP 108/72 (BP Location: Left Arm, Patient Position: Sitting, Cuff Size: Normal)   Pulse 84   Ht 6\' 2"  (1.88 m)   Wt (!) 347 lb (157.4 kg)   SpO2 98%   BMI 44.55 kg/m   Exam not indicated  Assessment/ Plan:  Hypogonadism, diagnosed around 2016  He had symptomatic hypogonadism in the past Appears to have hypogonadotropic hypogonadism from previous assessment  Although he may have had initial response to clomiphene this was not continued because of his last testosterone level being low and LH  level being above normal Subjectively has done better even with testosterone levels below 300 in the past  More recently he has not taken his testosterone gel regularly; also has been apparently not getting a full month supply with 150 g of AndroGel while using 4 pumps a day  He will need to use the testosterone gel daily for the next month and come back for labs In the meantime new prescription for 225 g of AndroGel per month has been sent   Again encouraged him to lose weight because of his metabolic syndrome and continued morbid obesity He needs to probably discuss being followed by nutritionist with his PCP  Elayne Snare 07/27/2018, 4:00 PM     Note: This office note was prepared with Dragon voice recognition system technology. Any transcriptional errors that result from this process are unintentional.

## 2018-07-27 NOTE — Patient Instructions (Signed)
Check lab in 1 month

## 2018-08-01 NOTE — Progress Notes (Signed)
Patient is here today with concern about his surgical incision opening up, he states that it has drained some as well.  He denies any increase in pain at this time.  Patient is status post Jake Michaelis bunion repair with implant left removal of fixation first metatarsal phalangeal joint right foot, date of surgery 07/07/2018.  Patient has dissolvable stitches in both feet, the left foot incision appears to have a minimal amount of gapping but this appears to be just involved the epidermal layers.  I did note a small amount of drainage on the bandage as well.  There is a moderate amount of edema, but no erythema, no purulent drainage, and he has good range of motion and minimal pain at this time.  Right foot incision is healing well and shows no symptoms of infection.  Applied skin prep around the surgical incision along with Steri-Strips.  I advised him to go back to wearing his boot for a week, keep the foot dry and leave the bandage intact.  We discussed importance of elevation, and compression, and staying in his boot for a week.  Advised him to follow back up next week with myself and Dr. Milinda Pointer to evaluate the incision.  He is to come in sooner with any acute symptom changes.

## 2018-08-03 ENCOUNTER — Ambulatory Visit (INDEPENDENT_AMBULATORY_CARE_PROVIDER_SITE_OTHER): Payer: Self-pay | Admitting: Podiatry

## 2018-08-03 ENCOUNTER — Ambulatory Visit (INDEPENDENT_AMBULATORY_CARE_PROVIDER_SITE_OTHER): Payer: BLUE CROSS/BLUE SHIELD

## 2018-08-03 DIAGNOSIS — M2012 Hallux valgus (acquired), left foot: Secondary | ICD-10-CM

## 2018-08-03 MED ORDER — DOXYCYCLINE HYCLATE 100 MG PO TABS: 100.0000 mg | ORAL_TABLET | Freq: Two times a day (BID) | ORAL | 0 refills | Status: DC

## 2018-08-03 NOTE — Progress Notes (Signed)
He presents today for follow-up of his surgical feet bilateral right foot he had removal of internal fixation and release of the first metatarsal phalangeal joint which is currently already back into a tennis shoe.  His left foot is giving him considerable grief doxy date of surgery 07/07/2018.  Objective: Vital signs are stable he is alert oriented x3.  Pulses are palpable.  Considerable edema to the left lower extremity no calf pain no swelling in the calf.  No swelling of the ankle.  Majority swelling just around the surgical site.  Is not warm to the touch but cannot rule out infection since he did have a mild dehiscence last week of the wound.  Currently it appears to be close and will see any open lesions or wounds at this point he has tenderness on range of motion of the first metatarsophalangeal joint which is the implant.  Assessment: Well-healing surgical foot right well-healed surgical foot left is moderate edema and pain left foot.  Plan: Discussed etiology pathology conservative therapies this point time he will start contrast baths also start him on doxycycline just to cover any bioburden that may have infiltrated while the wound was open.  #2 recommend that he consider physical therapy if not improved by next visit.  I will follow-up with him in 2 weeks

## 2018-08-08 ENCOUNTER — Other Ambulatory Visit: Payer: BLUE CROSS/BLUE SHIELD

## 2018-08-17 ENCOUNTER — Other Ambulatory Visit: Payer: BLUE CROSS/BLUE SHIELD

## 2018-08-17 ENCOUNTER — Ambulatory Visit (INDEPENDENT_AMBULATORY_CARE_PROVIDER_SITE_OTHER): Payer: Self-pay | Admitting: Podiatry

## 2018-08-17 ENCOUNTER — Ambulatory Visit (INDEPENDENT_AMBULATORY_CARE_PROVIDER_SITE_OTHER): Payer: BLUE CROSS/BLUE SHIELD

## 2018-08-17 DIAGNOSIS — M2012 Hallux valgus (acquired), left foot: Secondary | ICD-10-CM

## 2018-08-17 DIAGNOSIS — Z09 Encounter for follow-up examination after completed treatment for conditions other than malignant neoplasm: Secondary | ICD-10-CM | POA: Diagnosis not present

## 2018-08-17 NOTE — Progress Notes (Signed)
Presents today date of surgery 07/07/2018 status post [name] bunion implant with removal of fixation on the opposite foot.  He states that the implant part is still sore but the other foot is doing very well.  He would like to try to put a shoe on the right foot.  Objective: Vital signs are stable he is alert and oriented x3 much decrease in pain on range of motion of the first metatarsophalangeal joint left where the [name] was performed.  He has good range of motion but still considerably edematous.  Assessment: Well-healing surgical foot left and right radiographs confirm this.  Plan: Encouraged him to continue with the contrast baths and therapy I will follow-up with him in 2 weeks at which time if he is not progressed considerably then I will recommend formal physical therapy and write a prescription for Medrol Dosepak.

## 2018-08-22 ENCOUNTER — Other Ambulatory Visit: Payer: BLUE CROSS/BLUE SHIELD

## 2018-08-23 ENCOUNTER — Ambulatory Visit (INDEPENDENT_AMBULATORY_CARE_PROVIDER_SITE_OTHER): Payer: BLUE CROSS/BLUE SHIELD | Admitting: Licensed Clinical Social Worker

## 2018-08-23 DIAGNOSIS — F4310 Post-traumatic stress disorder, unspecified: Secondary | ICD-10-CM

## 2018-08-23 DIAGNOSIS — F332 Major depressive disorder, recurrent severe without psychotic features: Secondary | ICD-10-CM | POA: Diagnosis not present

## 2018-08-23 DIAGNOSIS — F411 Generalized anxiety disorder: Secondary | ICD-10-CM

## 2018-08-24 ENCOUNTER — Encounter (HOSPITAL_COMMUNITY): Payer: Self-pay | Admitting: Licensed Clinical Social Worker

## 2018-08-24 DIAGNOSIS — F332 Major depressive disorder, recurrent severe without psychotic features: Secondary | ICD-10-CM | POA: Insufficient documentation

## 2018-08-24 NOTE — Progress Notes (Signed)
Comprehensive Clinical Assessment (CCA) Note  08/24/2018 [name] [id number]  Visit Diagnosis:      ICD-10-CM   1. PTSD (post-traumatic stress disorder) F43.10   2. Severe episode of recurrent major depressive disorder, without psychotic features ([place]) F33.2   3. Generalized anxiety disorder F41.1       CCA Part One  Part One has been completed on paper by the patient.  (See scanned document in Chart Review)  CCA Part Two A  Intake/Chief Complaint:  CCA Intake With Chief Complaint CCA Part Two Date: 08/23/18 CCA Part Two Time: 1311 Chief Complaint/Presenting Problem: He had been getting services at the [name] but found they didn't give him enough time.  Was only seeing his therapist about once every 6 weeks.   Patients Currently Reported Symptoms/Problems: "My depression and anxiety are off the roof.  My anger is getting out of control too."  Has to remove himself from situations that trigger anger.  He has memory loss.  Can't remember what he has planned for the day.  His medication has made him gain a lot of weight.  Appetite varies between extremes.  Has a lot of trouble focusing on tasks.  Gets stuck on a lot of What ifs?  When he is around a lot of people he has anxiety.  Avoids going out because of this.  Continuously feels tense.       Individual's Strengths: "One thing I am good at is I try to be a good provider."  Enjoys spending time with his grandson (5).   Individual's Preferences: Wants to be functional in society and to have better relationships with his family Type of Services Patient Feels Are Needed: Therapy and medication management Initial Clinical Notes/Concerns: Diagnosed with sleep apnea.  Has a CPAP.  Had foot surgery at the end of December on both feet.  He is out on medical leave.  He has IBS.  Has to use the bathroom about 9 times a day.    Mental Health Symptoms Depression:  Depression: Change in energy/activity, Difficulty Concentrating, Fatigue, Hopelessness,  Increase/decrease in appetite, Irritability, Sleep (too much or little), Tearfulness, Worthlessness  Mania:  Mania: N/A  Anxiety:   Anxiety: Difficulty concentrating, Fatigue, Irritability, Restlessness, Sleep, Tension, Worrying  Psychosis:  Psychosis: N/A  Trauma:  Trauma: Difficulty staying/falling asleep, Irritability/anger, Re-experience of traumatic event, Avoids reminders of event, Detachment from others, Hypervigilance, Emotional numbing, Guilt/shame  Obsessions:  Obsessions: Absent  Compulsions:  Compulsions: N/A  Inattention:  Inattention: N/A  Hyperactivity/Impulsivity:  Hyperactivity/Impulsivity: N/A  Oppositional/Defiant Behaviors:  Oppositional/Defiant Behaviors: Easily annoyed, Angry  Borderline Personality:     Other Mood/Personality Symptoms:      Mental Status Exam Appearance and self-care  Stature:  Stature: Average  Weight:  Weight: Obese  Clothing:  Clothing: Casual  Grooming:  Grooming: Normal  Cosmetic use:  Cosmetic Use: None  Posture/gait:  Posture/Gait: Other (Comment)(Walking with a cane)  Motor activity:  Motor Activity: Not Remarkable  Sensorium  Attention:  Attention: Normal  Concentration:  Concentration: Normal  Orientation:  Orientation: X5  Recall/memory:     Affect and Mood  Affect:  Affect: Anxious  Mood:  Mood: Anxious  Relating  Eye contact:  Eye Contact: Normal  Facial expression:  Facial Expression: Responsive  Attitude toward examiner:  Attitude Toward Examiner: Cooperative  Thought and Language  Speech flow: Speech Flow: Normal  Thought content:  Thought Content: Appropriate to mood and circumstances  Preoccupation:     Hallucinations:  Organization:     Transport planner of Knowledge:     Intelligence:     Abstraction:     Judgement:  Judgement: Fair  Art therapist:  Reality Testing: Adequate  Insight:  Insight: Fair  Decision Making:  Decision Making: Vacilates  Social Functioning  Social Maturity:  Social  Maturity: Isolates  Social Judgement:  Social Judgement: Victimized  Stress  Stressors:  Stressors: Grief/losses, Illness(His best friend of 5 years died unexpectedly in 07-15-2023.)  Coping Ability:  Coping Ability: English as a second language teacher Deficits:     Supports:      Family and Psychosocial History: Family history Does patient have children?: Yes How many children?: 1 How is patient's relationship with their children?: Daughter, Paris (22) lives in [place].  Says the relationship "could be better."  She says he is hard to talk to.  "I try to talk to her every day."  Childhood History:  Childhood History By whom was/is the patient raised?: Both parents Additional childhood history information: Grew up in [place]. Description of patient's relationship with caregiver when they were a child: Good relationship with mom.  Relationship with dad was "fair"   Patient's description of current relationship with people who raised him/her: Both are deceased How were you disciplined when you got in trouble as a child/adolescent?: My mother never whipped me.  Dad was the disciplinarian.  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  How long has patient been employed?: 24 years Patient's job has been impacted by current illness: Yes Describe how patient's job has been impacted: Has missed work because of his anxiety and depression What is the longest time patient has a held a job?: 24 years  Where was the patient employed at that time?: current employer Did You Receive Any  Psychiatric Treatment/Services While in the [employer]?: No Are There Guns or Other Weapons in [place]?: No Types of Guns/Weapons: n/a Are These Psychologist, educational?: Yes(n/a)  Education: Education Last Grade Completed: 12 Did Teacher, adult education From [employer]?: Yes Did Physicist, medical?: No Did [place]?: No Did You Have Any Special Interests In School?: English and science Did You Have An Individualized Education Program (IIEP): No Did You Have Any Difficulty At School?: No  Religion: Religion/Spirituality Are You A Religious Person?: Yes What is Your Religious Affiliation?: Personal assistant: Leisure / Recreation Leisure and Hobbies: Watches sports.  Stays home most of the time.    Exercise/Diet: Exercise/Diet Do You Exercise?: No(Has chronic knee pain) Have You Gained or Lost A Significant Amount of Weight in the Past Six Months?: Yes-Gained Do You Follow a Special Diet?: No Do You Have Any Trouble Sleeping?: Yes Explanation of Sleeping Difficulties:   Has trouble falling and staying asleep.  Has nightmares.  Takes a sleep medication.    CCA Part Two C  Alcohol/Drug Use: Alcohol / Drug Use History of alcohol / drug use?: Yes(Reports he currently drinks alcohol about twice a week.  Admits his use had been daily and he had been using it to numb his emotions.)  CCA Part Three  ASAM's:  Six Dimensions of Multidimensional Assessment  Dimension 1:  Acute Intoxication and/or Withdrawal Potential:     Dimension 2:  Biomedical Conditions and Complications:     Dimension 3:  Emotional, Behavioral, or Cognitive Conditions and Complications:     Dimension 4:  Readiness to Change:     Dimension 5:  Relapse, Continued use, or Continued Problem Potential:     Dimension 6:  Recovery/Living Environment:      Substance use Disorder (SUD)    Social Function:  Social Functioning Social Maturity: Isolates Social  Judgement: Victimized  Stress:  Stress Stressors: Grief/losses, Illness(His best friend of 57 years died unexpectedly in July 09, 2023.) Coping Ability: Overwhelmed Patient Takes Medications The Way The Doctor Instructed?: Yes  Risk Assessment- Self-Harm Potential: Risk Assessment For Self-Harm Potential Thoughts of Self-Harm: Vague current thoughts Method: No plan Additional Comments for Self-Harm Potential: Denies any incidents of self-harm  Says he "came close" once or twice though  Risk Assessment -Dangerous to Others Potential: Risk Assessment For Dangerous to Others Potential Method: No Plan Availability of Means: No access or NA Notification Required: No need or identified person Additional Comments for Danger to Others Potential: Reports "If I get upset in a confrontation, the first thing I want to do is get rid of them.  There have been a lot of close calls."  About 10 years ago he would get into fights.    DSM5 Diagnoses: Patient Active Problem List   Diagnosis Date Noted  . Severe episode of recurrent major depressive disorder, without psychotic features ([place]) 08/24/2018  . Pes planus, flexible 05/11/2018  . Cough 04/03/2018  . Sudden right hearing loss 12/18/2017  . Fatigue 12/15/2017  . OSA on CPAP 09/16/2017  . Chronic pain 03/15/2017  . Lower back pain 09/07/2016  . Rash of neck 04/15/2016  . Mass of testicle 01/08/2016  . Family history of colon cancer   . Nightmares associated with chronic post-traumatic stress disorder 08/07/2015  . PTSD (post-traumatic stress disorder) 07/09/2015  . Dysthymia 07/09/2015  . Generalized anxiety disorder 07/09/2015  . Male hypogonadism 07/08/2015  . Depression 07/08/2015  . Morbid obesity ([place]) 08/06/2014  . Left knee pain 05/28/2014  . Acute gouty arthritis 12/04/2013  . Helicobacter pylori (H. pylori) infection 02/10/2012  . Total knee replacement status, right 11/17/2011  . Meralgia paresthetica 09/23/2011  . Diabetes ([place])  07/08/2011  . Hypogonadism male 07/08/2011  . Preventative health care 07/08/2011  . DJD (degenerative joint disease) of knee 04/19/2011  . Hyperlipidemia 03/15/2007  . OBESITY NOS 03/15/2007  . Essential hypertension 03/15/2007      Recommendations for Services/Supports/Treatments: Recommendations for Services/Supports/Treatments Recommendations For Services/Supports/Treatments: Individual Therapy, Medication Management    Garnette Scheuermann

## 2018-08-28 ENCOUNTER — Other Ambulatory Visit (INDEPENDENT_AMBULATORY_CARE_PROVIDER_SITE_OTHER): Payer: BLUE CROSS/BLUE SHIELD

## 2018-08-28 DIAGNOSIS — E291 Testicular hypofunction: Secondary | ICD-10-CM | POA: Diagnosis not present

## 2018-08-28 LAB — CBC
HCT: 41.8 % (ref 39.0–52.0)
Hemoglobin: 13.2 g/dL (ref 13.0–17.0)
MCHC: 31.7 g/dL (ref 30.0–36.0)
MCV: 81.1 fl (ref 78.0–100.0)
Platelets: 212 10*3/uL (ref 150.0–400.0)
RBC: 5.15 Mil/uL (ref 4.22–5.81)
RDW: 15.1 % (ref 11.5–15.5)
WBC: 7.3 10*3/uL (ref 4.0–10.5)

## 2018-08-29 ENCOUNTER — Other Ambulatory Visit: Payer: Self-pay

## 2018-08-29 LAB — TESTOSTERONE: Testosterone: 532.62 ng/dL (ref 300.00–890.00)

## 2018-08-31 ENCOUNTER — Ambulatory Visit (INDEPENDENT_AMBULATORY_CARE_PROVIDER_SITE_OTHER): Payer: BLUE CROSS/BLUE SHIELD | Admitting: Podiatry

## 2018-08-31 ENCOUNTER — Encounter: Payer: Self-pay | Admitting: Podiatry

## 2018-08-31 ENCOUNTER — Telehealth: Payer: Self-pay | Admitting: *Deleted

## 2018-08-31 ENCOUNTER — Ambulatory Visit (INDEPENDENT_AMBULATORY_CARE_PROVIDER_SITE_OTHER): Payer: BLUE CROSS/BLUE SHIELD

## 2018-08-31 DIAGNOSIS — M2012 Hallux valgus (acquired), left foot: Secondary | ICD-10-CM | POA: Diagnosis not present

## 2018-08-31 DIAGNOSIS — Z9889 Other specified postprocedural states: Secondary | ICD-10-CM

## 2018-08-31 NOTE — Telephone Encounter (Signed)
Delivered to [facility]. 

## 2018-08-31 NOTE — Progress Notes (Signed)
He presents today date of surgery 07/07/2018 status post Jake Michaelis bunion implant left and removal of fixation first right.  He states his blood sugars been doing good but my feet really started to bother me.  He states that is still doing a lot of swelling and he really cannot bend the toes very well.  Objective: Vital signs are stable he is alert oriented x3.  Pulses are strongly palpable.  Range of motion is limited on dorsiflexion plantarflexion left over right.  Moderate swelling with erythema to the first metatarsal phalangeal joint left.  Radiographs demonstrate well healing first metatarsophalangeal joint.  Assessment: Chronic postop pain date of surgery 07/07/2018.  Plan: Start him on Mobic and will send him for physical therapy.  It would probably be at least 6 to 8 weeks before he can return to work at this point.  It appears that he is regressing rather than progressing.

## 2018-08-31 NOTE — Telephone Encounter (Signed)
-----   Message from Rip Harbour, [facility] sent at 08/31/2018 11:33 AM EST ----- Regarding: PT [facility] - in house  S/p keller bunion implant left DOS 07-07-18  Increase ROM and decrease pain and swelling  Duration: 3 x week x 4 weeks

## 2018-09-06 ENCOUNTER — Other Ambulatory Visit: Payer: Self-pay | Admitting: Internal Medicine

## 2018-09-11 ENCOUNTER — Telehealth: Payer: Self-pay | Admitting: *Deleted

## 2018-09-11 MED ORDER — METHYLPREDNISOLONE 4 MG PO TBPK: ORAL_TABLET | ORAL | 0 refills | Status: DC

## 2018-09-11 MED ORDER — MELOXICAM 15 MG PO TABS: 15.0000 mg | ORAL_TABLET | Freq: Every day | ORAL | 3 refills | Status: DC

## 2018-09-11 NOTE — Telephone Encounter (Signed)
[facility] - [name] states pt says Dr. Milinda Pointer was to send in a steroid medication.

## 2018-09-11 NOTE — Telephone Encounter (Signed)
Send over a steroid pack (medrol dose pack) and the follow up with mobic 15mg  once daily.#30 with 3RF

## 2018-09-11 NOTE — Telephone Encounter (Signed)
I informed pt the medications had been sent to the pharmacy and he should begin the Medrol dose pack on a full day, and follow the directions on the card, and once completed then begin the Meloxicam. Pt states understanding and asked what the medications were for. I told him to fight inflammation.

## 2018-09-29 ENCOUNTER — Ambulatory Visit: Payer: BLUE CROSS/BLUE SHIELD | Admitting: Internal Medicine

## 2018-09-29 ENCOUNTER — Encounter: Payer: Self-pay | Admitting: Internal Medicine

## 2018-09-29 ENCOUNTER — Other Ambulatory Visit: Payer: Self-pay

## 2018-09-29 DIAGNOSIS — E119 Type 2 diabetes mellitus without complications: Secondary | ICD-10-CM | POA: Diagnosis not present

## 2018-09-29 DIAGNOSIS — N529 Male erectile dysfunction, unspecified: Secondary | ICD-10-CM

## 2018-09-29 DIAGNOSIS — K580 Irritable bowel syndrome with diarrhea: Secondary | ICD-10-CM

## 2018-09-29 DIAGNOSIS — I10 Essential (primary) hypertension: Secondary | ICD-10-CM

## 2018-09-29 DIAGNOSIS — Z23 Encounter for immunization: Secondary | ICD-10-CM

## 2018-09-29 DIAGNOSIS — E785 Hyperlipidemia, unspecified: Secondary | ICD-10-CM | POA: Diagnosis not present

## 2018-09-29 MED ORDER — TADALAFIL 20 MG PO TABS: 20.0000 mg | ORAL_TABLET | Freq: Every day | ORAL | 11 refills | Status: DC | PRN

## 2018-09-29 MED ORDER — HYOSCYAMINE SULFATE 0.125 MG SL SUBL: 0.1250 mg | SUBLINGUAL_TABLET | SUBLINGUAL | 5 refills | Status: DC | PRN

## 2018-09-29 NOTE — Patient Instructions (Signed)
You had the flu shot today  Please make a Nurse Visit appt to have the Prevnar 13 pneumonia shot in 2 weeks  Please take all new medication as prescribed - the hyoscyamine as needed for IBS  Please take all new medication as prescribed - the cialis  You will be contacted regarding the referral for: Bariatric Surgury  Please continue all other medications as before, and refills have been done if requested.  Please have the pharmacy call with any other refills you may need.  Please continue your efforts at being more active, low cholesterol diet, and weight control.  Please keep your appointments with your specialists as you may have planned  Please return in 6 months, or sooner if needed

## 2018-09-29 NOTE — Progress Notes (Signed)
Subjective:    Patient ID: [name], male    DOB: Oct 13, 1965, 53 y.o.   MRN: [id number]  HPI  Here for yearly f/u;  Overall doing ok;  Pt denies Chest pain, worsening SOB, DOE, wheezing, orthopnea, PND, worsening LE edema, palpitations, dizziness or syncope.  Pt denies neurological change such as new headache, facial or extremity weakness.  Pt denies polydipsia, polyuria, or low sugar symptoms. Pt states overall good compliance with treatment and medications, good tolerability, and has been trying to follow appropriate diet.  Pt denies worsening depressive symptoms, suicidal ideation or panic. No fever, night sweats, wt loss, loss of appetite, or other constitutional symptoms.  Pt states good ability with ADL's, has low fall risk, home safety reviewed and adequate, no other significant changes in hearing or vision, and only occasionally active with exercise. Due for flu shot and prevnar.   Wt Readings from Last 3 Encounters:  09/29/18 156 lb (70.8 kg)  07/27/18 (!) 347 lb (157.4 kg)  06/22/18 (!) 349 lb 2 oz (158.4 kg)  S/p bilat feet surgury dec 2019, getting PT, wearing shoes today for first time.  IBS persistence with 8 BM per day, no pain, fever, blood, asks for restart hyoscyamine.  Also with worsening ED symptoms over the past year, hard to maintain erections.  Also states conts to gain wt, now 356, despite trying to be more active and less calories, asks for bariatric referral. Past Medical History:  Diagnosis Date  . Arthritis   . Depression 07/08/2015  . Diabetes mellitus ([place])   . Diabetes mellitus, type II ([place])   . DJD (degenerative joint disease)   . Gallstones   . GERD (gastroesophageal reflux disease)   . Gout   . HTN (hypertension)   . Hyperglycemia   . Hyperlipidemia   . Male hypogonadism 07/08/2015  . Obesity   . PONV (postoperative nausea and vomiting)    Past Surgical History:  Procedure Laterality Date  . Back Cyst Excision     x2  . CHOLECYSTECTOMY  08/11/2011    Procedure: LAPAROSCOPIC CHOLECYSTECTOMY WITH INTRAOPERATIVE CHOLANGIOGRAM;  Surgeon: Judieth Keens, DO;  Location: [facility];  Service: General;  Laterality: N/A;  Laparoscopic cholecystectomy with cholangiogram.  . COLONOSCOPY WITH PROPOFOL N/A 09/15/2015   Procedure: COLONOSCOPY WITH PROPOFOL;  Surgeon: Gatha Mayer, MD;  Location: [facility];  Service: Endoscopy;  Laterality: N/A;  . JOINT REPLACEMENT  01/26/2011   right knee  . KNEE ARTHROSCOPY     x 2 left  . KNEE ARTHROSCOPY     x 2 right  . REPAIR KNEE LIGAMENT     Left  . RIGHT FOOT     BONES STRAIGHTENED OUT   2006  . Shoulder Surgery, Reconstruction of Joint     Left x 3, Last 02/2007  . Tennis elbow surgery, Nerve entrapment     x2 right    reports that he has never smoked. He has never used smokeless tobacco. He reports that he does not drink alcohol or use drugs. family history includes Colon cancer (age of onset: 78) in his brother; Ovarian cancer in his maternal grandmother; Sarcoidosis in his mother. No Known Allergies Current Outpatient Medications on File Prior to Visit  Medication Sig Dispense Refill  . amLODipine (NORVASC) 5 MG tablet Take 1 tablet (5 mg total) by mouth daily. 90 tablet 3  . buPROPion (WELLBUTRIN SR) 150 MG 12 hr tablet Take 150 mg by mouth daily.    . busPIRone (BUSPAR)  10 MG tablet Take 10 mg by mouth 3 (three) times daily.    . busPIRone (BUSPAR) 15 MG tablet Take 15 mg by mouth at bedtime.    . canagliflozin (INVOKANA) 100 MG TABS tablet Take 1 tablet (100 mg total) by mouth daily before breakfast. 90 tablet 3  . cyclobenzaprine (FLEXERIL) 5 MG tablet Take 1 tablet (5 mg total) by mouth 3 (three) times daily as needed for muscle spasms. 40 tablet 1  . diclofenac sodium (VOLTAREN) 1 % GEL Apply 4 g topically 4 (four) times daily as needed. 400 g 3  . DULoxetine (CYMBALTA) 30 MG capsule Take 30 mg by mouth daily.    [name] gabapentin (NEURONTIN) 300 MG capsule Take 300 mg by mouth every 8 (eight)  hours as needed.    [name] HYDROcodone-acetaminophen (NORCO/VICODIN) 5-325 MG tablet Take 1 tablet by mouth every 6 (six) hours as needed for moderate pain. 40 tablet 0  . indomethacin (INDOCIN) 50 MG capsule TAKE ONE CAPSULE EVERY 6 HOURS AS NEEDED GOUT ONLY 60 capsule 2  . meloxicam (MOBIC) 15 MG tablet TAKE 1 TABLET (15 MG TOTAL) BY MOUTH DAILY. 90 tablet 3  . meloxicam (MOBIC) 15 MG tablet Take 1 tablet (15 mg total) by mouth daily. 30 tablet 3  . metFORMIN (GLUCOPHAGE) 500 MG tablet TAKE 2 TABLETS BY MOUTH TWICE A DAY 180 tablet 1  . methylPREDNISolone (MEDROL) 4 MG TBPK tablet Take as directed. 21 tablet 0  . metoprolol tartrate (LOPRESSOR) 25 MG tablet     . omeprazole (PRILOSEC) 40 MG capsule TAKE 1 CAPSULE BY MOUTH EVERY DAY 90 capsule 1  . ondansetron (ZOFRAN) 4 MG tablet Take 1 tablet (4 mg total) by mouth every 8 (eight) hours as needed for nausea or vomiting. 20 tablet 0  . oxyCODONE-acetaminophen (PERCOCET) 10-325 MG tablet Take 1 tablet by mouth every 8 (eight) hours as needed for pain. 20 tablet 0  . prazosin (MINIPRESS) 2 MG capsule Take 1 capsule (2 mg total) by mouth at bedtime. 30 capsule 1  . QUEtiapine (SEROQUEL) 25 MG tablet Take 25 mg by mouth at bedtime.    . sildenafil (VIAGRA) 100 MG tablet Take 0.5-1 tablets (50-100 mg total) by mouth daily as needed for erectile dysfunction. 5 tablet 11  . Testosterone (ANDROGEL PUMP) 20.25 MG/ACT (1.62%) GEL 2 pumps on each shoulder every morning 225 g 2  . traZODone (DESYREL) 50 MG tablet Take 1 tablet (50 mg total) by mouth at bedtime. (Patient taking differently: Take 100 mg by mouth at bedtime. ) 30 tablet 1  . vardenafil (LEVITRA) 20 MG tablet Take 1 tablet (20 mg total) by mouth daily as needed for erectile dysfunction. 6 tablet 5  . escitalopram (LEXAPRO) 20 MG tablet Take 1 tablet (20 mg total) by mouth daily. 30 tablet 2   No current facility-administered medications on file prior to visit.    Review of Systems  Constitutional:  Negative for other unusual diaphoresis or sweats HENT: Negative for ear discharge or swelling Eyes: Negative for other worsening visual disturbances Respiratory: Negative for stridor or other swelling  Gastrointestinal: Negative for worsening distension or other blood Genitourinary: Negative for retention or other urinary change Musculoskeletal: Negative for other MSK pain or swelling Skin: Negative for color change or other new lesions Neurological: Negative for worsening tremors and other numbness  Psychiatric/Behavioral: Negative for worsening agitation or other fatigue All other system neg per pt    Objective:   Physical Exam BP 128/86   Pulse  91   Temp 98.3 F (36.8 C) (Oral)   Ht 6\' 2"  (1.88 m)   Wt (!) 356 lb (161.5 kg)   SpO2 97%   BMI 45.71 kg/m  VS noted,  Constitutional: Pt appears in NAD HENT: Head: NCAT.  Right Ear: External ear normal.  Left Ear: External ear normal.  Eyes: . Pupils are equal, round, and reactive to light. Conjunctivae and EOM are normal Nose: without d/c or deformity Neck: Neck supple. Gross normal ROM Cardiovascular: Normal rate and regular rhythm.   Pulmonary/Chest: Effort normal and breath sounds without rales or wheezing.  Abd:  Soft, NT, ND, + BS, no organomegaly Neurological: Pt is alert. At baseline orientation, motor grossly intact Skin: Skin is warm. No rashes, other new lesions, no LE edema Psychiatric: Pt behavior is normal without agitation , nervous mild to mod No other exam findings Lab Results  Component Value Date   WBC 7.3 08/28/2018   HGB 13.2 08/28/2018   HCT 41.8 08/28/2018   PLT 212.0 08/28/2018   GLUCOSE 103 (H) 01/16/2018   CHOL 165 12/15/2017   TRIG 61.0 12/15/2017   HDL 39.90 12/15/2017   LDLCALC 113 (H) 12/15/2017   ALT 24 01/16/2018   AST 18 01/16/2018   NA 139 01/16/2018   K 4.6 01/16/2018   CL 103 01/16/2018   CREATININE 0.87 01/16/2018   BUN 14 01/16/2018   CO2 28 01/16/2018   TSH 1.78 12/15/2017    PSA 0.28 03/15/2017   INR 0.96 05/14/2011   HGBA1C 5.9 12/15/2017   MICROALBUR <0.7 03/15/2017       Assessment & Plan:

## 2018-10-01 ENCOUNTER — Encounter: Payer: Self-pay | Admitting: Internal Medicine

## 2018-10-01 ENCOUNTER — Other Ambulatory Visit: Payer: Self-pay | Admitting: Internal Medicine

## 2018-10-01 DIAGNOSIS — N529 Male erectile dysfunction, unspecified: Secondary | ICD-10-CM | POA: Insufficient documentation

## 2018-10-01 DIAGNOSIS — K589 Irritable bowel syndrome without diarrhea: Secondary | ICD-10-CM | POA: Insufficient documentation

## 2018-10-01 NOTE — Assessment & Plan Note (Signed)
stable overall by history and exam, recent data reviewed with pt, and pt to continue medical treatment as before,  to f/u any worsening symptoms or concerns  

## 2018-10-01 NOTE — Assessment & Plan Note (Signed)
[facility] for hyoscyamine restart,  to f/u any worsening symptoms or concerns

## 2018-10-01 NOTE — Assessment & Plan Note (Signed)
[facility] for bariatric surgury referral

## 2018-10-01 NOTE — Assessment & Plan Note (Signed)
[facility] for bariatric surgury referral  Note:  Total time for pt hx, exam, review of record with pt in the room, determination of diagnoses and plan for further eval and tx is > 40 min, with over 50% spent in coordination and counseling of patient including the differential dx, tx, further evaluation and other management of morbid obesity, DM, HTN, HLD, IBS, ED

## 2018-10-01 NOTE — Assessment & Plan Note (Signed)
Ok for cialis prn,  to f/u any worsening symptoms or concerns  

## 2018-10-01 NOTE — Assessment & Plan Note (Signed)
stable overall by history and exam, recent data reviewed with pt, and pt to continue medical treatment as before,  to f/u any worsening symptoms or concerns,  

## 2018-10-03 ENCOUNTER — Ambulatory Visit (INDEPENDENT_AMBULATORY_CARE_PROVIDER_SITE_OTHER): Payer: BLUE CROSS/BLUE SHIELD

## 2018-10-03 ENCOUNTER — Encounter: Payer: Self-pay | Admitting: Podiatry

## 2018-10-03 ENCOUNTER — Ambulatory Visit (INDEPENDENT_AMBULATORY_CARE_PROVIDER_SITE_OTHER): Payer: BLUE CROSS/BLUE SHIELD | Admitting: Podiatry

## 2018-10-03 ENCOUNTER — Other Ambulatory Visit: Payer: Self-pay

## 2018-10-03 DIAGNOSIS — M7752 Other enthesopathy of left foot: Secondary | ICD-10-CM

## 2018-10-03 DIAGNOSIS — Z9889 Other specified postprocedural states: Secondary | ICD-10-CM

## 2018-10-03 DIAGNOSIS — M2012 Hallux valgus (acquired), left foot: Secondary | ICD-10-CM | POA: Diagnosis not present

## 2018-10-03 DIAGNOSIS — M7751 Other enthesopathy of right foot: Secondary | ICD-10-CM | POA: Diagnosis not present

## 2018-10-03 NOTE — Progress Notes (Signed)
He presents today date of surgery 07/07/2018 status post Jake Michaelis bunion implant on the left foot with removal of internal fixation first metatarsal phalangeal joint and release of the joint right foot.  States that he is doing quite well he is continuing to go through physical therapy.  States that the swelling is come down a lot with the steroid pack.  States that it still hurts on the bottom of the foot right here as she points to the sesamoids of the first metatarsal phalangeal joint left.  Objective: Vital signs are stable he is alert and oriented x3 he has good range of motion on first metatarsal phalangeal joint after moving it for a few minutes.  His range of motion really comes pretty nicely however he does have pain on direct palpation of the tibial sesamoid left.  Radiographs taken today demonstrate no acute injuries.  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  Assessment: Well-healing surgical foot right slowly healing surgical foot left with sesamoiditis and capsulitis.  Plan: At this point I am going to encourage him to continue physical therapy and range of motion exercises try to walk as normal as possible.  Also sent him to [facility] to have a new set of orthotics made today that will allow the first metatarsal phalangeal joint to drop off and have a pocket for the sesamoids to sit in.  This should help alleviate his symptoms however this will take about a month before the orthotic comes in and that he would need about a month or so to get back to his normal gait if this will work for him.  I will follow-up with him once his orthotics come in.  Until then he is to remain out of work

## 2018-10-24 ENCOUNTER — Other Ambulatory Visit: Payer: Self-pay

## 2018-10-24 ENCOUNTER — Encounter: Payer: Self-pay | Admitting: Podiatry

## 2018-10-24 ENCOUNTER — Ambulatory Visit (INDEPENDENT_AMBULATORY_CARE_PROVIDER_SITE_OTHER): Payer: BLUE CROSS/BLUE SHIELD | Admitting: Podiatry

## 2018-10-24 ENCOUNTER — Ambulatory Visit (INDEPENDENT_AMBULATORY_CARE_PROVIDER_SITE_OTHER): Payer: BLUE CROSS/BLUE SHIELD

## 2018-10-24 VITALS — Temp 97.3°F

## 2018-10-24 DIAGNOSIS — M778 Other enthesopathies, not elsewhere classified: Secondary | ICD-10-CM

## 2018-10-24 DIAGNOSIS — M2012 Hallux valgus (acquired), left foot: Secondary | ICD-10-CM

## 2018-10-24 DIAGNOSIS — Z9889 Other specified postprocedural states: Secondary | ICD-10-CM

## 2018-10-24 DIAGNOSIS — M779 Enthesopathy, unspecified: Secondary | ICD-10-CM | POA: Diagnosis not present

## 2018-10-24 DIAGNOSIS — M7751 Other enthesopathy of right foot: Secondary | ICD-10-CM

## 2018-10-24 NOTE — Progress Notes (Signed)
He presents today status post Keller bunionectomy left foot removal of internal fixation first metatarsal phalangeal joint right foot.  He states that have been going to physical therapy but both of them have just been killing me lately.  He denies any changes in his past medical history medications allergies surgery social history.  Denies any trauma to the feet denies any excessive activities.  Objective: Vital signs are stable he is alert and oriented x3.  Edema appears to be going down quite nicely however the scar tissue is very dense around the metatarsal phalangeal joint left.  His right first metatarsal phalangeal joint appears to be freely movable but tender on palpation and again the scar tissue surrounding not only the scar but the joint itself appears to be tight.  Radiographs taken today demonstrate an osseously mature individual with a Keller arthroplasty single silicone implant without grommets left and good position.  Right demonstrates some early osteoarthritic changes to the head of the first metatarsal phalangeal joint not seen on previous radiographs.  There is an area of lysis on the dorsal aspect of the head of the metatarsal.  Assessment: Well-healing surgical foot delay in pain reduction and increased activity.  Plan: At this point I am going to request that he continue physical therapy and that he continue out of work for another 6 to 8 weeks.  I will follow-up with him in 4 weeks to make sure he is improving.  I had considered providing him with a Medrol Dosepak but he took 1 about 2 weeks ago.

## 2018-11-01 ENCOUNTER — Encounter: Payer: Self-pay | Admitting: Podiatry

## 2018-11-02 ENCOUNTER — Ambulatory Visit: Payer: BLUE CROSS/BLUE SHIELD | Admitting: Podiatry

## 2018-11-02 ENCOUNTER — Ambulatory Visit: Payer: BLUE CROSS/BLUE SHIELD | Admitting: Orthotics

## 2018-11-02 ENCOUNTER — Other Ambulatory Visit: Payer: Self-pay

## 2018-11-02 DIAGNOSIS — M779 Enthesopathy, unspecified: Principal | ICD-10-CM

## 2018-11-02 DIAGNOSIS — Z9889 Other specified postprocedural states: Secondary | ICD-10-CM

## 2018-11-02 DIAGNOSIS — M2012 Hallux valgus (acquired), left foot: Secondary | ICD-10-CM

## 2018-11-02 DIAGNOSIS — M778 Other enthesopathies, not elsewhere classified: Secondary | ICD-10-CM

## 2018-11-02 NOTE — Progress Notes (Signed)
Patient came in today to pick up custom made foot orthotics.  The goals were accomplished and the patient reported no dissatisfaction with said orthotics.  Patient was advised of breakin period and how to report any issues. 

## 2018-11-23 ENCOUNTER — Other Ambulatory Visit: Payer: Self-pay

## 2018-11-23 ENCOUNTER — Ambulatory Visit (INDEPENDENT_AMBULATORY_CARE_PROVIDER_SITE_OTHER): Payer: BLUE CROSS/BLUE SHIELD | Admitting: Podiatry

## 2018-11-23 ENCOUNTER — Encounter: Payer: Self-pay | Admitting: Podiatry

## 2018-11-23 ENCOUNTER — Ambulatory Visit (INDEPENDENT_AMBULATORY_CARE_PROVIDER_SITE_OTHER): Payer: BLUE CROSS/BLUE SHIELD

## 2018-11-23 VITALS — Temp 97.0°F

## 2018-11-23 DIAGNOSIS — M2012 Hallux valgus (acquired), left foot: Secondary | ICD-10-CM

## 2018-11-23 DIAGNOSIS — Z9889 Other specified postprocedural states: Secondary | ICD-10-CM | POA: Diagnosis not present

## 2018-11-23 NOTE — Progress Notes (Signed)
He presents today date of surgery July 07, 2018 status post [name] bunion implant left he states that the right foot is doing great where the screw was removed but the left foot is still exquisitely painful I would have never done it if I do noted that it was good be this long to heal and is painful.  Objective: Vital signs are stable he is alert and oriented x3 he still has considerable swelling without warmth around the first metatarsophalangeal joint of the left foot.  Soft tissue appears to be in early mobilization stages where it is no longer adhesed deep however the deep adhesions are still intact.  Limited the range of motion on dorsiflexion and plantarflexion due to these adhesions.  Radiographs demonstrate well healing surgical foot still has considerable scar tissue around the first metatarsal phalangeal joint implant is in good position.  Assessment: Pain in limb secondary to postop adhesions around the first metatarsophalangeal joint status post Keller arthroplasty.  Plan: Discussed etiology pathology conservative surgical therapies at this point I would like his orthotics to be re-fabricated with a hard extension underneath the first metatarsal phalangeal joint such as a Morton's extension.  This should help alleviate some of his symptoms while he still recovering.  He will remain out of work and in therapy until this is resolved.

## 2018-11-24 ENCOUNTER — Other Ambulatory Visit: Payer: Self-pay | Admitting: Endocrinology

## 2018-11-24 DIAGNOSIS — E291 Testicular hypofunction: Secondary | ICD-10-CM

## 2018-11-24 DIAGNOSIS — E119 Type 2 diabetes mellitus without complications: Secondary | ICD-10-CM

## 2018-11-27 ENCOUNTER — Other Ambulatory Visit (INDEPENDENT_AMBULATORY_CARE_PROVIDER_SITE_OTHER): Payer: BLUE CROSS/BLUE SHIELD

## 2018-11-27 ENCOUNTER — Other Ambulatory Visit: Payer: Self-pay

## 2018-11-27 DIAGNOSIS — E119 Type 2 diabetes mellitus without complications: Secondary | ICD-10-CM

## 2018-11-27 DIAGNOSIS — M79676 Pain in unspecified toe(s): Secondary | ICD-10-CM

## 2018-11-27 LAB — COMPREHENSIVE METABOLIC PANEL
ALT: 38 U/L (ref 0–53)
AST: 20 U/L (ref 0–37)
Albumin: 4.3 g/dL (ref 3.5–5.2)
Alkaline Phosphatase: 52 U/L (ref 39–117)
BUN: 13 mg/dL (ref 6–23)
CO2: 28 mEq/L (ref 19–32)
Calcium: 9.2 mg/dL (ref 8.4–10.5)
Chloride: 102 mEq/L (ref 96–112)
Creatinine, Ser: 0.92 mg/dL (ref 0.40–1.50)
GFR: 103.74 mL/min (ref >60.00)
Glucose, Bld: 115 mg/dL — ABNORMAL HIGH (ref 70–99)
Potassium: 4.2 mEq/L (ref 3.5–5.1)
Sodium: 137 mEq/L (ref 135–145)
Total Bilirubin: 0.5 mg/dL (ref 0.2–1.2)
Total Protein: 7.6 g/dL (ref 6.0–8.3)

## 2018-11-28 ENCOUNTER — Other Ambulatory Visit: Payer: Self-pay

## 2018-11-28 ENCOUNTER — Ambulatory Visit: Payer: BLUE CROSS/BLUE SHIELD | Admitting: Orthotics

## 2018-11-28 DIAGNOSIS — M2142 Flat foot [pes planus] (acquired), left foot: Secondary | ICD-10-CM

## 2018-11-28 DIAGNOSIS — M2012 Hallux valgus (acquired), left foot: Secondary | ICD-10-CM

## 2018-11-28 DIAGNOSIS — M2141 Flat foot [pes planus] (acquired), right foot: Secondary | ICD-10-CM

## 2018-11-28 DIAGNOSIS — M778 Other enthesopathies, not elsewhere classified: Secondary | ICD-10-CM

## 2018-11-28 DIAGNOSIS — F332 Major depressive disorder, recurrent severe without psychotic features: Secondary | ICD-10-CM

## 2018-11-28 NOTE — Progress Notes (Signed)
Patient didn't bring f/o to be refurbished with Morton's extension..will bring back Thursday.

## 2018-11-29 ENCOUNTER — Other Ambulatory Visit: Payer: Self-pay | Admitting: Internal Medicine

## 2018-11-30 ENCOUNTER — Ambulatory Visit (INDEPENDENT_AMBULATORY_CARE_PROVIDER_SITE_OTHER): Payer: BLUE CROSS/BLUE SHIELD | Admitting: Endocrinology

## 2018-11-30 ENCOUNTER — Other Ambulatory Visit: Payer: Self-pay

## 2018-11-30 VITALS — BP 120/72 | HR 93 | Ht 74.0 in | Wt 360.0 lb

## 2018-11-30 DIAGNOSIS — E291 Testicular hypofunction: Secondary | ICD-10-CM | POA: Diagnosis not present

## 2018-11-30 DIAGNOSIS — E119 Type 2 diabetes mellitus without complications: Secondary | ICD-10-CM | POA: Diagnosis not present

## 2018-11-30 NOTE — Progress Notes (Signed)
Patient ID: [name], male   DOB: 1966-02-20, 53 y.o.   MRN: [id number]           Chief complaint: Follow-up of low testosterone  History of Present Illness  Hypogonadismwas diagnosed in 2012  He has had complaints offatigue, decreased motivation, decreased libido, and general feeling bad The symptoms started a few years ago and somewhat worse for couple of months before his visit in 7/19 No obvious causes of low testosterone were identified on his initial consultation In 2012 his free testosterone level was low normal at 47 with total 180  Repeat free testosterone was 8.0 done on 01/16/2018 in the morning at 9:30 AM  RECENT history: With using clomiphene he did not have adequate response of the testosterone level and also LH had gone above normal  After his visit in 7/19 he was back on AndroGel This was increased to 4 pumps a day  Overall had been feeling better with his energy level and currently not complaining of feeling fatigued, lack of motivation or libido Previously had been somewhat irregular with his testosterone gel including in 9/19 However after resuming regular treatment in 2/20 his testosterone level was significantly higher at 533  Since then he has been told to take 3 pumps a day of the AndroGel  He has not had follow-up labs completed Although he thinks he is concerned about his weight gain from the testosterone gel his weight has leveled off recently  His lab results showtestosterone levels as follows:  Lab Results  Component Value Date   TESTOSTERONE 532.62 08/28/2018   TESTOSTERONE 230.38 (L) 03/29/2018   TESTOSTERONE 358 01/16/2018   TESTOSTERONE 217.99 (L) 12/15/2017    Baseline prolactin level: 8.3  Baseline LH of 7.4  Lab Results  Component Value Date   LH 13.04 (H) 03/29/2018   Lab Results  Component Value Date   HGB 13.2 08/28/2018   DIABETES: He has had type 2 diabetes treated by his PCP with metformin and Invokana  Diagnosis  initially was in 2014 with an A1c of 10.9  Followed by PCP but has not had any recent A1c  Again has difficulty losing weight this year  Wt Readings from Last 3 Encounters:  11/30/18 (!) 360 lb (163.3 kg)  09/29/18 (!) 356 lb (161.5 kg)  07/27/18 (!) 347 lb (157.4 kg)    Lab Results  Component Value Date   HGBA1C 5.9 12/15/2017   HGBA1C 5.9 09/16/2017   HGBA1C 5.7 03/15/2017   Lab Results  Component Value Date   MICROALBUR <0.7 03/15/2017   LDLCALC 113 (H) 12/15/2017   CREATININE 0.92 11/27/2018       Allergies as of 11/30/2018   No Known Allergies     Medication List       Accurate as of Nov 30, 2018  4:05 PM. If you have any questions, ask your nurse or doctor.        amLODipine 5 MG tablet Commonly known as:  NORVASC Take 1 tablet (5 mg total) by mouth daily.   buPROPion 150 MG 12 hr tablet Commonly known as:  WELLBUTRIN SR Take 150 mg by mouth daily.   busPIRone 10 MG tablet Commonly known as:  BUSPAR Take 10 mg by mouth 3 (three) times daily.   busPIRone 15 MG tablet Commonly known as:  BUSPAR Take 15 mg by mouth at bedtime.   canagliflozin 100 MG Tabs tablet Commonly known as:  Invokana Take 1 tablet (100 mg total) by mouth daily before  breakfast.   cromolyn 4 % ophthalmic solution Commonly known as:  OPTICROM INSTILL 1 DROP INTO EACH EYE 4 TIMES DAILY AT REGULAR INTERVALS.   cyclobenzaprine 5 MG tablet Commonly known as:  FLEXERIL Take 1 tablet (5 mg total) by mouth 3 (three) times daily as needed for muscle spasms.   diclofenac sodium 1 % Gel Commonly known as:  Voltaren Apply 4 g topically 4 (four) times daily as needed.   dicyclomine 10 MG capsule Commonly known as:  BENTYL   DULoxetine 30 MG capsule Commonly known as:  CYMBALTA Take 30 mg by mouth daily.   escitalopram 20 MG tablet Commonly known as:  Lexapro Take 1 tablet (20 mg total) by mouth daily.   gabapentin 300 MG capsule Commonly known as:  NEURONTIN Take 300 mg by  mouth every 8 (eight) hours as needed.   HYDROcodone-acetaminophen 5-325 MG tablet Commonly known as:  NORCO/VICODIN Take 1 tablet by mouth every 6 (six) hours as needed for moderate pain.   hyoscyamine 0.125 MG SL tablet Commonly known as:  LEVSIN SL Place 1 tablet (0.125 mg total) under the tongue every 4 (four) hours as needed for up to 10 days.   indomethacin 50 MG capsule Commonly known as:  INDOCIN TAKE ONE CAPSULE EVERY 6 HOURS AS NEEDED GOUT ONLY   meloxicam 15 MG tablet Commonly known as:  MOBIC TAKE 1 TABLET (15 MG TOTAL) BY MOUTH DAILY.   meloxicam 15 MG tablet Commonly known as:  MOBIC Take 1 tablet (15 mg total) by mouth daily.   metFORMIN 500 MG tablet Commonly known as:  GLUCOPHAGE TAKE 2 TABLETS BY MOUTH TWICE A DAY   metoprolol tartrate 25 MG tablet Commonly known as:  LOPRESSOR   olmesartan 20 MG tablet Commonly known as:  BENICAR TAKE 1 TABLET (20 MG TOTAL) BY MOUTH DAILY.   omeprazole 40 MG capsule Commonly known as:  PRILOSEC TAKE 1 CAPSULE BY MOUTH EVERY DAY   ondansetron 4 MG tablet Commonly known as:  Zofran Take 1 tablet (4 mg total) by mouth every 8 (eight) hours as needed for nausea or vomiting.   oxyCODONE-acetaminophen 10-325 MG tablet Commonly known as:  PERCOCET Take 1 tablet by mouth every 8 (eight) hours as needed for pain.   prazosin 2 MG capsule Commonly known as:  Minipress Take 1 capsule (2 mg total) by mouth at bedtime.   QUEtiapine 25 MG tablet Commonly known as:  SEROQUEL Take 25 mg by mouth at bedtime.   sildenafil 100 MG tablet Commonly known as:  Viagra Take 0.5-1 tablets (50-100 mg total) by mouth daily as needed for erectile dysfunction.   tadalafil 20 MG tablet Commonly known as:  Cialis Take 1 tablet (20 mg total) by mouth daily as needed for up to 30 days for erectile dysfunction.   tadalafil 20 MG tablet Commonly known as:  CIALIS TAKE 1 TABLET (20 MG TOTAL) BY MOUTH DAILY AS NEEDED FOR UP TO 30 DAYS FOR  ERECTILE DYSFUNCTION.   Testosterone 20.25 MG/ACT (1.62%) Gel Commonly known as:  AndroGel Pump 2 pumps on each shoulder every morning   traZODone 50 MG tablet Commonly known as:  DESYREL Take 1 tablet (50 mg total) by mouth at bedtime. What changed:  how much to take   vardenafil 20 MG tablet Commonly known as:  Levitra Take 1 tablet (20 mg total) by mouth daily as needed for erectile dysfunction.   Xiidra 5 % Soln Generic drug:  Lifitegrast INSTILL 1 DROP INTO BOTH  EYES TWICE A DAY       Allergies: No Known Allergies  Past Medical History:  Diagnosis Date  . Arthritis   . Depression 07/08/2015  . Diabetes mellitus ([place])   . Diabetes mellitus, type II ([place])   . DJD (degenerative joint disease)   . Gallstones   . GERD (gastroesophageal reflux disease)   . Gout   . HTN (hypertension)   . Hyperglycemia   . Hyperlipidemia   . Male hypogonadism 07/08/2015  . Obesity   . PONV (postoperative nausea and vomiting)     Past Surgical History:  Procedure Laterality Date  . Back Cyst Excision     x2  . CHOLECYSTECTOMY  08/11/2011   Procedure: LAPAROSCOPIC CHOLECYSTECTOMY WITH INTRAOPERATIVE CHOLANGIOGRAM;  Surgeon: Judieth Keens, DO;  Location: [facility];  Service: General;  Laterality: N/A;  Laparoscopic cholecystectomy with cholangiogram.  . COLONOSCOPY WITH PROPOFOL N/A 09/15/2015   Procedure: COLONOSCOPY WITH PROPOFOL;  Surgeon: Gatha Mayer, MD;  Location: [facility];  Service: Endoscopy;  Laterality: N/A;  . JOINT REPLACEMENT  01/26/2011   right knee  . KNEE ARTHROSCOPY     x 2 left  . KNEE ARTHROSCOPY     x 2 right  . REPAIR KNEE LIGAMENT     Left  . RIGHT FOOT     BONES STRAIGHTENED OUT   2006  . Shoulder Surgery, Reconstruction of Joint     Left x 3, Last 02/2007  . Tennis elbow surgery, Nerve entrapment     x2 right    Family History  Problem Relation Age of Onset  . Sarcoidosis Mother   . Colon cancer Brother 57       half-brother  . Ovarian  cancer Maternal Grandmother   . Cancer Neg Hx   . Heart disease Neg Hx   . Kidney disease Neg Hx     Social History:  reports that he has never smoked. He has never used smokeless tobacco. He reports that he does not drink alcohol or use drugs.  Review of Systems   He takes meloxicam as needed for his knee pain  General Examination:   BP 120/72 (BP Location: Left Arm, Patient Position: Sitting, Cuff Size: Normal)   Pulse 93   Ht 6\' 2"  (1.88 m)   Wt (!) 360 lb (163.3 kg)   SpO2 97%   BMI 46.22 kg/m      Assessment/ Plan:  Hypogonadism, diagnosed around 2016  He had symptomatic hypogonadotropic hypogonadism in the past  He has been treated with testosterone gel, did not respond adequately to clomiphene  He has been fairly consistent with his AndroGel recently and since his last visit is applying 2 pumps on 1 arm and 1 on the other Subjectively feeling fairly good with his energy level  Testosterone level to be checked today as it was not drawn by the lab  DIABETES: Has not had any follow-up A1c and this will also be drawn today and forwarded to PCP Consider consultation with dietitian and efforts to lose weight including possibly adding a GLP-1 drug  Elayne Snare 11/30/2018, 4:05 PM     Note: This office note was prepared with Dragon voice recognition system technology. Any transcriptional errors that result from this process are unintentional.

## 2018-12-01 ENCOUNTER — Encounter: Payer: Self-pay | Admitting: Endocrinology

## 2018-12-01 LAB — HEMOGLOBIN A1C: Hgb A1c MFr Bld: 6.7 % — ABNORMAL HIGH (ref 4.6–6.5)

## 2018-12-01 LAB — CBC
HCT: 41.5 % (ref 39.0–52.0)
Hemoglobin: 13.6 g/dL (ref 13.0–17.0)
MCHC: 32.7 g/dL (ref 30.0–36.0)
MCV: 82.6 fl (ref 78.0–100.0)
Platelets: 206 10*3/uL (ref 150.0–400.0)
RBC: 5.03 Mil/uL (ref 4.22–5.81)
RDW: 14.6 % (ref 11.5–15.5)
WBC: 6.8 10*3/uL (ref 4.0–10.5)

## 2018-12-01 LAB — TESTOSTERONE: Testosterone: 180.49 ng/dL — ABNORMAL LOW (ref 300.00–890.00)

## 2018-12-05 ENCOUNTER — Other Ambulatory Visit: Payer: Self-pay

## 2018-12-05 MED ORDER — DICLOFENAC SODIUM 1 % TD GEL: 4.0000 g | Freq: Four times a day (QID) | TRANSDERMAL | 1 refills | Status: AC | PRN

## 2018-12-19 ENCOUNTER — Telehealth: Payer: Self-pay

## 2018-12-19 MED ORDER — FLUTICASONE PROPIONATE 50 MCG/ACT NA SUSP: 2.0000 | Freq: Every day | NASAL | 6 refills | Status: DC

## 2018-12-19 NOTE — Addendum Note (Signed)
Addended by: Biagio Borg on: 12/19/2018 01:05 PM   Modules accepted: Orders

## 2018-12-19 NOTE — Telephone Encounter (Signed)
Done erx 

## 2018-12-19 NOTE — Telephone Encounter (Signed)
Pt is requesting generic flonase but I do not see med listed in the pt's chart.

## 2018-12-21 ENCOUNTER — Ambulatory Visit: Payer: BC Managed Care – PPO | Admitting: Orthotics

## 2018-12-21 ENCOUNTER — Other Ambulatory Visit: Payer: Self-pay

## 2018-12-21 DIAGNOSIS — M778 Other enthesopathies, not elsewhere classified: Secondary | ICD-10-CM

## 2018-12-21 DIAGNOSIS — M2141 Flat foot [pes planus] (acquired), right foot: Secondary | ICD-10-CM

## 2018-12-21 DIAGNOSIS — M2012 Hallux valgus (acquired), left foot: Secondary | ICD-10-CM

## 2018-12-21 NOTE — Progress Notes (Signed)
Picked up corrected f/o (rigid morton extension left)

## 2018-12-27 ENCOUNTER — Other Ambulatory Visit: Payer: Self-pay | Admitting: Internal Medicine

## 2019-01-04 ENCOUNTER — Encounter: Payer: Self-pay | Admitting: Podiatry

## 2019-01-04 ENCOUNTER — Ambulatory Visit (INDEPENDENT_AMBULATORY_CARE_PROVIDER_SITE_OTHER): Payer: BC Managed Care – PPO | Admitting: Podiatry

## 2019-01-04 ENCOUNTER — Other Ambulatory Visit: Payer: Self-pay

## 2019-01-04 VITALS — Temp 97.5°F

## 2019-01-04 DIAGNOSIS — M2012 Hallux valgus (acquired), left foot: Secondary | ICD-10-CM

## 2019-01-04 DIAGNOSIS — Z9889 Other specified postprocedural states: Secondary | ICD-10-CM

## 2019-01-04 NOTE — Progress Notes (Signed)
He presents today date of surgery 07/07/2018 status post [name] bunionectomy with implant.  He states that he thinks he is finally started to turn the corner as far as the healing of the foot.  He states that it still swells and I have tried my still toed boots on yet.  Objective: Vital signs are stable he is alert and oriented x3 no change in his past medical history medications allergies no fever chills nausea vomiting muscle aches or pains.  He has great range of motion of the first metatarsal phalangeal joint of the left foot.  Much decrease in edema from previous evaluations.  He has mild tenderness on palpation of the joint itself.  Radiographs taken today demonstrate a well-healing Keller arthroplasty with a single silicone implant.   Assessment: Well-healing surgical foot left and right.  Plan: At this point I am going to allow him to get the swelling to come down a little bit more so he does not have to purchase a new pair of steel toed boots I am going to recommend that he try his shoes and indoor boots only in the house and walk with his work shoes or boots on for the next month.  He will continue daily physical therapy and I will follow-up with him in 1 month.  We will try to have him back to work the first week of August

## 2019-01-30 ENCOUNTER — Other Ambulatory Visit: Payer: BLUE CROSS/BLUE SHIELD

## 2019-02-02 ENCOUNTER — Ambulatory Visit: Payer: BLUE CROSS/BLUE SHIELD | Admitting: Endocrinology

## 2019-02-06 ENCOUNTER — Encounter: Payer: Self-pay | Admitting: Podiatry

## 2019-02-06 ENCOUNTER — Other Ambulatory Visit: Payer: Self-pay

## 2019-02-06 ENCOUNTER — Ambulatory Visit (INDEPENDENT_AMBULATORY_CARE_PROVIDER_SITE_OTHER): Payer: BC Managed Care – PPO

## 2019-02-06 ENCOUNTER — Ambulatory Visit (INDEPENDENT_AMBULATORY_CARE_PROVIDER_SITE_OTHER): Payer: BC Managed Care – PPO | Admitting: Podiatry

## 2019-02-06 VITALS — Temp 97.1°F

## 2019-02-06 DIAGNOSIS — M7750 Other enthesopathy of unspecified foot: Secondary | ICD-10-CM

## 2019-02-06 DIAGNOSIS — M7751 Other enthesopathy of right foot: Secondary | ICD-10-CM | POA: Diagnosis not present

## 2019-02-06 MED ORDER — CELECOXIB 200 MG PO CAPS: 200.0000 mg | ORAL_CAPSULE | Freq: Two times a day (BID) | ORAL | 2 refills | Status: DC

## 2019-02-06 NOTE — Progress Notes (Signed)
He presents today with chief complaint of pain to the his lateral ankle right.  He states that this left big toe is still undergoing therapy and just does not seem to be getting as good as I wanted to be states that I can even play with my grandchildren anymore I do still noted that I can even go back to work I was hoping that I would be able to go back to work but with COVID 19 in all of the other factors surrounding it I do not know that I will be able to go back because of anxiety pain in my legs pain in my feet.  Objective: Vital signs are stable he is alert and oriented x3.  Pulses are palpable.  Seems to be a little bit depressed today.  Does not state any other problems no chest pain or shortness of breath.  Good range of motion first metatarsophalangeal joint of the right foot left foot is still stiff and tender.  He has pain on his right ankle with no swelling no erythema cellulitis drainage odor he does have pain on palpation of the sinus tarsi.  Radiographs taken of the right ankle today do not demonstrate any type of osseous abnormalities other than some soft tissue swelling in the sinus tarsi area.  Assessment: Slowly resolving foot pain lateral ankle pain is most prevalent at this point.  Plan: Discussed etiology pathology conservative versus surgical therapies at this point he would like to try oral therapy and continue physical therapy.  We will continue physical therapy for him for another 2 months and I will go ahead and put him on Celebrex 200 mg 1 p.o. twice daily but discontinue his meloxicam.  After his pain is under control he will drop down to 200 mg once a day.

## 2019-02-22 ENCOUNTER — Other Ambulatory Visit: Payer: Self-pay | Admitting: Endocrinology

## 2019-02-22 NOTE — Telephone Encounter (Signed)
Please refill if appropriate

## 2019-03-05 ENCOUNTER — Other Ambulatory Visit: Payer: Self-pay

## 2019-03-05 ENCOUNTER — Telehealth: Payer: Self-pay | Admitting: Endocrinology

## 2019-03-05 ENCOUNTER — Other Ambulatory Visit: Payer: Self-pay | Admitting: Endocrinology

## 2019-03-05 ENCOUNTER — Other Ambulatory Visit (INDEPENDENT_AMBULATORY_CARE_PROVIDER_SITE_OTHER): Payer: BC Managed Care – PPO

## 2019-03-05 DIAGNOSIS — E291 Testicular hypofunction: Secondary | ICD-10-CM

## 2019-03-05 DIAGNOSIS — E119 Type 2 diabetes mellitus without complications: Secondary | ICD-10-CM | POA: Diagnosis not present

## 2019-03-05 LAB — CBC
HCT: 41 % (ref 39.0–52.0)
Hemoglobin: 12.9 g/dL — ABNORMAL LOW (ref 13.0–17.0)
MCHC: 31.5 g/dL (ref 30.0–36.0)
MCV: 83.4 fl (ref 78.0–100.0)
Platelets: 206 10*3/uL (ref 150.0–400.0)
RBC: 4.92 Mil/uL (ref 4.22–5.81)
RDW: 14.4 % (ref 11.5–15.5)
WBC: 5.5 10*3/uL (ref 4.0–10.5)

## 2019-03-05 LAB — HEMOGLOBIN A1C: Hgb A1c MFr Bld: 6.5 % (ref 4.6–6.5)

## 2019-03-05 LAB — GLUCOSE, RANDOM: Glucose, Bld: 123 mg/dL — ABNORMAL HIGH (ref 70–99)

## 2019-03-05 MED ORDER — TESTOSTERONE 20.25 MG/ACT (1.62%) TD GEL: TRANSDERMAL | 1 refills | Status: DC

## 2019-03-05 NOTE — Telephone Encounter (Signed)
MEDICATION: Testosterone 20.25 MG/ACT (1.62%) GEL  PHARMACY:  [id number] - [place], [place] - [address]  IS THIS A 90 DAY SUPPLY :   IS PATIENT OUT OF MEDICATION: YES  IF NOT; HOW MUCH IS LEFT:   LAST APPOINTMENT DATE: @8 /13/2020  NEXT APPOINTMENT DATE:@8 /26/2020  DO WE HAVE YOUR PERMISSION TO LEAVE A DETAILED MESSAGE:  OTHER COMMENTS:    **Let patient know to contact pharmacy at the end of the day to make sure medication is ready. **  ** Please notify patient to allow 48-72 hours to process**  **Encourage patient to contact the pharmacy for refills or they can request refills through [facility]**

## 2019-03-05 NOTE — Telephone Encounter (Signed)
Please refill is approriate

## 2019-03-06 LAB — TESTOSTERONE: Testosterone: 164.72 ng/dL — ABNORMAL LOW (ref 300.00–890.00)

## 2019-03-07 ENCOUNTER — Ambulatory Visit (INDEPENDENT_AMBULATORY_CARE_PROVIDER_SITE_OTHER): Payer: BC Managed Care – PPO | Admitting: Endocrinology

## 2019-03-07 ENCOUNTER — Encounter: Payer: Self-pay | Admitting: Endocrinology

## 2019-03-07 ENCOUNTER — Other Ambulatory Visit: Payer: Self-pay

## 2019-03-07 DIAGNOSIS — E291 Testicular hypofunction: Secondary | ICD-10-CM | POA: Diagnosis not present

## 2019-03-07 MED ORDER — TESTOSTERONE 20.25 MG/ACT (1.62%) TD GEL: TRANSDERMAL | 1 refills | Status: DC

## 2019-03-07 NOTE — Progress Notes (Signed)
Patient ID: [name], male   DOB: June 02, 1966, 54 y.o.   MRN: [id number]         Today's office visit was provided via telemedicine using video technique The patient was explained the limitations of evaluation and management by telemedicine and the availability of in person appointments.  The patient understood the limitations and agreed to proceed. Patient also understood that the telehealth visit is billable. . Location of the patient: Patient's home . Location of the provider: Physician office Only the patient and myself were participating in the encounter     Chief complaint: Follow-up of low testosterone  History of Present Illness  Hypogonadismwas diagnosed in 2012  He has had complaints offatigue, decreased motivation, decreased libido, and general feeling bad The symptoms started a few years ago and somewhat worse for couple of months before his visit in 7/19 No obvious causes of low testosterone were identified on his initial consultation In 2012 his free testosterone level was low normal at 47 with total 180  Repeat free testosterone was 8.0 done on 01/16/2018 in the morning at 9:30 AM  RECENT history: With using clomiphene he did not have adequate response of the testosterone level and also LH had gone above normal  After his visit in 7/19 he was back on AndroGel This was increased to 4 pumps a day  Overall had been feeling better with his energy level and currently not complaining of feeling fatigued, lack of motivation or libido Previously had been somewhat irregular with his testosterone gel including in 9/19 However after resuming regular treatment in 2/20 his testosterone level was significantly higher at 533  Subsequently he has not had adequate levels Now taking 2 pumps on each arm of the AndroGel He is applying this before getting dressed and is allowing the gel to get dry, he is applying the gel directly on his shoulders as directed He thinks that because  of not getting enough medication on his prescription he may be running out and he did not have any unusual to use for 2 days before his labs  His energy level is still pretty good and does not complain of fatigue or lack of motivation  His lab results showtestosterone levels low, recent level low again     Lab Results  Component Value Date   TESTOSTERONE 164.72 (L) 03/05/2019   TESTOSTERONE 180.49 (L) 11/30/2018   TESTOSTERONE 532.62 08/28/2018   TESTOSTERONE 230.38 (L) 03/29/2018    Baseline prolactin level: 8.3  Baseline LH of 7.4  Lab Results  Component Value Date   LH 13.04 (H) 03/29/2018   Lab Results  Component Value Date   HGB 12.9 (L) 03/05/2019   DIABETES: He has had type 2 diabetes treated by his PCP with metformin and Invokana  Diagnosis initially was in 2014 with an A1c of 10.9  Followed by PCP but has not had any visits with him  He checks his blood sugars only sporadically  Wt Readings from Last 3 Encounters:  11/30/18 (!) 360 lb (163.3 kg)  09/29/18 (!) 356 lb (161.5 kg)  07/27/18 (!) 347 lb (157.4 kg)    Lab Results  Component Value Date   HGBA1C 6.5 03/05/2019   HGBA1C 6.7 (H) 11/30/2018   HGBA1C 5.9 12/15/2017   Lab Results  Component Value Date   MICROALBUR <0.7 03/15/2017   LDLCALC 113 (H) 12/15/2017   CREATININE 0.92 11/27/2018       Allergies as of 03/07/2019   No Known Allergies  Medication List       Accurate as of March 07, 2019  3:00 PM. If you have any questions, ask your nurse or doctor.        STOP taking these medications   canagliflozin 100 MG Tabs tablet Commonly known as: Invokana Stopped by: Elayne Snare, MD     TAKE these medications   amLODipine 5 MG tablet Commonly known as: NORVASC Take 1 tablet (5 mg total) by mouth daily.   buPROPion 150 MG 12 hr tablet Commonly known as: WELLBUTRIN SR Take 150 mg by mouth daily.   busPIRone 10 MG tablet Commonly known as: BUSPAR Take 10 mg by mouth 3  (three) times daily.   busPIRone 15 MG tablet Commonly known as: BUSPAR Take 15 mg by mouth at bedtime.   celecoxib 200 MG capsule Commonly known as: CeleBREX Take 1 capsule (200 mg total) by mouth 2 (two) times daily.   cromolyn 4 % ophthalmic solution Commonly known as: OPTICROM INSTILL 1 DROP INTO EACH EYE 4 TIMES DAILY AT REGULAR INTERVALS.   cyclobenzaprine 5 MG tablet Commonly known as: FLEXERIL Take 1 tablet (5 mg total) by mouth 3 (three) times daily as needed for muscle spasms.   diclofenac sodium 1 % Gel Commonly known as: Voltaren Apply 4 g topically 4 (four) times daily as needed.   dicyclomine 10 MG capsule Commonly known as: BENTYL   DULoxetine 30 MG capsule Commonly known as: CYMBALTA Take 30 mg by mouth daily.   escitalopram 20 MG tablet Commonly known as: Lexapro Take 1 tablet (20 mg total) by mouth daily.   fluticasone 50 MCG/ACT nasal spray Commonly known as: FLONASE Place 2 sprays into both nostrils daily.   gabapentin 300 MG capsule Commonly known as: NEURONTIN Take 300 mg by mouth every 8 (eight) hours as needed.   HYDROcodone-acetaminophen 5-325 MG tablet Commonly known as: NORCO/VICODIN Take 1 tablet by mouth every 6 (six) hours as needed for moderate pain.   hyoscyamine 0.125 MG SL tablet Commonly known as: LEVSIN SL Place 1 tablet (0.125 mg total) under the tongue every 4 (four) hours as needed for up to 10 days.   indomethacin 50 MG capsule Commonly known as: INDOCIN TAKE ONE CAPSULE EVERY 6 HOURS AS NEEDED GOUT ONLY   meloxicam 15 MG tablet Commonly known as: MOBIC TAKE 1 TABLET (15 MG TOTAL) BY MOUTH DAILY.   meloxicam 15 MG tablet Commonly known as: MOBIC Take 1 tablet (15 mg total) by mouth daily.   metFORMIN 500 MG tablet Commonly known as: GLUCOPHAGE TAKE 2 TABLETS BY MOUTH TWICE A DAY   metoprolol tartrate 25 MG tablet Commonly known as: LOPRESSOR   olmesartan 20 MG tablet Commonly known as: BENICAR TAKE 1 TABLET  (20 MG TOTAL) BY MOUTH DAILY.   omeprazole 40 MG capsule Commonly known as: PRILOSEC TAKE 1 CAPSULE BY MOUTH EVERY DAY   ondansetron 4 MG tablet Commonly known as: Zofran Take 1 tablet (4 mg total) by mouth every 8 (eight) hours as needed for nausea or vomiting.   oxyCODONE-acetaminophen 10-325 MG tablet Commonly known as: PERCOCET Take 1 tablet by mouth every 8 (eight) hours as needed for pain.   prazosin 2 MG capsule Commonly known as: Minipress Take 1 capsule (2 mg total) by mouth at bedtime.   QUEtiapine 25 MG tablet Commonly known as: SEROQUEL Take 25 mg by mouth at bedtime.   sildenafil 100 MG tablet Commonly known as: Viagra Take 0.5-1 tablets (50-100 mg total) by mouth daily  as needed for erectile dysfunction.   tadalafil 20 MG tablet Commonly known as: Cialis Take 1 tablet (20 mg total) by mouth daily as needed for up to 30 days for erectile dysfunction.   tadalafil 20 MG tablet Commonly known as: CIALIS TAKE 1 TABLET (20 MG TOTAL) BY MOUTH DAILY AS NEEDED FOR UP TO 30 DAYS FOR ERECTILE DYSFUNCTION.   Testosterone 20.25 MG/ACT (1.62%) Gel Use 2 pumps on each arm   traZODone 50 MG tablet Commonly known as: DESYREL Take 1 tablet (50 mg total) by mouth at bedtime.   vardenafil 20 MG tablet Commonly known as: Levitra Take 1 tablet (20 mg total) by mouth daily as needed for erectile dysfunction.   Xiidra 5 % Soln Generic drug: Lifitegrast INSTILL 1 DROP INTO BOTH EYES TWICE A DAY       Allergies: No Known Allergies  Past Medical History:  Diagnosis Date  . Arthritis   . Depression 07/08/2015  . Diabetes mellitus ([place])   . Diabetes mellitus, type II ([place])   . DJD (degenerative joint disease)   . Gallstones   . GERD (gastroesophageal reflux disease)   . Gout   . HTN (hypertension)   . Hyperglycemia   . Hyperlipidemia   . Male hypogonadism 07/08/2015  . Obesity   . PONV (postoperative nausea and vomiting)     Past Surgical History:  Procedure  Laterality Date  . Back Cyst Excision     x2  . CHOLECYSTECTOMY  08/11/2011   Procedure: LAPAROSCOPIC CHOLECYSTECTOMY WITH INTRAOPERATIVE CHOLANGIOGRAM;  Surgeon: Judieth Keens, DO;  Location: [facility];  Service: General;  Laterality: N/A;  Laparoscopic cholecystectomy with cholangiogram.  . COLONOSCOPY WITH PROPOFOL N/A 09/15/2015   Procedure: COLONOSCOPY WITH PROPOFOL;  Surgeon: Gatha Mayer, MD;  Location: [facility];  Service: Endoscopy;  Laterality: N/A;  . JOINT REPLACEMENT  01/26/2011   right knee  . KNEE ARTHROSCOPY     x 2 left  . KNEE ARTHROSCOPY     x 2 right  . REPAIR KNEE LIGAMENT     Left  . RIGHT FOOT     BONES STRAIGHTENED OUT   2006  . Shoulder Surgery, Reconstruction of Joint     Left x 3, Last 02/2007  . Tennis elbow surgery, Nerve entrapment     x2 right    Family History  Problem Relation Age of Onset  . Sarcoidosis Mother   . Colon cancer Brother 35       half-brother  . Ovarian cancer Maternal Grandmother   . Cancer Neg Hx   . Heart disease Neg Hx   . Kidney disease Neg Hx     Social History:  reports that he has never smoked. He has never used smokeless tobacco. He reports that he does not drink alcohol or use drugs.  Review of Systems   Hypertension, uses Benicar  BP Readings from Last 3 Encounters:  11/30/18 120/72  09/29/18 128/86  07/27/18 108/72     General Examination:   There were no vitals taken for this visit.     Assessment/ Plan:  Hypogonadism, diagnosed around 2016  He had symptomatic hypogonadotropic hypogonadism  He has been treated with testosterone gel, did not respond adequately to clomiphene  Currently he is subjectively feeling good with his energy level Using 2 pumps on each shoulder every day  He has been more consistent with his AndroGel but his prescription apparently ran out 2 days before his labs were drawn  Testosterone level is  significantly low below 200 New prescription will be sent and he can  have his testosterone level rechecked in 6 weeks  DIABETES: Has not had any success with weight loss  He is considering bariatric surgery and will discuss with PCP In the meantime he can discuss use of GLP-1 drug to help with weight loss    Elayne Snare 03/07/2019, 3:00 PM     Note: This office note was prepared with Dragon voice recognition system technology. Any transcriptional errors that result from this process are unintentional.

## 2019-03-13 ENCOUNTER — Ambulatory Visit (INDEPENDENT_AMBULATORY_CARE_PROVIDER_SITE_OTHER): Payer: BC Managed Care – PPO | Admitting: Internal Medicine

## 2019-03-13 DIAGNOSIS — I10 Essential (primary) hypertension: Secondary | ICD-10-CM

## 2019-03-13 DIAGNOSIS — E785 Hyperlipidemia, unspecified: Secondary | ICD-10-CM

## 2019-03-13 DIAGNOSIS — E119 Type 2 diabetes mellitus without complications: Secondary | ICD-10-CM

## 2019-03-13 DIAGNOSIS — F329 Major depressive disorder, single episode, unspecified: Secondary | ICD-10-CM | POA: Diagnosis not present

## 2019-03-13 DIAGNOSIS — F32A Depression, unspecified: Secondary | ICD-10-CM

## 2019-03-13 NOTE — Patient Instructions (Addendum)
Please continue all other medications as before, and refills have been done if requested.  Please have the pharmacy call with any other refills you may need.  Please continue your efforts at being more active, low cholesterol diet, and weight control.  Please keep your appointments with your specialists as you may have planned  The office will call to have you get a Flu shot and Prevnar 13 pneumonia shot

## 2019-03-13 NOTE — Progress Notes (Deleted)
   Subjective:    Patient ID: [name], male    DOB: [birth year], 53 y.o.   MRN: [id number]  HPI   Recent a1c normal per endo.   Review of Systems     Objective:   Physical Exam   Lab Results  Component Value Date   WBC 5.5 03/05/2019   HGB 12.9 (L) 03/05/2019   HCT 41.0 03/05/2019   PLT 206.0 03/05/2019   GLUCOSE 123 (H) 03/05/2019   CHOL 165 12/15/2017   TRIG 61.0 12/15/2017   HDL 39.90 12/15/2017   LDLCALC 113 (H) 12/15/2017   ALT 38 11/27/2018   AST 20 11/27/2018   NA 137 11/27/2018   K 4.2 11/27/2018   CL 102 11/27/2018   CREATININE 0.92 11/27/2018   BUN 13 11/27/2018   CO2 28 11/27/2018   TSH 1.78 12/15/2017   PSA 0.28 03/15/2017   INR 0.96 05/14/2011   HGBA1C 6.5 03/05/2019   MICROALBUR <0.7 03/15/2017        Assessment & Plan:

## 2019-03-19 ENCOUNTER — Encounter: Payer: Self-pay | Admitting: Internal Medicine

## 2019-03-19 NOTE — Assessment & Plan Note (Signed)
stable overall by history and exam, recent data reviewed with pt, and pt to continue medical treatment as before,  to f/u any worsening symptoms or concerns  

## 2019-03-19 NOTE — Progress Notes (Signed)
Patient ID: [name], male   DOB: 25-Mar-1966, 53 y.o.   MRN: [id number]  Virtual Visit via Video Note  I connected with [name] on 03/19/19 at  8:00 AM EDT by a video enabled telemedicine application and verified that I am speaking with the correct person using two identifiers.  Location: Patient: at home Provider: at office   I discussed the limitations of evaluation and management by telemedicine and the availability of in person appointments. The patient expressed understanding and agreed to proceed.  History of Present Illness: Here to f/u; overall doing ok,  Pt denies chest pain, increasing sob or doe, wheezing, orthopnea, PND, increased LE swelling, palpitations, dizziness or syncope.  Pt denies new neurological symptoms such as new headache, or facial or extremity weakness or numbness.  Pt denies polydipsia, polyuria, or low sugar episode.  Pt states overall good compliance with meds, mostly trying to follow appropriate diet.  Recent a1c normal per pt per endo.  BP at home < 140/90.   Pt denies fever, wt loss, night sweats, loss of appetite, or other constitutional symptoms  Denies worsening depressive symptoms, suicidal ideation, or panic Past Medical History:  Diagnosis Date  . Arthritis   . Depression 07/08/2015  . Diabetes mellitus ([place])   . Diabetes mellitus, type II ([place])   . DJD (degenerative joint disease)   . Gallstones   . GERD (gastroesophageal reflux disease)   . Gout   . HTN (hypertension)   . Hyperglycemia   . Hyperlipidemia   . Male hypogonadism 07/08/2015  . Obesity   . PONV (postoperative nausea and vomiting)    Past Surgical History:  Procedure Laterality Date  . Back Cyst Excision     x2  . CHOLECYSTECTOMY  08/11/2011   Procedure: LAPAROSCOPIC CHOLECYSTECTOMY WITH INTRAOPERATIVE CHOLANGIOGRAM;  Surgeon: Judieth Keens, DO;  Location: [facility];  Service: General;  Laterality: N/A;  Laparoscopic cholecystectomy with cholangiogram.  . COLONOSCOPY WITH  PROPOFOL N/A 09/15/2015   Procedure: COLONOSCOPY WITH PROPOFOL;  Surgeon: Gatha Mayer, MD;  Location: [facility];  Service: Endoscopy;  Laterality: N/A;  . JOINT REPLACEMENT  01/26/2011   right knee  . KNEE ARTHROSCOPY     x 2 left  . KNEE ARTHROSCOPY     x 2 right  . REPAIR KNEE LIGAMENT     Left  . RIGHT FOOT     BONES STRAIGHTENED OUT   2006  . Shoulder Surgery, Reconstruction of Joint     Left x 3, Last 02/2007  . Tennis elbow surgery, Nerve entrapment     x2 right    reports that he has never smoked. He has never used smokeless tobacco. He reports that he does not drink alcohol or use drugs. family history includes Colon cancer (age of onset: 40) in his brother; Ovarian cancer in his maternal grandmother; Sarcoidosis in his mother. No Known Allergies Current Outpatient Medications on File Prior to Visit  Medication Sig Dispense Refill  . amLODipine (NORVASC) 5 MG tablet Take 1 tablet (5 mg total) by mouth daily. 90 tablet 3  . buPROPion (WELLBUTRIN SR) 150 MG 12 hr tablet Take 150 mg by mouth daily.    . busPIRone (BUSPAR) 10 MG tablet Take 10 mg by mouth 3 (three) times daily.    . busPIRone (BUSPAR) 15 MG tablet Take 15 mg by mouth at bedtime.    . celecoxib (CELEBREX) 200 MG capsule Take 1 capsule (200 mg total) by mouth 2 (two) times daily. [place]  capsule 2  . cromolyn (OPTICROM) 4 % ophthalmic solution INSTILL 1 DROP INTO EACH EYE 4 TIMES DAILY AT REGULAR INTERVALS.    [name] cyclobenzaprine (FLEXERIL) 5 MG tablet Take 1 tablet (5 mg total) by mouth 3 (three) times daily as needed for muscle spasms. 40 tablet 1  . diclofenac sodium (VOLTAREN) 1 % GEL Apply 4 g topically 4 (four) times daily as needed. 400 g 1  . dicyclomine (BENTYL) 10 MG capsule     . DULoxetine (CYMBALTA) 30 MG capsule Take 30 mg by mouth daily.    [name] escitalopram (LEXAPRO) 20 MG tablet Take 1 tablet (20 mg total) by mouth daily. 30 tablet 2  . fluticasone (FLONASE) 50 MCG/ACT nasal spray Place 2 sprays into both  nostrils daily. 16 g 6  . gabapentin (NEURONTIN) 300 MG capsule Take 300 mg by mouth every 8 (eight) hours as needed.    [name] HYDROcodone-acetaminophen (NORCO/VICODIN) 5-325 MG tablet Take 1 tablet by mouth every 6 (six) hours as needed for moderate pain. 40 tablet 0  . hyoscyamine (LEVSIN SL) 0.125 MG SL tablet Place 1 tablet (0.125 mg total) under the tongue every 4 (four) hours as needed for up to 10 days. 30 tablet 5  . indomethacin (INDOCIN) 50 MG capsule TAKE ONE CAPSULE EVERY 6 HOURS AS NEEDED GOUT ONLY 60 capsule 2  . meloxicam (MOBIC) 15 MG tablet TAKE 1 TABLET (15 MG TOTAL) BY MOUTH DAILY. 90 tablet 3  . meloxicam (MOBIC) 15 MG tablet Take 1 tablet (15 mg total) by mouth daily. 30 tablet 3  . metFORMIN (GLUCOPHAGE) 500 MG tablet TAKE 2 TABLETS BY MOUTH TWICE A DAY 360 tablet 1  . metoprolol tartrate (LOPRESSOR) 25 MG tablet     . olmesartan (BENICAR) 20 MG tablet TAKE 1 TABLET (20 MG TOTAL) BY MOUTH DAILY. 90 tablet 1  . omeprazole (PRILOSEC) 40 MG capsule TAKE 1 CAPSULE BY MOUTH EVERY DAY 90 capsule 1  . ondansetron (ZOFRAN) 4 MG tablet Take 1 tablet (4 mg total) by mouth every 8 (eight) hours as needed for nausea or vomiting. 20 tablet 0  . oxyCODONE-acetaminophen (PERCOCET) 10-325 MG tablet Take 1 tablet by mouth every 8 (eight) hours as needed for pain. 20 tablet 0  . prazosin (MINIPRESS) 2 MG capsule Take 1 capsule (2 mg total) by mouth at bedtime. 30 capsule 1  . QUEtiapine (SEROQUEL) 25 MG tablet Take 25 mg by mouth at bedtime.    . sildenafil (VIAGRA) 100 MG tablet Take 0.5-1 tablets (50-100 mg total) by mouth daily as needed for erectile dysfunction. 5 tablet 11  . tadalafil (ADCIRCA/CIALIS) 20 MG tablet TAKE 1 TABLET (20 MG TOTAL) BY MOUTH DAILY AS NEEDED FOR UP TO 30 DAYS FOR ERECTILE DYSFUNCTION.    . tadalafil (CIALIS) 20 MG tablet Take 1 tablet (20 mg total) by mouth daily as needed for up to 30 days for erectile dysfunction. 10 tablet 11  . Testosterone 20.25 MG/ACT (1.62%)  GEL Use 2 pumps on each arm 300 g 1  . traZODone (DESYREL) 50 MG tablet Take 1 tablet (50 mg total) by mouth at bedtime. 30 tablet 1  . vardenafil (LEVITRA) 20 MG tablet Take 1 tablet (20 mg total) by mouth daily as needed for erectile dysfunction. 6 tablet 5  . XIIDRA 5 % SOLN INSTILL 1 DROP INTO BOTH EYES TWICE A DAY     No current facility-administered medications on file prior to visit.     Observations/Objective: Alert, NAD, appropriate mood  and affect, resps normal, cn 2-12 intact, moves all 4s, no visible rash or swelling Lab Results  Component Value Date   WBC 5.5 03/05/2019   HGB 12.9 (L) 03/05/2019   HCT 41.0 03/05/2019   PLT 206.0 03/05/2019   GLUCOSE 123 (H) 03/05/2019   CHOL 165 12/15/2017   TRIG 61.0 12/15/2017   HDL 39.90 12/15/2017   LDLCALC 113 (H) 12/15/2017   ALT 38 11/27/2018   AST 20 11/27/2018   NA 137 11/27/2018   K 4.2 11/27/2018   CL 102 11/27/2018   CREATININE 0.92 11/27/2018   BUN 13 11/27/2018   CO2 28 11/27/2018   TSH 1.78 12/15/2017   PSA 0.28 03/15/2017   INR 0.96 05/14/2011   HGBA1C 6.5 03/05/2019   MICROALBUR <0.7 03/15/2017   Assessment and Plan: See notes  Follow Up Instructions: See notes   I discussed the assessment and treatment plan with the patient. The patient was provided an opportunity to ask questions and all were answered. The patient agreed with the plan and demonstrated an understanding of the instructions.   The patient was advised to call back or seek an in-person evaluation if the symptoms worsen or if the condition fails to improve as anticipated.   Cathlean Cower, MD

## 2019-03-22 ENCOUNTER — Encounter: Payer: Self-pay | Admitting: Podiatry

## 2019-03-22 ENCOUNTER — Ambulatory Visit (INDEPENDENT_AMBULATORY_CARE_PROVIDER_SITE_OTHER): Payer: BC Managed Care – PPO | Admitting: Podiatry

## 2019-03-22 ENCOUNTER — Telehealth: Payer: Self-pay | Admitting: Podiatry

## 2019-03-22 ENCOUNTER — Other Ambulatory Visit: Payer: Self-pay

## 2019-03-22 DIAGNOSIS — M779 Enthesopathy, unspecified: Secondary | ICD-10-CM | POA: Diagnosis not present

## 2019-03-22 DIAGNOSIS — M778 Other enthesopathies, not elsewhere classified: Secondary | ICD-10-CM

## 2019-03-22 NOTE — Telephone Encounter (Signed)
Pt was seen in office today and states that he needs an updated note from Dr. Milinda Pointer writing him out of work for the next 6 weeks. He states he forgot to get the letter before he left but will need a new one faxed to his employer.  Fax# [phone] Attn: Ignatius Specking

## 2019-03-23 ENCOUNTER — Telehealth: Payer: Self-pay | Admitting: Endocrinology

## 2019-03-23 ENCOUNTER — Ambulatory Visit (INDEPENDENT_AMBULATORY_CARE_PROVIDER_SITE_OTHER): Payer: BC Managed Care – PPO

## 2019-03-23 DIAGNOSIS — Z23 Encounter for immunization: Secondary | ICD-10-CM

## 2019-03-23 DIAGNOSIS — Z299 Encounter for prophylactic measures, unspecified: Secondary | ICD-10-CM

## 2019-03-23 NOTE — Telephone Encounter (Signed)
Called pt and clarified. He stated that he did not take the testosterone gel because he was getting the Flu shot and he did not know if there would be a medication interaction.  Pt was advised to resume taking the testosterone gel tomorrow. He verbalized understanding.

## 2019-03-23 NOTE — Telephone Encounter (Signed)
Patient stated he got his pneumonia and flu shot today and did not put on his  Testosterone 20.25 MG/ACT (1.62%) GEL but would like to know if he can resume with it tomorrow?  Please Advise, Thanks

## 2019-03-24 ENCOUNTER — Other Ambulatory Visit: Payer: Self-pay | Admitting: Internal Medicine

## 2019-03-24 ENCOUNTER — Encounter: Payer: Self-pay | Admitting: Podiatry

## 2019-03-24 NOTE — Progress Notes (Signed)
He presents today for follow-up of his right ankle states that is doing a lot better than it was.  He states that I feel like my big toe is trying to pull apart as I walk on it states that I was not even able to walk on the sand with this.  Objective: Vital signs are stable he is alert and oriented x3.  Pulses are palpable.  Much decrease in edema no erythema cellulitis drainage odor good range of motion of the first metatarsal phalangeal joint I do still see what he is talking about as far as how the pain is affecting him.  Radiographically and evaluating radiographs from prior I do still see any reason for this to be any different than any other implant.  Assessment: Resolving capsulitis of the right ankle chronic pain to the left great toe status post Keller arthroplasty with a single silicone implant.  May need to consider fusion at some point.

## 2019-04-05 LAB — HM DIABETES EYE EXAM

## 2019-04-12 DIAGNOSIS — U071 COVID-19: Secondary | ICD-10-CM | POA: Insufficient documentation

## 2019-05-08 ENCOUNTER — Ambulatory Visit: Payer: BC Managed Care – PPO | Admitting: Podiatry

## 2019-05-10 ENCOUNTER — Other Ambulatory Visit: Payer: Self-pay | Admitting: Endocrinology

## 2019-05-10 ENCOUNTER — Telehealth: Payer: Self-pay | Admitting: Endocrinology

## 2019-05-10 MED ORDER — TESTOSTERONE 20.25 MG/ACT (1.62%) TD GEL: TRANSDERMAL | 1 refills | Status: DC

## 2019-05-10 NOTE — Telephone Encounter (Signed)
Done

## 2019-05-10 NOTE — Telephone Encounter (Signed)
Called pt and gave him MD messages. Pt verbalized understanding and stated that he would need a new RX as well.  Pt was scheduled for 6 week f/u with labs prior. Could you please send new RX for pt?

## 2019-05-10 NOTE — Telephone Encounter (Signed)
He can resume his AndroGel now and take it regularly.  Needs follow-up in about 6 weeks, labs before visit

## 2019-05-10 NOTE — Telephone Encounter (Signed)
Patient ph# [phone] called re: Patient has Covid 19. Patient stopped using Androgel on 05/02/19 due to the virus and being so sick. Please call patient to advise next steps he should take re: Androgel.

## 2019-05-14 ENCOUNTER — Ambulatory Visit (INDEPENDENT_AMBULATORY_CARE_PROVIDER_SITE_OTHER): Payer: BC Managed Care – PPO | Admitting: Internal Medicine

## 2019-05-14 DIAGNOSIS — U071 COVID-19: Secondary | ICD-10-CM

## 2019-05-14 DIAGNOSIS — E119 Type 2 diabetes mellitus without complications: Secondary | ICD-10-CM

## 2019-05-14 DIAGNOSIS — Z8619 Personal history of other infectious and parasitic diseases: Secondary | ICD-10-CM | POA: Diagnosis not present

## 2019-05-14 DIAGNOSIS — F411 Generalized anxiety disorder: Secondary | ICD-10-CM | POA: Diagnosis not present

## 2019-05-14 MED ORDER — BENZONATATE 100 MG PO CAPS: 100.0000 mg | ORAL_CAPSULE | Freq: Three times a day (TID) | ORAL | 0 refills | Status: DC | PRN

## 2019-05-14 NOTE — Progress Notes (Addendum)
Patient ID: [name], male   DOB: 26-Apr-1966, 53 y.o.   MRN: [id number]  Virtual Visit via Video Note  I connected with [name] on 05/14/19 at  3:40 PM EST by a video enabled telemedicine application and verified that I am speaking with the correct person using two identifiers.  Location: Patient: at home Provider: at office   I discussed the limitations of evaluation and management by telemedicine and the availability of in person appointments. The patient expressed understanding and agreed to proceed.  History of Present Illness: Here to f/u post COVID infection; overall doing ok,  Pt denies chest pain, increasing sob or doe, wheezing, orthopnea, PND, increased LE swelling, palpitations, dizziness or syncope.  Pt denies new neurological symptoms such as new headache, or facial or extremity weakness or numbness.  Pt denies polydipsia, polyuria, but does have ongoing fatigue, "brain fog" and residual non productive annoying cough.   Pt denies fever, wt loss, night sweats, loss of appetite, or other constitutional symptoms.  Denies worsening depressive symptoms, suicidal ideation, or panic Past Medical History:  Diagnosis Date  . Arthritis   . Depression 07/08/2015  . Diabetes mellitus ([place])   . Diabetes mellitus, type II ([place])   . DJD (degenerative joint disease)   . Gallstones   . GERD (gastroesophageal reflux disease)   . Gout   . HTN (hypertension)   . Hyperglycemia   . Hyperlipidemia   . Male hypogonadism 07/08/2015  . Obesity   . PONV (postoperative nausea and vomiting)    Past Surgical History:  Procedure Laterality Date  . Back Cyst Excision     x2  . CHOLECYSTECTOMY  08/11/2011   Procedure: LAPAROSCOPIC CHOLECYSTECTOMY WITH INTRAOPERATIVE CHOLANGIOGRAM;  Surgeon: Judieth Keens, DO;  Location: [facility];  Service: General;  Laterality: N/A;  Laparoscopic cholecystectomy with cholangiogram.  . COLONOSCOPY WITH PROPOFOL N/A 09/15/2015   Procedure: COLONOSCOPY WITH  PROPOFOL;  Surgeon: Gatha Mayer, MD;  Location: [facility];  Service: Endoscopy;  Laterality: N/A;  . JOINT REPLACEMENT  01/26/2011   right knee  . KNEE ARTHROSCOPY     x 2 left  . KNEE ARTHROSCOPY     x 2 right  . REPAIR KNEE LIGAMENT     Left  . RIGHT FOOT     BONES STRAIGHTENED OUT   2006  . Shoulder Surgery, Reconstruction of Joint     Left x 3, Last 02/2007  . Tennis elbow surgery, Nerve entrapment     x2 right    reports that he has never smoked. He has never used smokeless tobacco. He reports that he does not drink alcohol or use drugs. family history includes Colon cancer (age of onset: 51) in his brother; Ovarian cancer in his maternal grandmother; Sarcoidosis in his mother. No Known Allergies Current Outpatient Medications on File Prior to Visit  Medication Sig Dispense Refill  . amLODipine (NORVASC) 5 MG tablet Take 1 tablet (5 mg total) by mouth daily. 90 tablet 3  . buPROPion (WELLBUTRIN SR) 150 MG 12 hr tablet Take 150 mg by mouth daily.    . busPIRone (BUSPAR) 10 MG tablet Take 10 mg by mouth 3 (three) times daily.    . busPIRone (BUSPAR) 15 MG tablet Take 15 mg by mouth at bedtime.    . celecoxib (CELEBREX) 200 MG capsule Take 1 capsule (200 mg total) by mouth 2 (two) times daily. 60 capsule 2  . cromolyn (OPTICROM) 4 % ophthalmic solution INSTILL 1 DROP INTO EACH EYE  4 TIMES DAILY AT REGULAR INTERVALS.    [name] cyclobenzaprine (FLEXERIL) 5 MG tablet Take 1 tablet (5 mg total) by mouth 3 (three) times daily as needed for muscle spasms. 40 tablet 1  . diclofenac sodium (VOLTAREN) 1 % GEL Apply 4 g topically 4 (four) times daily as needed. 400 g 1  . dicyclomine (BENTYL) 10 MG capsule     . DULoxetine (CYMBALTA) 30 MG capsule Take 30 mg by mouth daily.    [name] escitalopram (LEXAPRO) 20 MG tablet Take 1 tablet (20 mg total) by mouth daily. 30 tablet 2  . fluticasone (FLONASE) 50 MCG/ACT nasal spray Place 2 sprays into both nostrils daily. 16 g 6  . gabapentin (NEURONTIN) 300  MG capsule Take 300 mg by mouth every 8 (eight) hours as needed.    [name] HYDROcodone-acetaminophen (NORCO/VICODIN) 5-325 MG tablet Take 1 tablet by mouth every 6 (six) hours as needed for moderate pain. 40 tablet 0  . hyoscyamine (LEVSIN SL) 0.125 MG SL tablet Place 1 tablet (0.125 mg total) under the tongue every 4 (four) hours as needed for up to 10 days. 30 tablet 5  . indomethacin (INDOCIN) 50 MG capsule TAKE ONE CAPSULE EVERY 6 HOURS AS NEEDED GOUT ONLY 60 capsule 2  . loperamide (IMODIUM) 2 MG capsule     . meloxicam (MOBIC) 15 MG tablet TAKE 1 TABLET (15 MG TOTAL) BY MOUTH DAILY. 90 tablet 3  . meloxicam (MOBIC) 15 MG tablet Take 1 tablet (15 mg total) by mouth daily. 30 tablet 3  . metFORMIN (GLUCOPHAGE) 500 MG tablet TAKE 2 TABLETS BY MOUTH TWICE A DAY 360 tablet 1  . metoprolol tartrate (LOPRESSOR) 25 MG tablet     . olmesartan (BENICAR) 20 MG tablet TAKE 1 TABLET (20 MG TOTAL) BY MOUTH DAILY. 90 tablet 1  . omeprazole (PRILOSEC) 40 MG capsule TAKE 1 CAPSULE BY MOUTH EVERY DAY 90 capsule 1  . ondansetron (ZOFRAN) 4 MG tablet Take 1 tablet (4 mg total) by mouth every 8 (eight) hours as needed for nausea or vomiting. 20 tablet 0  . oxyCODONE-acetaminophen (PERCOCET) 10-325 MG tablet Take 1 tablet by mouth every 8 (eight) hours as needed for pain. 20 tablet 0  . prazosin (MINIPRESS) 2 MG capsule Take 1 capsule (2 mg total) by mouth at bedtime. 30 capsule 1  . QUEtiapine (SEROQUEL) 25 MG tablet Take 25 mg by mouth at bedtime.    . sildenafil (VIAGRA) 100 MG tablet Take 0.5-1 tablets (50-100 mg total) by mouth daily as needed for erectile dysfunction. 5 tablet 11  . tadalafil (ADCIRCA/CIALIS) 20 MG tablet TAKE 1 TABLET (20 MG TOTAL) BY MOUTH DAILY AS NEEDED FOR UP TO 30 DAYS FOR ERECTILE DYSFUNCTION.    . tadalafil (CIALIS) 20 MG tablet Take 1 tablet (20 mg total) by mouth daily as needed for up to 30 days for erectile dysfunction. 10 tablet 11  . Testosterone 20.25 MG/ACT (1.62%) GEL Use 2 pumps  on each arm 300 g 1  . traZODone (DESYREL) 50 MG tablet Take 1 tablet (50 mg total) by mouth at bedtime. 30 tablet 1  . vardenafil (LEVITRA) 20 MG tablet Take 1 tablet (20 mg total) by mouth daily as needed for erectile dysfunction. 6 tablet 5  . XIIDRA 5 % SOLN INSTILL 1 DROP INTO BOTH EYES TWICE A DAY     No current facility-administered medications on file prior to visit.     Observations/Objective: Alert, NAD, appropriate mood and affect, resps normal, cn 2-12  intact, moves all 4s, no visible rash or swelling Lab Results  Component Value Date   WBC 5.5 03/05/2019   HGB 12.9 (L) 03/05/2019   HCT 41.0 03/05/2019   PLT 206.0 03/05/2019   GLUCOSE 123 (H) 03/05/2019   CHOL 165 12/15/2017   TRIG 61.0 12/15/2017   HDL 39.90 12/15/2017   LDLCALC 113 (H) 12/15/2017   ALT 38 11/27/2018   AST 20 11/27/2018   NA 137 11/27/2018   K 4.2 11/27/2018   CL 102 11/27/2018   CREATININE 0.92 11/27/2018   BUN 13 11/27/2018   CO2 28 11/27/2018   TSH 1.78 12/15/2017   PSA 0.28 03/15/2017   INR 0.96 05/14/2011   HGBA1C 6.5 03/05/2019   MICROALBUR <0.7 03/15/2017   Assessment and Plan: See notes  Follow Up Instructions: See notes   I discussed the assessment and treatment plan with the patient. The patient was provided an opportunity to ask questions and all were answered. The patient agreed with the plan and demonstrated an understanding of the instructions.   The patient was advised to call back or seek an in-person evaluation if the symptoms worsen or if the condition fails to improve as anticipated.   Cathlean Cower, MD

## 2019-05-17 ENCOUNTER — Encounter: Payer: Self-pay | Admitting: Internal Medicine

## 2019-05-17 DIAGNOSIS — U071 COVID-19: Secondary | ICD-10-CM | POA: Insufficient documentation

## 2019-05-17 NOTE — Assessment & Plan Note (Signed)
With residual improving symptoms, ok for symptomatic otc tx and tessalon perle prn cough,  to f/u any worsening symptoms or concerns

## 2019-05-17 NOTE — Assessment & Plan Note (Signed)
Reassured, cont same tx

## 2019-05-17 NOTE — Patient Instructions (Signed)
Please take all new medication as prescribed  - the cough medicine as needed  Please continue all other medications as before, and refills have been done if requested.  Please have the pharmacy call with any other refills you may need.  Please continue your efforts at being more active, low cholesterol diet, and weight control.  Please keep your appointments with your specialists as you may have planned    

## 2019-05-17 NOTE — Assessment & Plan Note (Signed)
stable overall by history and exam, recent data reviewed with pt, and pt to continue medical treatment as before,  to f/u any worsening symptoms or concerns  

## 2019-05-21 ENCOUNTER — Other Ambulatory Visit: Payer: Self-pay | Admitting: Endocrinology

## 2019-05-21 ENCOUNTER — Telehealth: Payer: Self-pay

## 2019-05-21 MED ORDER — TESTOSTERONE 20.25 MG/ACT (1.62%) TD GEL: TRANSDERMAL | 1 refills | Status: DC

## 2019-05-21 NOTE — Telephone Encounter (Signed)
Patient called in stating pharmacy won't let patient fill script patient would like for nurse to give him a call   Medicaine : Testosterone 20.25 MG/ACT (1.62%) GEL  Pharmacy: [id number] - [place], [place] - [address]   Please call and advise

## 2019-05-21 NOTE — Telephone Encounter (Signed)
Patient states that Rx should have been for 90 days instead of 30 days. Call back number is [phone]

## 2019-05-21 NOTE — Telephone Encounter (Signed)
Done

## 2019-05-21 NOTE — Telephone Encounter (Signed)
Called pharmacy and they stated that the pt has a prescription refilled on 05/07/2019 at a different pharmacy. Pt denies this. He was advised to contact his pharmacy and attempt to resolve this.

## 2019-05-21 NOTE — Telephone Encounter (Signed)
Pt states that the pharmacist shows that the pt needs a 90 day supply because he is using 2 pumps on each arm daily. Pt requests a 90 days supply.

## 2019-05-22 ENCOUNTER — Other Ambulatory Visit: Payer: Self-pay | Admitting: Internal Medicine

## 2019-06-05 ENCOUNTER — Ambulatory Visit: Payer: BC Managed Care – PPO

## 2019-06-05 ENCOUNTER — Other Ambulatory Visit: Payer: Self-pay

## 2019-06-05 ENCOUNTER — Ambulatory Visit (INDEPENDENT_AMBULATORY_CARE_PROVIDER_SITE_OTHER): Payer: BC Managed Care – PPO | Admitting: Podiatry

## 2019-06-05 DIAGNOSIS — G5792 Unspecified mononeuropathy of left lower limb: Secondary | ICD-10-CM | POA: Diagnosis not present

## 2019-06-05 DIAGNOSIS — M778 Other enthesopathies, not elsewhere classified: Secondary | ICD-10-CM

## 2019-06-05 MED ORDER — GABAPENTIN 300 MG PO CAPS: ORAL_CAPSULE | ORAL | 2 refills | Status: DC

## 2019-06-05 NOTE — Progress Notes (Signed)
He presents today for follow-up of his left foot pain.  He states that surgical site is still painful but it seems to be improving a little bit.  Objective: Vital signs are stable he is alert and oriented x3.  Pulses are palpable.  He has mild edema about the first metatarsophalangeal joint of the left foot there is got a better range of motion at this point time.  Assessment: Well-healing surgical foot slow progress.  Plan: Encourage range of motion activities.  He was Covid positive several weeks ago.

## 2019-06-20 ENCOUNTER — Other Ambulatory Visit: Payer: Self-pay

## 2019-06-20 ENCOUNTER — Other Ambulatory Visit: Payer: Self-pay | Admitting: Endocrinology

## 2019-06-20 ENCOUNTER — Other Ambulatory Visit (INDEPENDENT_AMBULATORY_CARE_PROVIDER_SITE_OTHER): Payer: BC Managed Care – PPO

## 2019-06-20 DIAGNOSIS — E291 Testicular hypofunction: Secondary | ICD-10-CM | POA: Diagnosis not present

## 2019-06-20 LAB — CBC
HCT: 38.7 % — ABNORMAL LOW (ref 39.0–52.0)
Hemoglobin: 12.2 g/dL — ABNORMAL LOW (ref 13.0–17.0)
MCHC: 31.7 g/dL (ref 30.0–36.0)
MCV: 82.3 fl (ref 78.0–100.0)
Platelets: 245 10*3/uL (ref 150.0–400.0)
RBC: 4.7 Mil/uL (ref 4.22–5.81)
RDW: 15.3 % (ref 11.5–15.5)
WBC: 7 10*3/uL (ref 4.0–10.5)

## 2019-06-20 LAB — TESTOSTERONE: Testosterone: 445.28 ng/dL (ref 300.00–890.00)

## 2019-06-21 ENCOUNTER — Other Ambulatory Visit: Payer: Self-pay

## 2019-06-25 ENCOUNTER — Other Ambulatory Visit: Payer: Self-pay

## 2019-06-25 ENCOUNTER — Encounter: Payer: Self-pay | Admitting: Endocrinology

## 2019-06-25 ENCOUNTER — Ambulatory Visit (INDEPENDENT_AMBULATORY_CARE_PROVIDER_SITE_OTHER): Payer: BC Managed Care – PPO | Admitting: Endocrinology

## 2019-06-25 VITALS — BP 120/78 | HR 88 | Ht 74.0 in | Wt 363.0 lb

## 2019-06-25 DIAGNOSIS — E291 Testicular hypofunction: Secondary | ICD-10-CM | POA: Diagnosis not present

## 2019-06-25 NOTE — Progress Notes (Signed)
Patient ID: [name], male   DOB: 03-26-1966, 53 y.o.   MRN: [id number]            Chief complaint: Follow-up of low testosterone  History of Present Illness  Hypogonadismwas diagnosed in 2012  He has had complaints offatigue, decreased motivation, decreased libido, and general feeling bad The symptoms started a few years ago and somewhat worse for couple of months before his visit in 7/19 No obvious causes of low testosterone were identified on his initial consultation In 2012 his free testosterone level was low normal at 47 with total 180  Repeat free testosterone was 8.0 done on 01/16/2018 in the morning at 9:30 AM  RECENT history: With using clomiphene he did not have adequate response of the testosterone level and also LH had gone above normal  After his visit in 7/19 he was back on AndroGel 1.60% This was increased to 4 pumps a day  Overall had been feeling better with his energy level and currently not complaining of feeling fatigued, lack of motivation or libido Previously had been somewhat irregular with his testosterone gel including in 9/19 However after resuming regular treatment in 2/20 his testosterone level was significantly higher at 533  Treatment regimen: Now taking 2 pumps on each arm of the AndroGel  He is applying the AndroGel regularly every morning after his shower and is allowing the gel to get dry, he is applying the gel directly on his shoulders as directed On his previous visit he had been out of his medication for 2 days Does not have any issues with getting refills on time  Now does not complain of any recent fatigue or lethargy His libido is improving but not normal yet  His lab results showtestosterone level to be back up to 445, his previous 2 levels had been low    Lab Results  Component Value Date   TESTOSTERONE 445.28 06/20/2019   TESTOSTERONE 164.72 (L) 03/05/2019   TESTOSTERONE 180.49 (L) 11/30/2018   TESTOSTERONE 532.62 08/28/2018     Baseline prolactin level: 8.3  Baseline LH of 7.4  Lab Results  Component Value Date   LH 13.04 (H) 03/29/2018   Lab Results  Component Value Date   HGB 12.2 (L) 06/20/2019   DIABETES: He has had type 2 diabetes treated by his PCP with metformin Does not appear to be taking Invokana as before  Diagnosis initially was in 2014 with an A1c of 10.9  Followed by PCP last in August 2020 Still has difficulty losing weight but apparently going to weight management program next month   Wt Readings from Last 3 Encounters:  06/25/19 (!) 363 lb (164.7 kg)  11/30/18 (!) 360 lb (163.3 kg)  09/29/18 (!) 356 lb (161.5 kg)    Lab Results  Component Value Date   HGBA1C 6.5 03/05/2019   HGBA1C 6.7 (H) 11/30/2018   HGBA1C 5.9 12/15/2017   Lab Results  Component Value Date   MICROALBUR <0.7 03/15/2017   LDLCALC 113 (H) 12/15/2017   CREATININE 0.92 11/27/2018       Allergies as of 06/25/2019   No Known Allergies     Medication List       Accurate as of June 25, 2019  9:05 AM. If you have any questions, ask your nurse or doctor.        amLODipine 5 MG tablet Commonly known as: NORVASC Take 1 tablet (5 mg total) by mouth daily.   benzonatate 100 MG capsule Commonly known as: Mining engineer  Perles Take 1 capsule (100 mg total) by mouth 3 (three) times daily as needed for cough.   buPROPion 150 MG 12 hr tablet Commonly known as: WELLBUTRIN SR Take 150 mg by mouth daily.   busPIRone 10 MG tablet Commonly known as: BUSPAR Take 10 mg by mouth 3 (three) times daily.   busPIRone 15 MG tablet Commonly known as: BUSPAR Take 15 mg by mouth at bedtime.   celecoxib 200 MG capsule Commonly known as: CeleBREX Take 1 capsule (200 mg total) by mouth 2 (two) times daily.   cromolyn 4 % ophthalmic solution Commonly known as: OPTICROM INSTILL 1 DROP INTO EACH EYE 4 TIMES DAILY AT REGULAR INTERVALS.   cyclobenzaprine 5 MG tablet Commonly known as: FLEXERIL Take 1 tablet  (5 mg total) by mouth 3 (three) times daily as needed for muscle spasms.   diclofenac sodium 1 % Gel Commonly known as: Voltaren Apply 4 g topically 4 (four) times daily as needed.   dicyclomine 10 MG capsule Commonly known as: BENTYL   DULoxetine 30 MG capsule Commonly known as: CYMBALTA Take 30 mg by mouth daily.   escitalopram 20 MG tablet Commonly known as: Lexapro Take 1 tablet (20 mg total) by mouth daily.   fluticasone 50 MCG/ACT nasal spray Commonly known as: FLONASE Place 2 sprays into both nostrils daily.   gabapentin 300 MG capsule Commonly known as: NEURONTIN Take one AM, one PM, two QHS   HYDROcodone-acetaminophen 5-325 MG tablet Commonly known as: NORCO/VICODIN Take 1 tablet by mouth every 6 (six) hours as needed for moderate pain.   hyoscyamine 0.125 MG SL tablet Commonly known as: LEVSIN SL Place 1 tablet (0.125 mg total) under the tongue every 4 (four) hours as needed for up to 10 days.   indomethacin 50 MG capsule Commonly known as: INDOCIN TAKE ONE CAPSULE EVERY 6 HOURS AS NEEDED GOUT ONLY   loperamide 2 MG capsule Commonly known as: IMODIUM   meloxicam 15 MG tablet Commonly known as: MOBIC TAKE 1 TABLET (15 MG TOTAL) BY MOUTH DAILY.   meloxicam 15 MG tablet Commonly known as: MOBIC Take 1 tablet (15 mg total) by mouth daily.   metFORMIN 500 MG tablet Commonly known as: GLUCOPHAGE TAKE 2 TABLETS BY MOUTH TWICE A DAY   metoprolol tartrate 25 MG tablet Commonly known as: LOPRESSOR   olmesartan 20 MG tablet Commonly known as: BENICAR TAKE 1 TABLET BY MOUTH EVERY DAY   omeprazole 40 MG capsule Commonly known as: PRILOSEC TAKE 1 CAPSULE BY MOUTH EVERY DAY   ondansetron 4 MG tablet Commonly known as: Zofran Take 1 tablet (4 mg total) by mouth every 8 (eight) hours as needed for nausea or vomiting.   oxyCODONE-acetaminophen 10-325 MG tablet Commonly known as: PERCOCET Take 1 tablet by mouth every 8 (eight) hours as needed for pain.    prazosin 2 MG capsule Commonly known as: Minipress Take 1 capsule (2 mg total) by mouth at bedtime.   QUEtiapine 25 MG tablet Commonly known as: SEROQUEL Take 25 mg by mouth at bedtime.   sildenafil 100 MG tablet Commonly known as: Viagra Take 0.5-1 tablets (50-100 mg total) by mouth daily as needed for erectile dysfunction.   tadalafil 20 MG tablet Commonly known as: Cialis Take 1 tablet (20 mg total) by mouth daily as needed for up to 30 days for erectile dysfunction.   tadalafil 20 MG tablet Commonly known as: CIALIS TAKE 1 TABLET (20 MG TOTAL) BY MOUTH DAILY AS NEEDED FOR UP TO 30  DAYS FOR ERECTILE DYSFUNCTION.   Testosterone 20.25 MG/ACT (1.62%) Gel Use 2 pumps on each arm   traZODone 50 MG tablet Commonly known as: DESYREL Take 1 tablet (50 mg total) by mouth at bedtime.   vardenafil 20 MG tablet Commonly known as: Levitra Take 1 tablet (20 mg total) by mouth daily as needed for erectile dysfunction.   Xiidra 5 % Soln Generic drug: Lifitegrast INSTILL 1 DROP INTO BOTH EYES TWICE A DAY       Allergies: No Known Allergies  Past Medical History:  Diagnosis Date  . Arthritis   . Depression 07/08/2015  . Diabetes mellitus ([place])   . Diabetes mellitus, type II ([place])   . DJD (degenerative joint disease)   . Gallstones   . GERD (gastroesophageal reflux disease)   . Gout   . HTN (hypertension)   . Hyperglycemia   . Hyperlipidemia   . Male hypogonadism 07/08/2015  . Obesity   . PONV (postoperative nausea and vomiting)     Past Surgical History:  Procedure Laterality Date  . Back Cyst Excision     x2  . CHOLECYSTECTOMY  08/11/2011   Procedure: LAPAROSCOPIC CHOLECYSTECTOMY WITH INTRAOPERATIVE CHOLANGIOGRAM;  Surgeon: Judieth Keens, DO;  Location: [facility];  Service: General;  Laterality: N/A;  Laparoscopic cholecystectomy with cholangiogram.  . COLONOSCOPY WITH PROPOFOL N/A 09/15/2015   Procedure: COLONOSCOPY WITH PROPOFOL;  Surgeon: Gatha Mayer, MD;   Location: [facility];  Service: Endoscopy;  Laterality: N/A;  . JOINT REPLACEMENT  01/26/2011   right knee  . KNEE ARTHROSCOPY     x 2 left  . KNEE ARTHROSCOPY     x 2 right  . REPAIR KNEE LIGAMENT     Left  . RIGHT FOOT     BONES STRAIGHTENED OUT   2006  . Shoulder Surgery, Reconstruction of Joint     Left x 3, Last 02/2007  . Tennis elbow surgery, Nerve entrapment     x2 right    Family History  Problem Relation Age of Onset  . Sarcoidosis Mother   . Colon cancer Brother 14       half-brother  . Ovarian cancer Maternal Grandmother   . Cancer Neg Hx   . Heart disease Neg Hx   . Kidney disease Neg Hx     Social History:  reports that he has never smoked. He has never used smokeless tobacco. He reports that he does not drink alcohol or use drugs.  Review of Systems   Hypertension, uses Benicar  BP Readings from Last 3 Encounters:  06/25/19 120/78  11/30/18 120/72  09/29/18 128/86     General Examination:   BP 120/78   Pulse 88   Ht 6\' 2"  (1.88 m)   Wt (!) 363 lb (164.7 kg)   SpO2 98%   BMI 46.61 kg/m      Assessment/ Plan:  Hypogonadism, diagnosed around 2016  He had symptomatic hypogonadotropic hypogonadism  He has been treated with AndroGel 1.62% for some time He is very consistent with his testosterone gel, 2 pumps on each arm daily  He feels subjectively better with his energy level and libido Testosterone level is over 400 now  He can continue the same regimen, he is currently able to follow the instructions on a regular basis  DIABETES with recent BMI about 47: Currently on Metformin only  He may be a candidate for Rybelsus which will enhance his weight loss, asked him to discuss this with his PCP on  his next visit Previously on Invokana, not currently on this Also discussed importance of exercise for weight loss  He will also follow-up for other issues with his PCP including mild anemia   Follow-up in 6 months  [name]  06/25/2019, 9:05 AM     Note: This office note was prepared with Dragon voice recognition system technology. Any transcriptional errors that result from this process are unintentional.

## 2019-07-02 ENCOUNTER — Other Ambulatory Visit: Payer: Self-pay | Admitting: Internal Medicine

## 2019-07-04 ENCOUNTER — Encounter: Payer: Self-pay | Admitting: *Deleted

## 2019-07-04 ENCOUNTER — Telehealth: Payer: Self-pay | Admitting: *Deleted

## 2019-07-04 NOTE — Telephone Encounter (Signed)
My Chart message sent

## 2019-07-04 NOTE — Telephone Encounter (Signed)
Copied from [place] [id number]. Topic: General - Inquiry >> Jul 03, 2019  3:09 PM Erick Blinks wrote: Reason for CRM: Pt wants to know if PCP would recommend that he receive the vaccine for Covid 19. Can follow up with Pt via [place] contact: [phone]

## 2019-07-16 ENCOUNTER — Encounter (INDEPENDENT_AMBULATORY_CARE_PROVIDER_SITE_OTHER): Payer: Self-pay | Admitting: Bariatrics

## 2019-07-16 ENCOUNTER — Ambulatory Visit (INDEPENDENT_AMBULATORY_CARE_PROVIDER_SITE_OTHER): Payer: BC Managed Care – PPO | Admitting: Bariatrics

## 2019-07-16 ENCOUNTER — Other Ambulatory Visit: Payer: Self-pay

## 2019-07-16 VITALS — BP 114/78 | HR 82 | Temp 98.6°F | Ht 74.0 in | Wt 359.0 lb

## 2019-07-16 DIAGNOSIS — R0602 Shortness of breath: Secondary | ICD-10-CM | POA: Diagnosis not present

## 2019-07-16 DIAGNOSIS — R5383 Other fatigue: Secondary | ICD-10-CM

## 2019-07-16 DIAGNOSIS — Z9989 Dependence on other enabling machines and devices: Secondary | ICD-10-CM

## 2019-07-16 DIAGNOSIS — Z6841 Body Mass Index (BMI) 40.0 and over, adult: Secondary | ICD-10-CM

## 2019-07-16 DIAGNOSIS — Z9189 Other specified personal risk factors, not elsewhere classified: Secondary | ICD-10-CM | POA: Diagnosis not present

## 2019-07-16 DIAGNOSIS — E559 Vitamin D deficiency, unspecified: Secondary | ICD-10-CM

## 2019-07-16 DIAGNOSIS — E291 Testicular hypofunction: Secondary | ICD-10-CM

## 2019-07-16 DIAGNOSIS — E119 Type 2 diabetes mellitus without complications: Secondary | ICD-10-CM | POA: Diagnosis not present

## 2019-07-16 DIAGNOSIS — Z1331 Encounter for screening for depression: Secondary | ICD-10-CM

## 2019-07-16 DIAGNOSIS — G4733 Obstructive sleep apnea (adult) (pediatric): Secondary | ICD-10-CM

## 2019-07-16 DIAGNOSIS — M25562 Pain in left knee: Secondary | ICD-10-CM

## 2019-07-16 DIAGNOSIS — Z0289 Encounter for other administrative examinations: Secondary | ICD-10-CM

## 2019-07-16 DIAGNOSIS — I10 Essential (primary) hypertension: Secondary | ICD-10-CM | POA: Diagnosis not present

## 2019-07-16 DIAGNOSIS — M25561 Pain in right knee: Secondary | ICD-10-CM

## 2019-07-16 DIAGNOSIS — E66813 Obesity, class 3: Secondary | ICD-10-CM

## 2019-07-16 NOTE — Progress Notes (Signed)
Dear [name],   Thank you for referring [name] to our clinic. The following note includes my evaluation and treatment recommendations.  Subjective:   Chief Complaint: OBESITY [name] (MR# [id number]) is a 54 y.o. male who presents for evaluation and treatment of obesity and related comorbidities. Current BMI is Body mass index is 46.09 kg/m.[name] [name] has been struggling with his weight for many years and has been unsuccessful in either losing weight, maintaining weight loss, or reaching his healthy weight goal.  [name] states he is currently in the action stage of change and ready to dedicate time achieving and maintaining a healthier weight. [name] is interested in becoming our patient and working on intensive lifestyle modifications including (but not limited to) diet and exercise for weight loss.  [name] eats outside the home 4 times a week and reports he is a "picky eater." He snacks on chips. He reports he is going to have left knee replacement and needs his BMI to be below 40.  [name]'s habits were reviewed today. [name]'s desired weight loss is 129 lbs. He has been heavy most of his life. He started gaining weight when he was diagnosed with sleep apnea. He is a picky eater and doesn't like to eat healthier foods. He skips lunch.  [name] feels his energy is lower than it should be. This has worsened with weight gain and has worsened recently. [name] admits to daytime somnolence and admits to waking up still tired. Patient is at risk for obstructive sleep apnea. Patent has a history of symptoms of daytime Brendan, Epworth sleepiness scale and morning headache. Patient generally gets 3 hours of sleep per night, and states they generally do not sleep well most nights. Snoring is present. Apneic episodes are present. Epworth Sleepiness Score is 14.  Dyspnea on exertion [name] notes increasing shortness of breath with exercising and seems to be worsening over time with weight gain. He  notes getting out of breath sooner with activity than he used to. This has gotten worse recently. [name] denies orthopnea.  Depression Screen [name] Food and Mood (modified PHQ-9) score was 9. Depression screen PHQ 2/9 07/16/2019  Decreased Interest 1  Down, Depressed, Hopeless 3  PHQ - 2 Score 4  Altered sleeping 0  Tired, decreased energy 1  Change in appetite 0  Feeling bad or failure about yourself  1  Trouble concentrating 0  Moving slowly or fidgety/restless 0  Suicidal thoughts 3  PHQ-9 Score 9  Difficult doing work/chores Extremely dIfficult  Some encounter information is confidential and restricted. Go to Review Flowsheets activity to see all data.   Assessment/Plan:   1. Other Brendan   2. Shortness of breath on exertion. IC was done and this was reviewed and interpreted. His dietary plan will be based on the IC.   3. Type 2 diabetes mellitus without complication, without long-term current use of insulin ([name]), and is taking Glucophage. Last A1c 6.5 on 03/05/2019.  We reviewed [name]'s A1c (old labs) and reviewed note from 03/13/2019 from Internal Medicine regarding his type II diabetes mellitus. He will continue metformin.    4. Essential hypertension and is taking Norvasc, Lopressor, and Benicar, which is well controlled. [name] was advised to continue his medications as prescribed.   5. OSA on CPAP. [name] reports wearing his CPAP machine but states he is not well rested. He was advised to continue to wear his CPAP machine.   6. Male hypogonadism, for which he is on testosterone  gel. He reports this is stable with increased energy.  We discussed the implications of male hypogonadism on weight. He will discuss with Endocrinology.   7. Vitamin D deficiency. He denies nausea, vomiting, or muscle weakness. He will have Vitamin D level checked.   8. Pain in both knees, unspecified chronicity. He periodically has injections in his knees which offers some relief.  We reviewed Dr.  Stefanie Libel' note (Sports Medicine). He will continue tramadol.    9. Depression screening    10. At risk for osteoporosis.  [name] was given extended (15 minutes) osteoporosis prevention counseling today. [name] is at risk for osteopenia and osteoporosis due to his Vitamin D deficiency. He was encouraged to take his Vitamin D and follow his higher calcium diet and increase strengthening exercise to help strengthen his bones and decrease his risk of osteopenia and osteoporosis.    11. Class 3 severe obesity with serious comorbidity and body mass index (BMI) of 45.0 to 49.9 in adult, unspecified obesity type ([name])    Brendan [name] was informed that his Brendan may be related to obesity, depression or many other causes. Labs will be ordered, and in the meanwhile [name] has agreed to work on diet, exercise and weight loss to help with Brendan. Proper sleep hygiene was discussed including the need for 7-8 hours of quality sleep each night. A sleep study was not ordered based on symptoms and Epworth score.  Dyspnea on exertion [name]'s shortness of breath appears to be obesity related and exercise induced. He has agreed to work on weight loss and gradually increase exercise to treat his exercise induced shortness of breath. If [name] follows our instructions and loses weight without improvement of his shortness of breath, we will plan to refer to pulmonology. We will monitor this condition regularly. [name] agrees to this plan.  Depression Screen [name] had a mildly positive depression screening. Depression is commonly associated with obesity and often results in emotional eating behaviors. We will monitor this closely and work on CBT to help improve the non-hunger eating patterns. Referral to Psychology may be required if no improvement is seen as he continues in our clinic.  Obesity [name] is currently in the action stage of change and his goal is to continue with weight loss efforts. I recommend [name] begin the  structured treatment plan as follows:  He has agreed to follow the Category 4 Plan + 200 calories with additional breakfast options.  We reviewed his previous labs (CBC and testosterone) and A1c. We reviewed his EKG today (no previous EKG on record). He will work on meal planning.  We discussed the following exercise goals today: For substantial health benefits, adults should do at least 150 minutes (2 hours and 30 minutes) a week of moderate-intensity, or 75 minutes (1 hour and 15 minutes) a week of vigorous-intensity aerobic physical activity, or an equivalent combination of moderate- and vigorous-intensity aerobic activity. Aerobic activity should be performed in episodes of at least 10 minutes, and preferably, it should be spread throughout the week. Adults should also include muscle-strengthening activities that involve all major muscle groups on 2 or more days a week.. We discussed the following behavioral modification strategies today: increasing lean protein intake, decreasing simple carbohydrates , increasing vegetables, increasing water intake, decreasing eating out, no skipping meals, meal planning and cooking strategies, keeping healthy foods in the home, avoiding temptations and planning for success.  He was informed of the importance of frequent follow-up visits to maximize his success with intensive  lifestyle modifications for his multiple health conditions. He was informed we would discuss his lab results at his next visit unless there is a critical issue that needs to be addressed sooner. [name] agreed to keep his next visit at the agreed upon time to discuss these results.  Objective:   Blood pressure 114/78, pulse 82, temperature 98.6 F (37 C), height 6\' 2"  (1.88 m), weight (!) 359 lb (162.8 kg), SpO2 97 %. Body mass index is 46.09 kg/m.   EKG: Sinus  Rhythm with a rate of 80 BPM. Low voltage in precordial leads which is most likely related to body habitus. Otherwise normal.    Indirect Calorimeter completed today shows a VO2 of 381 and a REE of 2656.  His calculated basal metabolic rate is 0000000 thus his basal metabolic rate is less than expected.  General: Cooperative, alert, well developed, in no acute distress. HEENT: Conjunctivae and lids unremarkable. Neck: No thyromegaly.  Cardiovascular: Regular rhythm.  Lungs: Normal work of breathing. Extremities: No edema.  Neurologic: No focal deficits.   Lab Results  Component Value Date   CREATININE 0.92 11/27/2018   BUN 13 11/27/2018   NA 137 11/27/2018   K 4.2 11/27/2018   CL 102 11/27/2018   CO2 28 11/27/2018   Lab Results  Component Value Date   ALT 38 11/27/2018   AST 20 11/27/2018   ALKPHOS 52 11/27/2018   BILITOT 0.5 11/27/2018   Lab Results  Component Value Date   HGBA1C 6.5 03/05/2019   HGBA1C 6.7 (H) 11/30/2018   HGBA1C 5.9 12/15/2017   HGBA1C 5.9 09/16/2017   HGBA1C 5.7 03/15/2017   No results found for: INSULIN Lab Results  Component Value Date   TSH 1.78 12/15/2017   Lab Results  Component Value Date   CHOL 165 12/15/2017   HDL 39.90 12/15/2017   LDLCALC 113 (H) 12/15/2017   TRIG 61.0 12/15/2017   CHOLHDL 4 12/15/2017   Lab Results  Component Value Date   WBC 7.0 06/20/2019   HGB 12.2 (L) 06/20/2019   HCT 38.7 (L) 06/20/2019   MCV 82.3 06/20/2019   PLT 245.0 06/20/2019   No results found for: IRON, TIBC, FERRITIN Obesity Behavioral Intervention Visit Documentation for Insurance (15 Minutes):   ASK: We discussed the diagnosis of obesity with [name] today and Janorris agreed to give [place] permission to discuss obesity behavioral modification therapy today.  ASSESS: Shmar has the diagnosis of obesity and his BMI today is 46.1. Nobel is in the action stage of change.   ADVISE: Hurston was educated on the multiple health risks of obesity as well as the benefit of weight loss to improve his health. He was advised of the need for long term treatment and the importance of  lifestyle modifications to improve his current health and to decrease his risk of future health problems.  AGREE: Multiple dietary modification options and treatment options were discussed and Mclane agreed to follow the recommendations documented in the above note.  ARRANGE: Khiry was educated on the importance of frequent visits to treat obesity as outlined per CMS and USPSTF guidelines and agreed to schedule his next follow up appointment today.  Attestation Statements:   Reviewed by clinician on day of visit: allergies, medications, problem list, medical history, surgical history, family history, social history and previous encounter notes.  This visit occurred during the SARS-CoV-2 public health emergency.  Safety protocols were in place, including screening questions prior to the visit, additional usage of staff PPE, and  extensive cleaning of exam room while observing appropriate contact time as indicated for disinfecting solutions. (CPT [id number])  I, Michaelene Song, am acting as transcriptionist for [employer], DO  I have reviewed the above documentation for accuracy and completeness, and I agree with the above. Jearld Lesch, DO

## 2019-07-17 ENCOUNTER — Encounter (INDEPENDENT_AMBULATORY_CARE_PROVIDER_SITE_OTHER): Payer: Self-pay | Admitting: Bariatrics

## 2019-07-17 DIAGNOSIS — E538 Deficiency of other specified B group vitamins: Secondary | ICD-10-CM | POA: Insufficient documentation

## 2019-07-17 LAB — COMPREHENSIVE METABOLIC PANEL
ALT: 19 IU/L (ref 0–44)
AST: 16 IU/L (ref 0–40)
Albumin/Globulin Ratio: 1.6 (ref 1.2–2.2)
Albumin: 4.6 g/dL (ref 3.8–4.9)
Alkaline Phosphatase: 53 IU/L (ref 39–117)
BUN/Creatinine Ratio: 11 (ref 9–20)
BUN: 10 mg/dL (ref 6–24)
Bilirubin Total: 0.7 mg/dL (ref 0.0–1.2)
CO2: 27 mmol/L (ref 20–29)
Calcium: 9.8 mg/dL (ref 8.7–10.2)
Chloride: 100 mmol/L (ref 96–106)
Creatinine, Ser: 0.9 mg/dL (ref 0.76–1.27)
GFR calc Af Amer: 112 mL/min/{1.73_m2} (ref >59)
GFR calc non Af Amer: 96 mL/min/{1.73_m2} (ref >59)
Globulin, Total: 2.8 g/dL (ref 1.5–4.5)
Glucose: 110 mg/dL — ABNORMAL HIGH (ref 65–99)
Potassium: 4.4 mmol/L (ref 3.5–5.2)
Sodium: 139 mmol/L (ref 134–144)
Total Protein: 7.4 g/dL (ref 6.0–8.5)

## 2019-07-17 LAB — VITAMIN D 25 HYDROXY (VIT D DEFICIENCY, FRACTURES): Vit D, 25-Hydroxy: 10.9 ng/mL — ABNORMAL LOW (ref 30.0–100.0)

## 2019-07-17 LAB — VITAMIN B12: Vitamin B-12: 119 pg/mL — ABNORMAL LOW (ref 232–1245)

## 2019-07-17 LAB — INSULIN, RANDOM: INSULIN: 18.2 u[IU]/mL (ref 2.6–24.9)

## 2019-07-17 LAB — T3: T3, Total: 124 ng/dL (ref 71–180)

## 2019-07-17 LAB — TSH: TSH: 1.16 u[IU]/mL (ref 0.450–4.500)

## 2019-07-17 LAB — HEMOGLOBIN A1C
Est. average glucose Bld gHb Est-mCnc: 134 mg/dL
Hgb A1c MFr Bld: 6.3 % — ABNORMAL HIGH (ref 4.8–5.6)

## 2019-07-17 LAB — T4, FREE: Free T4: 1.12 ng/dL (ref 0.82–1.77)

## 2019-07-17 NOTE — Progress Notes (Signed)
Pt advised.

## 2019-07-18 ENCOUNTER — Encounter (INDEPENDENT_AMBULATORY_CARE_PROVIDER_SITE_OTHER): Payer: Self-pay | Admitting: Bariatrics

## 2019-07-30 ENCOUNTER — Encounter (INDEPENDENT_AMBULATORY_CARE_PROVIDER_SITE_OTHER): Payer: Self-pay | Admitting: Bariatrics

## 2019-07-30 ENCOUNTER — Other Ambulatory Visit: Payer: Self-pay

## 2019-07-30 ENCOUNTER — Ambulatory Visit (INDEPENDENT_AMBULATORY_CARE_PROVIDER_SITE_OTHER): Payer: BC Managed Care – PPO | Admitting: Bariatrics

## 2019-07-30 VITALS — BP 118/83 | HR 93 | Temp 98.6°F | Ht 74.0 in | Wt 359.0 lb

## 2019-07-30 DIAGNOSIS — E559 Vitamin D deficiency, unspecified: Secondary | ICD-10-CM

## 2019-07-30 DIAGNOSIS — E119 Type 2 diabetes mellitus without complications: Secondary | ICD-10-CM | POA: Diagnosis not present

## 2019-07-30 DIAGNOSIS — I10 Essential (primary) hypertension: Secondary | ICD-10-CM

## 2019-07-30 DIAGNOSIS — E538 Deficiency of other specified B group vitamins: Secondary | ICD-10-CM

## 2019-07-30 DIAGNOSIS — Z6841 Body Mass Index (BMI) 40.0 and over, adult: Secondary | ICD-10-CM

## 2019-07-30 MED ORDER — VITAMIN D (ERGOCALCIFEROL) 1.25 MG (50000 UNIT) PO CAPS: 50000.0000 [IU] | ORAL_CAPSULE | ORAL | 0 refills | Status: DC

## 2019-07-30 NOTE — Progress Notes (Signed)
Chief Complaint:   OBESITY [name] is here to discuss his progress with his obesity treatment plan along with follow-up of his obesity related diagnoses. [name] is currently not following a plan. [name] states he is exercising 0 minutes 0 times per week.  Today's visit was #: 2 Starting weight: 359 lbs Starting date: 07/16/2019  Interim History: [name] did not weigh in today. He needs to lose weight in order to have Orthopedic knee surgery. He did not pay attention to the diet.  Subjective:   Type 2 diabetes mellitus without complication, without long-term current use of insulin ([name]). [place] is taking metformin. He states fasting blood sugars range between 100 and 120. Last insulin 18.2 on 07/16/2019.  Lab Results  Component Value Date   HGBA1C 6.3 (H) 07/16/2019   HGBA1C 6.5 03/05/2019   HGBA1C 6.7 (H) 11/30/2018   Lab Results  Component Value Date   MICROALBUR <0.7 03/15/2017   LDLCALC 113 (H) 12/15/2017   CREATININE 0.90 07/16/2019    Essential hypertension. [name] is taking Norvasc and Lopressor.  BP Readings from Last 3 Encounters:  07/30/19 118/83  07/16/19 114/78  06/25/19 120/78   Lab Results  Component Value Date   CREATININE 0.90 07/16/2019   CREATININE 0.92 11/27/2018   CREATININE 0.87 01/16/2018   B12 nutritional deficiency. B12 level 119. Phone call to patient to begin B12 OTC (taking metformin).  Vitamin D deficiency. Last Vitamin D level 10.9 on 07/16/2019. [name] is taking Vitamin D 1,000 twice daily.  Assessment/Plan:   Type 2 diabetes mellitus without complication, without long-term current use of insulin ([place]). Good blood sugar control is important to decrease the likelihood of diabetic complications such as nephropathy, neuropathy, limb loss, blindness, coronary artery disease, and death. Intensive lifestyle modification including diet, exercise and weight loss are the first line of treatment for diabetes. [name] will continue his medications, decrease  carbohydrates, and increase protein and healthy fats.  Essential hypertension. [name] is working on healthy weight loss and exercise to improve blood pressure control. We will watch for signs of hypotension as he continues his lifestyle modifications. He will continue his medications, decrease sodium, and will rinse canned foods.  B12 nutritional deficiency. The diagnosis was reviewed with the patient. Counseling provided today, see below. We will continue to monitor. Orders and follow up as documented in patient record. [name] has been taking B12 1,000 mcg daily OTC and will continue.  Counseling . The body needs vitamin B12: to make red blood cells; to make DNA; and to help the nerves work properly so they can carry messages from the brain to the body.  . The main causes of vitamin B12 deficiency include dietary deficiency, digestive diseases, pernicious anemia, and having a surgery in which part of the stomach or small intestine is removed.  . Certain medicines can make it harder for the body to absorb vitamin B12. These medicines include: heartburn medications; some antibiotics; some medications used to treat diabetes, gout, and high cholesterol.  . In some cases, there are no symptoms of this condition. If the condition leads to anemia or nerve damage, various symptoms can occur, such as weakness or fatigue, shortness of breath, and numbness or tingling in your hands and feet.   . Treatment:  o May include taking vitamin B12 supplements.  o Avoid alcohol.  o Eat lots of healthy foods that contain vitamin B12: - Beef, pork, chicken, [place], and organ meats, such as liver.  - Seafood: This includes clams,  rainbow trout, salmon, tuna, and haddock. Eggs.  - Cereal and dairy products that are fortified: This means that vitamin B12 has been added to the food.    Vitamin D deficiency. Low Vitamin D level contributes to fatigue and are associated with obesity, breast, and colon cancer. He agrees to take  prescription Vitamin D, Ergocalciferol, (DRISDOL) 1.25 MG (50000 UNIT) CAPS capsule every week #4 with 0 refills and will follow-up for routine testing of Vitamin D, at least 2-3 times per year to avoid over-replacement.    Class 3 severe obesity with serious comorbidity and body mass index (BMI) of 45.0 to 49.9 in adult, unspecified obesity type ([name]).  [name] is not currently in the action stage of change. As such, his goal is to continue with weight loss efforts. He has agreed to the Category 4 Plan + 200 calories.  We reviewed his CMP, Vitamin D, Vitamin B12, A1c and insulin.  He will work on meal planning.  Exercise goals: None.  Behavioral modification strategies: increasing lean protein intake, decreasing simple carbohydrates, increasing vegetables, increasing water intake, decreasing eating out, no skipping meals, meal planning and cooking strategies and keeping healthy foods in the home.  [name] will follow-up at the [facility]. He is aware that he can come back (make an appointment) within 6 months without having another initial visit.  Objective:   Blood pressure 118/83, pulse 93, temperature 98.6 F (37 C), height 6\' 2"  (1.88 m), SpO2 98 %. Body mass index is 46.09 kg/m.  General: Cooperative, alert, well developed, in no acute distress. HEENT: Conjunctivae and lids unremarkable. Cardiovascular: Regular rhythm.  Lungs: Normal work of breathing. Neurologic: No focal deficits.   Lab Results  Component Value Date   CREATININE 0.90 07/16/2019   BUN 10 07/16/2019   NA 139 07/16/2019   K 4.4 07/16/2019   CL 100 07/16/2019   CO2 27 07/16/2019   Lab Results  Component Value Date   ALT 19 07/16/2019   AST 16 07/16/2019   ALKPHOS 53 07/16/2019   BILITOT 0.7 07/16/2019   Lab Results  Component Value Date   HGBA1C 6.3 (H) 07/16/2019   HGBA1C 6.5 03/05/2019   HGBA1C 6.7 (H) 11/30/2018   HGBA1C 5.9 12/15/2017   HGBA1C 5.9 09/16/2017   Lab Results  Component Value Date    INSULIN 18.2 07/16/2019   Lab Results  Component Value Date   TSH 1.160 07/16/2019   Lab Results  Component Value Date   CHOL 165 12/15/2017   HDL 39.90 12/15/2017   LDLCALC 113 (H) 12/15/2017   TRIG 61.0 12/15/2017   CHOLHDL 4 12/15/2017   Lab Results  Component Value Date   WBC 7.0 06/20/2019   HGB 12.2 (L) 06/20/2019   HCT 38.7 (L) 06/20/2019   MCV 82.3 06/20/2019   PLT 245.0 06/20/2019   No results found for: IRON, TIBC, FERRITIN  Attestation Statements:   Reviewed by clinician on day of visit: allergies, medications, problem list, medical history, surgical history, family history, social history, and previous encounter notes.  Migdalia Dk, am acting as Location manager for [employer], DO   I have reviewed the above documentation for accuracy and completeness, and I agree with the above. Jearld Lesch, DO

## 2019-07-31 ENCOUNTER — Encounter (INDEPENDENT_AMBULATORY_CARE_PROVIDER_SITE_OTHER): Payer: Self-pay | Admitting: Bariatrics

## 2019-08-09 ENCOUNTER — Encounter: Payer: Self-pay | Admitting: Podiatry

## 2019-08-09 ENCOUNTER — Ambulatory Visit: Payer: BC Managed Care – PPO | Admitting: Podiatry

## 2019-08-09 ENCOUNTER — Other Ambulatory Visit: Payer: Self-pay

## 2019-08-09 DIAGNOSIS — M778 Other enthesopathies, not elsewhere classified: Secondary | ICD-10-CM

## 2019-08-09 NOTE — Progress Notes (Signed)
[name] follows up today for follow-up of his capsulitis first metatarsophalangeal joint of the left foot.  States that is still hurting in the joint is just stiff is just and it seems like it is never gone to get better is limiting my ability perform a daily activities even plan with my grandchildren.  Objective: Vital signs are stable he is alert and oriented x3 there is no erythema there is moderate edema no cellulitis drainage or odor he has stiffness of the joint only about 5 to 10 degrees of dorsiflexion and plantar flexion combined.  Radiograph does not demonstrate any type of osseous abnormality in this area soft tissue swelling and scar tissue is visible on radiographs.  Assessment: Chronic pain first metatarsophalangeal joint left.  Plan: Encouraged him to apply Voltaren gel on a daily basis multiple times a day and also encouraged him that we will continue to keep him out of work until he gets a good thorough range of motion or until disability x3.  I will follow-up with him in 3 to 4 months.

## 2019-08-16 ENCOUNTER — Other Ambulatory Visit: Payer: Self-pay | Admitting: Internal Medicine

## 2019-09-05 ENCOUNTER — Telehealth: Payer: Self-pay | Admitting: Internal Medicine

## 2019-09-05 MED ORDER — OMEPRAZOLE 40 MG PO CPDR: DELAYED_RELEASE_CAPSULE | ORAL | 0 refills | Status: DC

## 2019-09-05 NOTE — Telephone Encounter (Signed)
Sent requested refill to pharmacy

## 2019-09-05 NOTE — Telephone Encounter (Signed)
Medication Requested:omeprazole (PRILOSEC) 40 MG capsule   Is medication on med list: Yes (if no, inform pt they may need an appointment)  Is medication a controled: No (yes = last OV with PCP)  -Controlled Substances: Adderall, Ritalin, oxycodone, hydrocodone, methadone, alprazolam, etc  Last visit with PCP: 05/14/19  Is the OV > than 4 months: No (yes = schedule an appt if one is not already made)  Pharmacy (Name, Street, City):[id number] [place], [place]

## 2019-09-07 ENCOUNTER — Telehealth: Payer: Self-pay | Admitting: *Deleted

## 2019-09-07 MED ORDER — GABAPENTIN 300 MG PO CAPS: ORAL_CAPSULE | ORAL | 2 refills | Status: AC

## 2019-09-07 NOTE — Addendum Note (Signed)
Addended by: Graceann Congress D on: 09/07/2019 04:52 PM   Modules accepted: Orders

## 2019-09-07 NOTE — Telephone Encounter (Signed)
Per Dr. Milinda Pointer, ok to give refill for Gabapentin.  Script has been sent to pharmacy and patient has been notified via voice mail

## 2019-09-07 NOTE — Telephone Encounter (Signed)
Patient is requesting a prescription for his Neuropathy pain.  His last refill was sent in for Gabapentin on 06/05/19.

## 2019-09-24 ENCOUNTER — Other Ambulatory Visit: Payer: Self-pay | Admitting: Internal Medicine

## 2019-09-24 NOTE — Telephone Encounter (Signed)
Please refill as per office routine med refill policy (all routine meds refilled for 3 mo or monthly per pt preference up to one year from last visit, then month to month grace period for 3 mo, then further med refills will have to be denied)  

## 2019-10-26 ENCOUNTER — Telehealth: Payer: Self-pay | Admitting: Endocrinology

## 2019-10-26 ENCOUNTER — Other Ambulatory Visit: Payer: Self-pay | Admitting: Endocrinology

## 2019-10-26 MED ORDER — TESTOSTERONE 20.25 MG/ACT (1.62%) TD GEL: TRANSDERMAL | 1 refills | Status: DC

## 2019-10-26 NOTE — Telephone Encounter (Signed)
Called pt for clarificatin, pt is requesting a refill of testosterone for 90 day supply.

## 2019-10-26 NOTE — Telephone Encounter (Signed)
Done

## 2019-10-26 NOTE — Telephone Encounter (Signed)
Have you contacted your pharmacy to initiate the refill request? Yes. Patient states that pharmacy only has one for him and that this is not for a 30 day supply  What is the name of the medication/medications? Testosterone  Is this for a 90 day? NO, 30 day   What is the name and location of the pharmacy you would like to use?  [facility] [address] [facility] [place]

## 2019-11-06 ENCOUNTER — Other Ambulatory Visit: Payer: Self-pay | Admitting: Internal Medicine

## 2019-11-08 ENCOUNTER — Other Ambulatory Visit: Payer: Self-pay | Admitting: Internal Medicine

## 2019-11-16 ENCOUNTER — Other Ambulatory Visit: Payer: Self-pay

## 2019-11-16 ENCOUNTER — Ambulatory Visit (INDEPENDENT_AMBULATORY_CARE_PROVIDER_SITE_OTHER): Payer: BC Managed Care – PPO | Admitting: Internal Medicine

## 2019-11-16 ENCOUNTER — Encounter: Payer: Self-pay | Admitting: Internal Medicine

## 2019-11-16 VITALS — BP 110/80 | HR 87 | Temp 98.6°F | Ht 74.0 in | Wt 368.0 lb

## 2019-11-16 DIAGNOSIS — M545 Low back pain, unspecified: Secondary | ICD-10-CM

## 2019-11-16 DIAGNOSIS — I10 Essential (primary) hypertension: Secondary | ICD-10-CM

## 2019-11-16 DIAGNOSIS — F32A Depression, unspecified: Secondary | ICD-10-CM

## 2019-11-16 DIAGNOSIS — Z0000 Encounter for general adult medical examination without abnormal findings: Secondary | ICD-10-CM

## 2019-11-16 DIAGNOSIS — E559 Vitamin D deficiency, unspecified: Secondary | ICD-10-CM | POA: Diagnosis not present

## 2019-11-16 DIAGNOSIS — E119 Type 2 diabetes mellitus without complications: Secondary | ICD-10-CM

## 2019-11-16 DIAGNOSIS — F329 Major depressive disorder, single episode, unspecified: Secondary | ICD-10-CM | POA: Diagnosis not present

## 2019-11-16 DIAGNOSIS — N529 Male erectile dysfunction, unspecified: Secondary | ICD-10-CM

## 2019-11-16 DIAGNOSIS — E538 Deficiency of other specified B group vitamins: Secondary | ICD-10-CM | POA: Diagnosis not present

## 2019-11-16 DIAGNOSIS — Z23 Encounter for immunization: Secondary | ICD-10-CM | POA: Diagnosis not present

## 2019-11-16 DIAGNOSIS — Z0001 Encounter for general adult medical examination with abnormal findings: Secondary | ICD-10-CM | POA: Diagnosis not present

## 2019-11-16 LAB — CBC WITH DIFFERENTIAL/PLATELET
Basophils Absolute: 0 10*3/uL (ref 0.0–0.1)
Basophils Relative: 0.4 % (ref 0.0–3.0)
Eosinophils Absolute: 0.1 10*3/uL (ref 0.0–0.7)
Eosinophils Relative: 1.4 % (ref 0.0–5.0)
HCT: 41.6 % (ref 39.0–52.0)
Hemoglobin: 13.5 g/dL (ref 13.0–17.0)
Lymphocytes Relative: 34.1 % (ref 12.0–46.0)
Lymphs Abs: 2.3 10*3/uL (ref 0.7–4.0)
MCHC: 32.4 g/dL (ref 30.0–36.0)
MCV: 80.7 fl (ref 78.0–100.0)
Monocytes Absolute: 0.6 10*3/uL (ref 0.1–1.0)
Monocytes Relative: 8.7 % (ref 3.0–12.0)
Neutro Abs: 3.8 10*3/uL (ref 1.4–7.7)
Neutrophils Relative %: 55.4 % (ref 43.0–77.0)
Platelets: 189 10*3/uL (ref 150.0–400.0)
RBC: 5.16 Mil/uL (ref 4.22–5.81)
RDW: 14.2 % (ref 11.5–15.5)
WBC: 6.8 10*3/uL (ref 4.0–10.5)

## 2019-11-16 LAB — URINALYSIS, ROUTINE W REFLEX MICROSCOPIC
Bilirubin Urine: NEGATIVE
Hgb urine dipstick: NEGATIVE
Ketones, ur: NEGATIVE
Leukocytes,Ua: NEGATIVE
Nitrite: NEGATIVE
RBC / HPF: NONE SEEN (ref 0–?)
Specific Gravity, Urine: 1.02 (ref 1.000–1.030)
Total Protein, Urine: NEGATIVE
Urine Glucose: NEGATIVE
Urobilinogen, UA: 0.2 (ref 0.0–1.0)
pH: 6 (ref 5.0–8.0)

## 2019-11-16 LAB — HEPATIC FUNCTION PANEL
ALT: 36 U/L (ref 0–53)
AST: 18 U/L (ref 0–37)
Albumin: 4.5 g/dL (ref 3.5–5.2)
Alkaline Phosphatase: 56 U/L (ref 39–117)
Bilirubin, Direct: 0.2 mg/dL (ref 0.0–0.3)
Total Bilirubin: 0.9 mg/dL (ref 0.2–1.2)
Total Protein: 7.7 g/dL (ref 6.0–8.3)

## 2019-11-16 LAB — LIPID PANEL
Cholesterol: 120 mg/dL (ref 0–200)
HDL: 34 mg/dL — ABNORMAL LOW (ref >39.00)
LDL Cholesterol: 74 mg/dL (ref 0–99)
NonHDL: 86.49
Total CHOL/HDL Ratio: 4
Triglycerides: 62 mg/dL (ref 0.0–149.0)
VLDL: 12.4 mg/dL (ref 0.0–40.0)

## 2019-11-16 LAB — MICROALBUMIN / CREATININE URINE RATIO
Creatinine,U: 174.9 mg/dL
Microalb Creat Ratio: 0.4 mg/g (ref 0.0–30.0)
Microalb, Ur: <0.7 mg/dL (ref 0.0–1.9)

## 2019-11-16 LAB — VITAMIN D 25 HYDROXY (VIT D DEFICIENCY, FRACTURES): VITD: 28.7 ng/mL — ABNORMAL LOW (ref 30.00–100.00)

## 2019-11-16 LAB — BASIC METABOLIC PANEL
BUN: 14 mg/dL (ref 6–23)
CO2: 30 mEq/L (ref 19–32)
Calcium: 9.3 mg/dL (ref 8.4–10.5)
Chloride: 100 mEq/L (ref 96–112)
Creatinine, Ser: 0.98 mg/dL (ref 0.40–1.50)
GFR: 96.09 mL/min (ref >60.00)
Glucose, Bld: 131 mg/dL — ABNORMAL HIGH (ref 70–99)
Potassium: 4 mEq/L (ref 3.5–5.1)
Sodium: 136 mEq/L (ref 135–145)

## 2019-11-16 LAB — PSA: PSA: 0.17 ng/mL (ref 0.10–4.00)

## 2019-11-16 LAB — VITAMIN B12: Vitamin B-12: 448 pg/mL (ref 211–911)

## 2019-11-16 LAB — TSH: TSH: 1.43 u[IU]/mL (ref 0.35–4.50)

## 2019-11-16 LAB — HEMOGLOBIN A1C: Hgb A1c MFr Bld: 8.1 % — ABNORMAL HIGH (ref 4.6–6.5)

## 2019-11-16 MED ORDER — CYCLOBENZAPRINE HCL 5 MG PO TABS: 5.0000 mg | ORAL_TABLET | Freq: Three times a day (TID) | ORAL | 5 refills | Status: AC | PRN

## 2019-11-16 MED ORDER — PHENTERMINE HCL 37.5 MG PO CAPS: 37.5000 mg | ORAL_CAPSULE | ORAL | 5 refills | Status: DC

## 2019-11-16 MED ORDER — TADALAFIL 20 MG PO TABS: 10.0000 mg | ORAL_TABLET | ORAL | 11 refills | Status: DC | PRN

## 2019-11-16 NOTE — Patient Instructions (Signed)
Ok to change the viagra to cialis  Please take all new medication as prescribed - the phentermine to help with wt loss, so that hopefully you can eventually get your knee surgury  Please continue all other medications as before, and refills have been done if requested.  Please have the pharmacy call with any other refills you may need.  Please continue your efforts at being more active, low cholesterol diet, and weight control.  You are otherwise up to date with prevention measures today.  Please keep your appointments with your specialists as you may have planned  Please go to the LAB at the blood drawing area for the tests to be done You will be contacted by phone if any changes need to be made immediately.  Otherwise, you will receive a letter about your results with an explanation, but please check with MyChart first.  Please remember to sign up for MyChart if you have not done so, as this will be important to you in the future with finding out test results, communicating by private email, and scheduling acute appointments online when needed.  Please make an Appointment to return in 6 months, or sooner if needed

## 2019-11-16 NOTE — Progress Notes (Signed)
Subjective:    Patient ID: [name], male    DOB: 01-18-1966, 54 y.o.   MRN: [id number]  HPI  Here for wellness and f/u;  Overall doing ok;  Pt denies Chest pain, worsening SOB, DOE, wheezing, orthopnea, PND, worsening LE edema, palpitations, dizziness or syncope.  Pt denies neurological change such as new headache, facial or extremity weakness.  Pt denies polydipsia, polyuria, or low sugar symptoms. Pt states overall good compliance with treatment and medications, good tolerability, and has been trying to follow appropriate diet.  Pt denies worsening depressive symptoms, suicidal ideation or panic. No fever, night sweats, wt loss, loss of appetite, or other constitutional symptoms.  Pt states good ability with ADL's, has low fall risk, home safety reviewed and adequate, no other significant changes in hearing or vision, and only occasionally active with exercise due to knee pain.  Now 100% [employer] and full disaibility for Soc Sec.disability, no work for 15 mo.  S/p COVID in oct 2020.  Did have ortho visit yest with cortisone, needs to get wt down to BMI 30 (about 310 lbs), looking fairly hopeless at this point to him.  Pt continues to have recurring LBP without change in severity, bowel or bladder change, fever, wt loss,  worsening LE pain/numbness/weakness, gait change or falls.  Asks to change the viagra to cialis as this seemd to work better, hard to keep partner satisfied.  Follows with endo for dm Past Medical History:  Diagnosis Date  . Acid reflux   . Alcohol abuse   . Anxiety   . Arthritis   . Depression 07/08/2015  . Diabetes mellitus ([place])   . Diabetes mellitus, type II ([place])   . DJD (degenerative joint disease)   . Gallbladder problem   . Gallstones   . GERD (gastroesophageal reflux disease)   . Gout   . HTN (hypertension)   . Hyperglycemia   . Hyperlipidemia   . IBS (irritable bowel syndrome)   . Joint pain   . Low testosterone in male   . Male hypogonadism 07/08/2015  . Obesity    . Osteoarthritis   . PONV (postoperative nausea and vomiting)   . Sleep apnea    Past Surgical History:  Procedure Laterality Date  . Back Cyst Excision     x2  . CHOLECYSTECTOMY  08/11/2011   Procedure: LAPAROSCOPIC CHOLECYSTECTOMY WITH INTRAOPERATIVE CHOLANGIOGRAM;  Surgeon: Judieth Keens, DO;  Location: [facility];  Service: General;  Laterality: N/A;  Laparoscopic cholecystectomy with cholangiogram.  . COLONOSCOPY WITH PROPOFOL N/A 09/15/2015   Procedure: COLONOSCOPY WITH PROPOFOL;  Surgeon: Gatha Mayer, MD;  Location: [facility];  Service: Endoscopy;  Laterality: N/A;  . JOINT REPLACEMENT  01/26/2011   right knee  . KNEE ARTHROSCOPY     x 2 left  . KNEE ARTHROSCOPY     x 2 right  . REPAIR KNEE LIGAMENT     Left  . RIGHT FOOT     BONES STRAIGHTENED OUT   2006  . Shoulder Surgery, Reconstruction of Joint     Left x 3, Last 02/2007  . Tennis elbow surgery, Nerve entrapment     x2 right    reports that he has never smoked. He has never used smokeless tobacco. He reports that he does not drink alcohol or use drugs. family history includes Colon cancer (age of onset: 17) in his brother; High Cholesterol in his mother; Obesity in his mother; Ovarian cancer in his maternal grandmother; Sarcoidosis in his mother.  No Known Allergies Current Outpatient Medications on File Prior to Visit  Medication Sig Dispense Refill  . amLODipine (NORVASC) 5 MG tablet TAKE 1 TABLET BY MOUTH EVERY DAY 90 tablet 1  . atorvastatin (LIPITOR) 20 MG tablet Take 20 mg by mouth daily.    . benzonatate (TESSALON PERLES) 100 MG capsule Take 1 capsule (100 mg total) by mouth 3 (three) times daily as needed for cough. 60 capsule 0  . buPROPion (WELLBUTRIN SR) 150 MG 12 hr tablet Take 150 mg by mouth daily.    . busPIRone (BUSPAR) 10 MG tablet Take 10 mg by mouth 3 (three) times daily.    . busPIRone (BUSPAR) 15 MG tablet Take 15 mg by mouth at bedtime.    . celecoxib (CELEBREX) 200 MG capsule Take 1 capsule  (200 mg total) by mouth 2 (two) times daily. 60 capsule 2  . cromolyn (OPTICROM) 4 % ophthalmic solution INSTILL 1 DROP INTO EACH EYE 4 TIMES DAILY AT REGULAR INTERVALS.    [name] diclofenac sodium (VOLTAREN) 1 % GEL Apply 4 g topically 4 (four) times daily as needed. 400 g 1  . dicyclomine (BENTYL) 10 MG capsule     . DULoxetine (CYMBALTA) 30 MG capsule Take 30 mg by mouth daily.    . fluticasone (FLONASE) 50 MCG/ACT nasal spray SPRAY 2 SPRAYS INTO EACH NOSTRIL EVERY DAY 48 mL 2  . gabapentin (NEURONTIN) 300 MG capsule Take one AM, one PM, two QHS 240 capsule 2  . HYDROcodone-acetaminophen (NORCO/VICODIN) 5-325 MG tablet Take 1 tablet by mouth every 6 (six) hours as needed for moderate pain. 40 tablet 0  . indomethacin (INDOCIN) 50 MG capsule TAKE ONE CAPSULE EVERY 6 HOURS AS NEEDED GOUT ONLY 60 capsule 2  . loperamide (IMODIUM) 2 MG capsule     . meloxicam (MOBIC) 15 MG tablet TAKE 1 TABLET (15 MG TOTAL) BY MOUTH DAILY. 90 tablet 3  . meloxicam (MOBIC) 15 MG tablet Take 1 tablet (15 mg total) by mouth daily. 30 tablet 3  . metFORMIN (GLUCOPHAGE) 500 MG tablet TAKE 2 TABLETS BY MOUTH TWICE A DAY 360 tablet 1  . metoprolol tartrate (LOPRESSOR) 25 MG tablet     . olmesartan (BENICAR) 20 MG tablet Take 1 tablet (20 mg total) by mouth daily. Annual appt due in Sept must see provider for future refills 90 tablet 1  . omeprazole (PRILOSEC) 40 MG capsule TAKE 1 CAPSULE BY MOUTH EVERY DAY 90 capsule 1  . ondansetron (ZOFRAN) 4 MG tablet Take 1 tablet (4 mg total) by mouth every 8 (eight) hours as needed for nausea or vomiting. 20 tablet 0  . oxyCODONE-acetaminophen (PERCOCET) 10-325 MG tablet Take 1 tablet by mouth every 8 (eight) hours as needed for pain. 20 tablet 0  . prazosin (MINIPRESS) 2 MG capsule Take 1 capsule (2 mg total) by mouth at bedtime. 30 capsule 1  . QUEtiapine (SEROQUEL) 25 MG tablet Take 25 mg by mouth at bedtime.    . Testosterone 20.25 MG/ACT (1.62%) GEL Use 2 pumps on each arm 450 g 1    . traZODone (DESYREL) 50 MG tablet Take 1 tablet (50 mg total) by mouth at bedtime. 30 tablet 1  . vardenafil (LEVITRA) 20 MG tablet Take 1 tablet (20 mg total) by mouth daily as needed for erectile dysfunction. 6 tablet 5  . Vitamin D, Ergocalciferol, (DRISDOL) 1.25 MG (50000 UNIT) CAPS capsule Take 1 capsule (50,000 Units total) by mouth every 7 (seven) days. 4 capsule 0  .  XIIDRA 5 % SOLN INSTILL 1 DROP INTO BOTH EYES TWICE A DAY    . escitalopram (LEXAPRO) 20 MG tablet Take 1 tablet (20 mg total) by mouth daily. 30 tablet 2  . hyoscyamine (LEVSIN SL) 0.125 MG SL tablet Place 1 tablet (0.125 mg total) under the tongue every 4 (four) hours as needed for up to 10 days. 30 tablet 5   No current facility-administered medications on file prior to visit.   Review of Systems All otherwise neg per pt     Objective:   Physical Exam BP 110/80 (BP Location: Left Arm, Patient Position: Sitting, Cuff Size: Large)   Pulse 87   Temp 98.6 F (37 C) (Oral)   Ht 6\' 2"  (1.88 m)   Wt (!) 368 lb (166.9 kg)   SpO2 97%   BMI 47.25 kg/m  VS noted,  Constitutional: Pt appears in NAD HENT: Head: NCAT.  Right Ear: External ear normal.  Left Ear: External ear normal.  Eyes: . Pupils are equal, round, and reactive to light. Conjunctivae and EOM are normal Nose: without d/c or deformity Neck: Neck supple. Gross normal ROM Cardiovascular: Normal rate and regular rhythm.   Pulmonary/Chest: Effort normal and breath sounds without rales or wheezing.  Abd:  Soft, NT, ND, + BS, no organomegaly Neurological: Pt is alert. At baseline orientation, motor grossly intact Skin: Skin is warm. No rashes, other new lesions, no LE edema Psychiatric: Pt behavior is normal without agitation  All otherwise neg per pt  Lab Results  Component Value Date   WBC 7.0 06/20/2019   HGB 12.2 (L) 06/20/2019   HCT 38.7 (L) 06/20/2019   PLT 245.0 06/20/2019   GLUCOSE 110 (H) 07/16/2019   CHOL 165 12/15/2017   TRIG 61.0  12/15/2017   HDL 39.90 12/15/2017   LDLCALC 113 (H) 12/15/2017   ALT 19 07/16/2019   AST 16 07/16/2019   NA 139 07/16/2019   K 4.4 07/16/2019   CL 100 07/16/2019   CREATININE 0.90 07/16/2019   BUN 10 07/16/2019   CO2 27 07/16/2019   TSH 1.160 07/16/2019   PSA 0.28 03/15/2017   INR 0.96 05/14/2011   HGBA1C 6.3 (H) 07/16/2019   MICROALBUR <0.7 03/15/2017       Assessment & Plan:

## 2019-11-17 ENCOUNTER — Encounter: Payer: Self-pay | Admitting: Internal Medicine

## 2019-11-17 NOTE — Assessment & Plan Note (Signed)
Currently stable,  to f/u any worsening symptoms or concerns

## 2019-11-17 NOTE — Assessment & Plan Note (Signed)
Ok for change viagra to cialis prn 

## 2019-11-17 NOTE — Assessment & Plan Note (Signed)
supermorbid obese, unable for knee surgury as needs to lose about 60 lbs; for phentermine asd, goal wt 300 lbs

## 2019-11-17 NOTE — Assessment & Plan Note (Signed)
stable overall by history and exam, recent data reviewed with pt, and pt to continue medical treatment as before,  to f/u any worsening symptoms or concerns  

## 2019-11-17 NOTE — Assessment & Plan Note (Signed)

## 2019-11-17 NOTE — Assessment & Plan Note (Addendum)
For change muscle relaxer to flexeril prn,  to f/u any worsening symptoms or concerns  I spent 31 minutes in addition to time for CPX wellness examination in preparing to see the patient by review of recent labs, imaging and procedures, obtaining and reviewing separately obtained history, communicating with the patient and family or caregiver, ordering medications, tests or procedures, and documenting clinical information in the EHR including the differential Dx, treatment, and any further evaluation and other management of lbp, obese, dm, depression, htn ED

## 2019-11-17 NOTE — Assessment & Plan Note (Signed)
To continue metformin, f/u a1c, f/u endo

## 2019-11-18 ENCOUNTER — Other Ambulatory Visit: Payer: Self-pay | Admitting: Internal Medicine

## 2019-11-18 DIAGNOSIS — E559 Vitamin D deficiency, unspecified: Secondary | ICD-10-CM

## 2019-11-18 MED ORDER — VITAMIN D (ERGOCALCIFEROL) 1.25 MG (50000 UNIT) PO CAPS: 50000.0000 [IU] | ORAL_CAPSULE | ORAL | 0 refills | Status: AC

## 2019-12-09 ENCOUNTER — Other Ambulatory Visit: Payer: Self-pay | Admitting: Internal Medicine

## 2019-12-09 DIAGNOSIS — E559 Vitamin D deficiency, unspecified: Secondary | ICD-10-CM

## 2019-12-09 NOTE — Telephone Encounter (Signed)
Please change to OTC Vitamin D3 at 2000 units per day, indefinitely.  

## 2019-12-11 ENCOUNTER — Ambulatory Visit: Payer: BC Managed Care – PPO | Admitting: Podiatry

## 2019-12-11 NOTE — Telephone Encounter (Signed)
Pt called & informed of Dr Jenny Reichmann requests to please change to OTC Vitamin D3 at 2000 units per day, indefinitely. Pt verb understanding.

## 2019-12-14 DIAGNOSIS — M79676 Pain in unspecified toe(s): Secondary | ICD-10-CM

## 2019-12-20 ENCOUNTER — Other Ambulatory Visit: Payer: Self-pay

## 2019-12-20 ENCOUNTER — Other Ambulatory Visit (INDEPENDENT_AMBULATORY_CARE_PROVIDER_SITE_OTHER): Payer: BC Managed Care – PPO

## 2019-12-20 DIAGNOSIS — E291 Testicular hypofunction: Secondary | ICD-10-CM | POA: Diagnosis not present

## 2019-12-20 LAB — CBC
HCT: 41.3 % (ref 39.0–52.0)
Hemoglobin: 13.4 g/dL (ref 13.0–17.0)
MCHC: 32.3 g/dL (ref 30.0–36.0)
MCV: 81.1 fl (ref 78.0–100.0)
Platelets: 211 10*3/uL (ref 150.0–400.0)
RBC: 5.09 Mil/uL (ref 4.22–5.81)
RDW: 14.7 % (ref 11.5–15.5)
WBC: 7.7 10*3/uL (ref 4.0–10.5)

## 2019-12-20 LAB — TESTOSTERONE: Testosterone: 421.57 ng/dL (ref 300.00–890.00)

## 2019-12-27 ENCOUNTER — Other Ambulatory Visit: Payer: Self-pay

## 2019-12-27 ENCOUNTER — Encounter: Payer: Self-pay | Admitting: Endocrinology

## 2019-12-27 ENCOUNTER — Ambulatory Visit (INDEPENDENT_AMBULATORY_CARE_PROVIDER_SITE_OTHER): Payer: BC Managed Care – PPO | Admitting: Endocrinology

## 2019-12-27 VITALS — BP 124/76 | HR 95 | Ht 74.0 in | Wt 359.2 lb

## 2019-12-27 DIAGNOSIS — E1165 Type 2 diabetes mellitus with hyperglycemia: Secondary | ICD-10-CM | POA: Diagnosis not present

## 2019-12-27 DIAGNOSIS — E291 Testicular hypofunction: Secondary | ICD-10-CM

## 2019-12-27 NOTE — Progress Notes (Signed)
Patient ID: [name], male   DOB: [birth year], 54 y.o.   MRN: [id number]            Chief complaint: Follow-up of low testosterone  History of Present Illness  Hypogonadismwas diagnosed in 2012  He has had complaints offatigue, decreased motivation, decreased libido, and general feeling bad The symptoms started a few years ago and somewhat worse for couple of months before his visit in 7/19 No obvious causes of low testosterone were identified on his initial consultation In 2012 his free testosterone level was low normal at 47 with total 180  Repeat free testosterone was 8.0 done on 01/16/2018 in the morning at 9:30 AM  RECENT history:  Treatment regimen: taking 2 pumps on each arm of the AndroGel  With using clomiphene he did not have adequate response of the testosterone level and also LH had gone above normal  He has been on AndroGel 1.62% since 01/2018 He had been titrated up to 4 pumps a day to get his testosterone level improved  He may occasionally feel a little tired but overall feels fairly good At times still has a little decreased libido but is also on SSRI treatment  He is applying the AndroGel regularly every morning after his shower and is allowing the gel to get dry He does apply the gel directly on his upper arm/shoulders as directed He has been regular with his prescriptions  His lab results showtestosterone level to be consistently back to normal at 421, his previous 2 levels had been low No increase in hemoglobin   Lab Results  Component Value Date   TESTOSTERONE 421.57 12/20/2019   TESTOSTERONE 445.28 06/20/2019   TESTOSTERONE 164.72 (L) 03/05/2019   TESTOSTERONE 180.49 (L) 11/30/2018    Baseline prolactin level: 8.3  Baseline LH of 7.4  Lab Results  Component Value Date   LH 13.04 (H) 03/29/2018   Lab Results  Component Value Date   HGB 13.4 12/20/2019   DIABETES: He has had type 2 diabetes treated by his PCP with metformin  Diagnosis  initially was in 2014 with an A1c of 10.9  Followed by PCP last in 5/21 when phentermine was added  Previously did not have any benefit from going to the weight management program He mostly checks his blood sugars fasting which are about 120+, [facility] Recently starting to lose weight   Wt Readings from Last 3 Encounters:  12/27/19 (!) 359 lb 3.2 oz (162.9 kg)  11/16/19 (!) 368 lb (166.9 kg)  07/30/19 (!) 359 lb (162.8 kg)    Lab Results  Component Value Date   HGBA1C 8.1 (H) 11/16/2019   HGBA1C 6.3 (H) 07/16/2019   HGBA1C 6.5 03/05/2019   Lab Results  Component Value Date   MICROALBUR <0.7 11/16/2019   [place] 74 11/16/2019   CREATININE 0.98 11/16/2019       Allergies as of 12/27/2019   No Known Allergies     Medication List       Accurate as of December 27, 2019 10:24 AM. If you have any questions, ask your nurse or doctor.        STOP taking these medications   amLODipine 5 MG tablet Commonly known as: NORVASC Stopped by: Elayne Snare, MD   benzonatate 100 MG capsule Commonly known as: Best boy Stopped by: Elayne Snare, MD   celecoxib 200 MG capsule Commonly known as: CeleBREX Stopped by: Elayne Snare, MD     TAKE these medications  atorvastatin 20 MG tablet Commonly known as: LIPITOR Take 20 mg by mouth daily.   buPROPion 150 MG 12 hr tablet Commonly known as: WELLBUTRIN SR Take 150 mg by mouth daily.   busPIRone 10 MG tablet Commonly known as: BUSPAR Take 10 mg by mouth 3 (three) times daily.   busPIRone 15 MG tablet Commonly known as: BUSPAR Take 15 mg by mouth at bedtime.   cromolyn 4 % ophthalmic solution Commonly known as: OPTICROM INSTILL 1 DROP INTO EACH EYE 4 TIMES DAILY AT REGULAR INTERVALS.   cyclobenzaprine 5 MG tablet Commonly known as: FLEXERIL Take 1 tablet (5 mg total) by mouth 3 (three) times daily as needed for muscle spasms.   diclofenac sodium 1 % Gel Commonly known as: Voltaren Apply 4 g  topically 4 (four) times daily as needed.   dicyclomine 10 MG capsule Commonly known as: BENTYL   DULoxetine 30 MG capsule Commonly known as: CYMBALTA Take 30 mg by mouth daily.   escitalopram 20 MG tablet Commonly known as: Lexapro Take 1 tablet (20 mg total) by mouth daily.   fluticasone 50 MCG/ACT nasal spray Commonly known as: FLONASE SPRAY 2 SPRAYS INTO EACH NOSTRIL EVERY DAY   gabapentin 300 MG capsule Commonly known as: NEURONTIN Take one AM, one PM, two QHS   HYDROcodone-acetaminophen 5-325 MG tablet Commonly known as: NORCO/VICODIN Take 1 tablet by mouth every 6 (six) hours as needed for moderate pain.   hyoscyamine 0.125 MG SL tablet Commonly known as: LEVSIN SL Place 1 tablet (0.125 mg total) under the tongue every 4 (four) hours as needed for up to 10 days.   indomethacin 50 MG capsule Commonly known as: INDOCIN TAKE ONE CAPSULE EVERY 6 HOURS AS NEEDED GOUT ONLY   loperamide 2 MG capsule Commonly known as: IMODIUM   meloxicam 15 MG tablet Commonly known as: MOBIC TAKE 1 TABLET (15 MG TOTAL) BY MOUTH DAILY.   meloxicam 15 MG tablet Commonly known as: MOBIC Take 1 tablet (15 mg total) by mouth daily.   metFORMIN 500 MG tablet Commonly known as: GLUCOPHAGE TAKE 2 TABLETS BY MOUTH TWICE A DAY   metoprolol tartrate 25 MG tablet Commonly known as: LOPRESSOR   olmesartan 20 MG tablet Commonly known as: BENICAR Take 1 tablet (20 mg total) by mouth daily. Annual appt due in Sept must see provider for future refills   omeprazole 40 MG capsule Commonly known as: PRILOSEC TAKE 1 CAPSULE BY MOUTH EVERY DAY   ondansetron 4 MG tablet Commonly known as: Zofran Take 1 tablet (4 mg total) by mouth every 8 (eight) hours as needed for nausea or vomiting.   oxyCODONE-acetaminophen 10-325 MG tablet Commonly known as: PERCOCET Take 1 tablet by mouth every 8 (eight) hours as needed for pain.   phentermine 37.5 MG capsule Take 1 capsule (37.5 mg total) by mouth  every morning.   prazosin 2 MG capsule Commonly known as: Minipress Take 1 capsule (2 mg total) by mouth at bedtime.   QUEtiapine 25 MG tablet Commonly known as: SEROQUEL Take 25 mg by mouth at bedtime.   tadalafil 20 MG tablet Commonly known as: CIALIS Take 0.5-1 tablets (10-20 mg total) by mouth every other day as needed for erectile dysfunction.   Testosterone 20.25 MG/ACT (1.62%) Gel Use 2 pumps on each arm   traZODone 50 MG tablet Commonly known as: DESYREL Take 1 tablet (50 mg total) by mouth at bedtime.   vardenafil 20 MG tablet Commonly known as: Levitra Take 1 tablet (20  mg total) by mouth daily as needed for erectile dysfunction.   Vitamin D (Ergocalciferol) 1.25 MG (50000 UNIT) Caps capsule Commonly known as: DRISDOL Take 1 capsule (50,000 Units total) by mouth every 7 (seven) days.   Xiidra 5 % Soln Generic drug: Lifitegrast INSTILL 1 DROP INTO BOTH EYES TWICE A DAY       Allergies: No Known Allergies  Past Medical History:  Diagnosis Date  . Acid reflux   . Alcohol abuse   . Anxiety   . Arthritis   . Depression 07/08/2015  . Diabetes mellitus ([place])   . Diabetes mellitus, type II ([place])   . DJD (degenerative joint disease)   . Gallbladder problem   . Gallstones   . GERD (gastroesophageal reflux disease)   . Gout   . HTN (hypertension)   . Hyperglycemia   . Hyperlipidemia   . IBS (irritable bowel syndrome)   . Joint pain   . Low testosterone in male   . Male hypogonadism 07/08/2015  . Obesity   . Osteoarthritis   . PONV (postoperative nausea and vomiting)   . Sleep apnea     Past Surgical History:  Procedure Laterality Date  . Back Cyst Excision     x2  . CHOLECYSTECTOMY  08/11/2011   Procedure: LAPAROSCOPIC CHOLECYSTECTOMY WITH INTRAOPERATIVE CHOLANGIOGRAM;  Surgeon: Judieth Keens, DO;  Location: [place];  Service: General;  Laterality: N/A;  Laparoscopic cholecystectomy with cholangiogram.  . COLONOSCOPY WITH PROPOFOL N/A 09/15/2015    Procedure: COLONOSCOPY WITH PROPOFOL;  Surgeon: Gatha Mayer, MD;  Location: [facility];  Service: Endoscopy;  Laterality: N/A;  . JOINT REPLACEMENT  01/26/2011   right knee  . KNEE ARTHROSCOPY     x 2 left  . KNEE ARTHROSCOPY     x 2 right  . REPAIR KNEE LIGAMENT     Left  . RIGHT FOOT     BONES STRAIGHTENED OUT   2006  . Shoulder Surgery, Reconstruction of Joint     Left x 3, Last 02/2007  . Tennis elbow surgery, Nerve entrapment     x2 right    Family History  Problem Relation Age of Onset  . Sarcoidosis Mother   . High Cholesterol Mother   . Obesity Mother   . Colon cancer Brother 40       half-brother  . Ovarian cancer Maternal Grandmother   . Cancer Neg Hx   . Heart disease Neg Hx   . Kidney disease Neg Hx     Social History:  reports that he has never smoked. He has never used smokeless tobacco. He reports that he does not drink alcohol and does not use drugs.  Review of Systems   Hypertension, treated with Benicar and Minipress, followed by PCP  BP Readings from Last 3 Encounters:  12/27/19 124/76  11/16/19 110/80  07/30/19 118/83     General Examination:   BP 124/76 (BP Location: Left Arm, Patient Position: Sitting, Cuff Size: Large)   Pulse 95   Ht 6\' 2"  (1.88 m)   Wt (!) 359 lb 3.2 oz (162.9 kg)   SpO2 97%   BMI 46.12 kg/m      Assessment/ Plan:  Hypogonadism, diagnosed around 2016  He had symptomatic hypogonadism  He has been treated with AndroGel 1.62% He is very regular with his testosterone gel, 2 pumps on each arm and is applying it with the proper technique  His symptoms have improved However discussed that his variable libido may be  related to SSRI treatment also  Testosterone level is consistently over 400 now  He can continue the same dosage. Again reviewed the instructions on how to apply the gel  DIABETES with recent BMI about 46: Poorly controlled on Metformin only Currently by starting phentermine he has started  losing weight and advised him to make an appointment for his PCP to follow-up A1c  Also recommended that he try to check his blood sugars more after meals instead of in the morning which will reflect his actual blood sugar control better and correspond to his A1c Also if he is interested in the freestyle libre needs to contact the [facility] for consult since currently likely does not need this being non-insulin-dependent  Follow-up in 6 months for hypogonadism  Elayne Snare 12/27/2019, 10:24 AM     Note: This office note was prepared with Dragon voice recognition system technology. Any transcriptional errors that result from this process are unintentional.

## 2020-01-01 ENCOUNTER — Other Ambulatory Visit: Payer: Self-pay

## 2020-01-01 ENCOUNTER — Ambulatory Visit: Payer: BC Managed Care – PPO | Admitting: Podiatry

## 2020-01-01 ENCOUNTER — Ambulatory Visit (INDEPENDENT_AMBULATORY_CARE_PROVIDER_SITE_OTHER): Payer: BC Managed Care – PPO

## 2020-01-01 DIAGNOSIS — M778 Other enthesopathies, not elsewhere classified: Secondary | ICD-10-CM | POA: Diagnosis not present

## 2020-01-01 NOTE — Progress Notes (Signed)
He presents today states that he still having some pain first metatarsophalangeal joint of the left foot states that he did get his disability states that he has good days and bad days and some throbbing pain.  He states that his gabapentin takes care of the pain however.  Objective: There is no edema erythema cellulitis drainage or odor pulses are palpable left.  He has good plantar flexion but dorsiflexion is limited.  Radiographs taken today demonstrate a well-healing surgical foot.  Plan: I will follow-up with him on an as-needed basis.

## 2020-01-15 ENCOUNTER — Telehealth: Payer: Self-pay

## 2020-01-15 NOTE — Telephone Encounter (Signed)
Per [facility] on 01/14/2020 9:53:56 AM, Caller states, needs to schedule an appt. For tomorrow. Having problem with prostate. Never had an issue but believes it could be that or a something else. Urinary need after ejaculation. Needs mediation. Caller states that he has pressure after ejaculation. No blood Seen.  Advised to see PCP within 3 days. Patient scheduled for 01/16/2020 at 10:40am.

## 2020-01-16 ENCOUNTER — Encounter: Payer: Self-pay | Admitting: Internal Medicine

## 2020-01-16 ENCOUNTER — Ambulatory Visit (INDEPENDENT_AMBULATORY_CARE_PROVIDER_SITE_OTHER): Payer: BC Managed Care – PPO | Admitting: Internal Medicine

## 2020-01-16 ENCOUNTER — Other Ambulatory Visit: Payer: Self-pay

## 2020-01-16 VITALS — BP 102/80 | HR 97 | Temp 97.7°F | Ht 74.0 in | Wt 357.0 lb

## 2020-01-16 DIAGNOSIS — R300 Dysuria: Secondary | ICD-10-CM

## 2020-01-16 DIAGNOSIS — I10 Essential (primary) hypertension: Secondary | ICD-10-CM

## 2020-01-16 DIAGNOSIS — E119 Type 2 diabetes mellitus without complications: Secondary | ICD-10-CM

## 2020-01-16 MED ORDER — DOXYCYCLINE HYCLATE 100 MG PO TABS: 100.0000 mg | ORAL_TABLET | Freq: Two times a day (BID) | ORAL | 0 refills | Status: DC

## 2020-01-16 NOTE — Assessment & Plan Note (Addendum)
Mild, likely prostatitis, for doxy course,  to f/u any worsening symptoms or concerns  I spent 31 minutes in preparing to see the patient by review of recent labs, imaging and procedures, obtaining and reviewing separately obtained history, communicating with the patient and family or caregiver, ordering medications, tests or procedures, and documenting clinical information in the EHR including the differential Dx, treatment, and any further evaluation and other management of dysuria, dm, htn

## 2020-01-16 NOTE — Patient Instructions (Signed)
Please take all new medication as prescribed - the antibiotic  Please continue all other medications as before, and refills have been done if requested.  Please have the pharmacy call with any other refills you may need.  Please continue your efforts at being more active, low cholesterol diet, and weight control.  Please keep your appointments with your specialists as you may have planned    

## 2020-01-16 NOTE — Assessment & Plan Note (Signed)
stable overall by history and exam, recent data reviewed with pt, and pt to continue medical treatment as before,  to f/u any worsening symptoms or concerns  

## 2020-01-16 NOTE — Progress Notes (Signed)
Subjective:    Patient ID: [name], male    DOB: Nov 10, 1965, 54 y.o.   MRN: [id number]  HPI  Here with onset 3 days of mild dysuria post ejacualtion, but Denies urinary symptoms such as dysuria, frequency, urgency, flank pain, hematuria or n/v, fever, chills.  Pt denies chest pain, increased sob or doe, wheezing, orthopnea, PND, increased LE swelling, palpitations, dizziness or syncope.  Pt denies new neurological symptoms such as new headache, or facial or extremity weakness or numbness   Pt denies polydipsia, polyuria Past Medical History:  Diagnosis Date  . Acid reflux   . Alcohol abuse   . Anxiety   . Arthritis   . Depression 07/08/2015  . Diabetes mellitus ()   . Diabetes mellitus, type II ([place])   . DJD (degenerative joint disease)   . Gallbladder problem   . Gallstones   . GERD (gastroesophageal reflux disease)   . Gout   . HTN (hypertension)   . Hyperglycemia   . Hyperlipidemia   . IBS (irritable bowel syndrome)   . Joint pain   . Low testosterone in male   . Male hypogonadism 07/08/2015  . Obesity   . Osteoarthritis   . PONV (postoperative nausea and vomiting)   . Sleep apnea    Past Surgical History:  Procedure Laterality Date  . Back Cyst Excision     x2  . CHOLECYSTECTOMY  08/11/2011   Procedure: LAPAROSCOPIC CHOLECYSTECTOMY WITH INTRAOPERATIVE CHOLANGIOGRAM;  Surgeon: Judieth Keens, DO;  Location: [place];  Service: General;  Laterality: N/A;  Laparoscopic cholecystectomy with cholangiogram.  . COLONOSCOPY WITH PROPOFOL N/A 09/15/2015   Procedure: COLONOSCOPY WITH PROPOFOL;  Surgeon: Gatha Mayer, MD;  Location: [facility];  Service: Endoscopy;  Laterality: N/A;  . JOINT REPLACEMENT  01/26/2011   right knee  . KNEE ARTHROSCOPY     x 2 left  . KNEE ARTHROSCOPY     x 2 right  . REPAIR KNEE LIGAMENT     Left  . RIGHT FOOT     BONES STRAIGHTENED OUT   2006  . Shoulder Surgery, Reconstruction of Joint     Left x 3, Last 02/2007  . Tennis elbow  surgery, Nerve entrapment     x2 right    reports that he has never smoked. He has never used smokeless tobacco. He reports that he does not drink alcohol and does not use drugs. family history includes Colon cancer (age of onset: 22) in his brother; High Cholesterol in his mother; Obesity in his mother; Ovarian cancer in his maternal grandmother; Sarcoidosis in his mother. No Known Allergies Current Outpatient Medications on File Prior to Visit  Medication Sig Dispense Refill  . atorvastatin (LIPITOR) 20 MG tablet Take 20 mg by mouth daily.    [name] buPROPion (WELLBUTRIN SR) 150 MG 12 hr tablet Take 150 mg by mouth daily.    . busPIRone (BUSPAR) 10 MG tablet Take 10 mg by mouth 3 (three) times daily.    . busPIRone (BUSPAR) 15 MG tablet Take 15 mg by mouth at bedtime.    . cromolyn (OPTICROM) 4 % ophthalmic solution INSTILL 1 DROP INTO EACH EYE 4 TIMES DAILY AT REGULAR INTERVALS.    [name] cyclobenzaprine (FLEXERIL) 5 MG tablet Take 1 tablet (5 mg total) by mouth 3 (three) times daily as needed for muscle spasms. 40 tablet 5  . diclofenac sodium (VOLTAREN) 1 % GEL Apply 4 g topically 4 (four) times daily as needed. 400 g 1  .  dicyclomine (BENTYL) 10 MG capsule     . DULoxetine (CYMBALTA) 30 MG capsule Take 30 mg by mouth daily.    . fluticasone (FLONASE) 50 MCG/ACT nasal spray SPRAY 2 SPRAYS INTO EACH NOSTRIL EVERY DAY 48 mL 2  . gabapentin (NEURONTIN) 300 MG capsule Take one AM, one PM, two QHS 240 capsule 2  . HYDROcodone-acetaminophen (NORCO/VICODIN) 5-325 MG tablet Take 1 tablet by mouth every 6 (six) hours as needed for moderate pain. 40 tablet 0  . indomethacin (INDOCIN) 50 MG capsule TAKE ONE CAPSULE EVERY 6 HOURS AS NEEDED GOUT ONLY 60 capsule 2  . loperamide (IMODIUM) 2 MG capsule     . meloxicam (MOBIC) 15 MG tablet Take 1 tablet (15 mg total) by mouth daily. 30 tablet 3  . meloxicam (MOBIC) 7.5 MG tablet Take 7.5 mg by mouth daily.    . metFORMIN (GLUCOPHAGE) 500 MG tablet TAKE 2 TABLETS  BY MOUTH TWICE A DAY 360 tablet 1  . metoprolol tartrate (LOPRESSOR) 25 MG tablet     . olmesartan (BENICAR) 20 MG tablet Take 20 mg by mouth daily.    [name] omeprazole (PRILOSEC) 40 MG capsule TAKE 1 CAPSULE BY MOUTH EVERY DAY 90 capsule 1  . ondansetron (ZOFRAN) 4 MG tablet Take 1 tablet (4 mg total) by mouth every 8 (eight) hours as needed for nausea or vomiting. 20 tablet 0  . oxyCODONE-acetaminophen (PERCOCET) 10-325 MG tablet Take 1 tablet by mouth every 8 (eight) hours as needed for pain. 20 tablet 0  . phentermine 37.5 MG capsule Take 1 capsule (37.5 mg total) by mouth every morning. 30 capsule 5  . prazosin (MINIPRESS) 2 MG capsule Take 1 capsule (2 mg total) by mouth at bedtime. 30 capsule 1  . QUEtiapine (SEROQUEL) 25 MG tablet Take 25 mg by mouth at bedtime.    . tadalafil (CIALIS) 20 MG tablet Take 0.5-1 tablets (10-20 mg total) by mouth every other day as needed for erectile dysfunction. 5 tablet 11  . Testosterone 20.25 MG/ACT (1.62%) GEL Use 2 pumps on each arm 450 g 1  . traZODone (DESYREL) 50 MG tablet Take 1 tablet (50 mg total) by mouth at bedtime. 30 tablet 1  . vardenafil (LEVITRA) 20 MG tablet Take 1 tablet (20 mg total) by mouth daily as needed for erectile dysfunction. 6 tablet 5  . Vitamin D, Ergocalciferol, (DRISDOL) 1.25 MG (50000 UNIT) CAPS capsule Take 1 capsule (50,000 Units total) by mouth every 7 (seven) days. 4 capsule 0  . XIIDRA 5 % SOLN INSTILL 1 DROP INTO BOTH EYES TWICE A DAY    . escitalopram (LEXAPRO) 20 MG tablet Take 1 tablet (20 mg total) by mouth daily. 30 tablet 2  . hyoscyamine (LEVSIN SL) 0.125 MG SL tablet Place 1 tablet (0.125 mg total) under the tongue every 4 (four) hours as needed for up to 10 days. 30 tablet 5   No current facility-administered medications on file prior to visit.   Review of Systems All otherwise neg per pt     Objective:   Physical Exam BP 102/80 (BP Location: Left Arm, Patient Position: Sitting, Cuff Size: Large)   Pulse  97   Temp 97.7 F (36.5 C) (Oral)   Ht 6\' 2"  (1.88 m)   Wt (!) 357 lb (161.9 kg)   SpO2 97%   BMI 45.84 kg/m  VS noted,  Constitutional: Pt appears in NAD HENT: Head: NCAT.  Right Ear: External ear normal.  Left Ear: External ear normal.  Eyes: . Pupils are equal, round, and reactive to light. Conjunctivae and EOM are normal Nose: without d/c or deformity Neck: Neck supple. Gross normal ROM Cardiovascular: Normal rate and regular rhythm.   Pulmonary/Chest: Effort normal and breath sounds without rales or wheezing.  Abd:  Soft, NT, ND, + BS, no organomegaly Neurological: Pt is alert. At baseline orientation, motor grossly intact Skin: Skin is warm. No rashes, other new lesions, no LE edema Psychiatric: Pt behavior is normal without agitation  All otherwise neg per pt Lab Results  Component Value Date   WBC 7.7 12/20/2019   HGB 13.4 12/20/2019   HCT 41.3 12/20/2019   PLT 211.0 12/20/2019   GLUCOSE 131 (H) 11/16/2019   CHOL 120 11/16/2019   TRIG 62.0 11/16/2019   HDL 34.00 (L) 11/16/2019   LDLCALC 74 11/16/2019   ALT 36 11/16/2019   AST 18 11/16/2019   NA 136 11/16/2019   K 4.0 11/16/2019   CL 100 11/16/2019   CREATININE 0.98 11/16/2019   BUN 14 11/16/2019   CO2 30 11/16/2019   TSH 1.43 11/16/2019   PSA 0.17 11/16/2019   INR 0.96 05/14/2011   HGBA1C 8.1 (H) 11/16/2019   MICROALBUR <0.7 11/16/2019      Assessment & Plan:

## 2020-03-05 ENCOUNTER — Other Ambulatory Visit: Payer: Self-pay | Admitting: Internal Medicine

## 2020-03-05 DIAGNOSIS — E559 Vitamin D deficiency, unspecified: Secondary | ICD-10-CM

## 2020-03-25 ENCOUNTER — Other Ambulatory Visit: Payer: Self-pay | Admitting: Internal Medicine

## 2020-03-25 NOTE — Telephone Encounter (Signed)
Please refill as per office routine med refill policy (all routine meds refilled for 3 mo or monthly per pt preference up to one year from last visit, then month to month grace period for 3 mo, then further med refills will have to be denied)  

## 2020-04-01 LAB — HM DIABETES EYE EXAM

## 2020-04-04 ENCOUNTER — Telehealth: Payer: Self-pay | Admitting: Internal Medicine

## 2020-04-04 NOTE — Telephone Encounter (Signed)
Patient is requesting a refill on the following medication.  doxycycline (VIBRA-TABS) 100 MG tablet   He states he is having the same problem again. He was seen for this on 01/16/20.  Refused an appointment.  Send to pharmacy on file.

## 2020-04-05 ENCOUNTER — Other Ambulatory Visit: Payer: Self-pay

## 2020-04-05 ENCOUNTER — Telehealth (INDEPENDENT_AMBULATORY_CARE_PROVIDER_SITE_OTHER): Payer: BC Managed Care – PPO | Admitting: Family Medicine

## 2020-04-05 ENCOUNTER — Encounter: Payer: Self-pay | Admitting: Family Medicine

## 2020-04-05 DIAGNOSIS — N5312 Painful ejaculation: Secondary | ICD-10-CM | POA: Insufficient documentation

## 2020-04-05 MED ORDER — DOXYCYCLINE HYCLATE 100 MG PO TABS
100.0000 mg | ORAL_TABLET | Freq: Two times a day (BID) | ORAL | 0 refills | Status: DC
Start: 2020-04-05 — End: 2020-08-12

## 2020-04-05 NOTE — Progress Notes (Signed)
Virtual Visit via telephone Note  This visit type was conducted due to national recommendations for restrictions regarding the COVID-19 pandemic (e.g. social distancing).  This format is felt to be most appropriate for this patient at this time.  All issues noted in this document were discussed and addressed.  No physical exam was performed (except for noted visual exam findings with Video Visits).   I connected with [name] today at 12:00 PM EDT by telephone and verified that I am speaking with the correct person using two identifiers. Location patient: Dietitian provider: work  Persons participating in the virtual visit: patient, provider  I discussed the limitations, risks, security and privacy concerns of performing an evaluation and management service by telephone and the availability of in person appointments. I also discussed with the patient that there may be a patient responsible charge related to this service. The patient expressed understanding and agreed to proceed.  Interactive audio and video telecommunications were attempted between this provider and patient, however failed, due to patient having technical difficulties OR patient did not have access to video capability.  We continued and completed visit with audio only.   Reason for visit: same day visit  HPI: Pressure with ejaculation: Patient notes for the last couple of days it feels as though pressure builds up when he ejaculates.  The pressure sensation is in his penis and scrotum.  Feels like he has to rush to the bathroom to urinate.  No pain associated with this.  No dysuria.  No blood in his semen or urine.  No urinary frequency or urgency.  No penile discharge.  He has a single sexual partner.  Notes the symptoms last for 5 to 10 minutes and then resolved.  He was treated for similar symptoms in July with doxycycline for presumed mild prostatitis.  He notes that resolved the symptoms.   ROS: See pertinent  positives and negatives per HPI.  Past Medical History:  Diagnosis Date  . Acid reflux   . Alcohol abuse   . Anxiety   . Arthritis   . Depression 07/08/2015  . Diabetes mellitus ([place])   . Diabetes mellitus, type II ([place])   . DJD (degenerative joint disease)   . Gallbladder problem   . Gallstones   . GERD (gastroesophageal reflux disease)   . Gout   . HTN (hypertension)   . Hyperglycemia   . Hyperlipidemia   . IBS (irritable bowel syndrome)   . Joint pain   . Low testosterone in male   . Male hypogonadism 07/08/2015  . Obesity   . Osteoarthritis   . PONV (postoperative nausea and vomiting)   . Sleep apnea     Past Surgical History:  Procedure Laterality Date  . Back Cyst Excision     x2  . CHOLECYSTECTOMY  08/11/2011   Procedure: LAPAROSCOPIC CHOLECYSTECTOMY WITH INTRAOPERATIVE CHOLANGIOGRAM;  Surgeon: Judieth Keens, DO;  Location: [facility];  Service: General;  Laterality: N/A;  Laparoscopic cholecystectomy with cholangiogram.  . COLONOSCOPY WITH PROPOFOL N/A 09/15/2015   Procedure: COLONOSCOPY WITH PROPOFOL;  Surgeon: Gatha Mayer, MD;  Location: [facility];  Service: Endoscopy;  Laterality: N/A;  . JOINT REPLACEMENT  01/26/2011   right knee  . KNEE ARTHROSCOPY     x 2 left  . KNEE ARTHROSCOPY     x 2 right  . REPAIR KNEE LIGAMENT     Left  . RIGHT FOOT     BONES STRAIGHTENED OUT   2006  .  Shoulder Surgery, Reconstruction of Joint     Left x 3, Last 02/2007  . Tennis elbow surgery, Nerve entrapment     x2 right    Family History  Problem Relation Age of Onset  . Sarcoidosis Mother   . High Cholesterol Mother   . Obesity Mother   . Colon cancer Brother 45       half-brother  . Ovarian cancer Maternal Grandmother   . Cancer Neg Hx   . Heart disease Neg Hx   . Kidney disease Neg Hx     SOCIAL HX: nonsmoker   Current Outpatient Medications:  .  atorvastatin (LIPITOR) 20 MG tablet, Take 20 mg by mouth daily., Disp: , Rfl:  .  buPROPion (WELLBUTRIN  SR) 150 MG 12 hr tablet, Take 150 mg by mouth daily., Disp: , Rfl:  .  busPIRone (BUSPAR) 10 MG tablet, Take 10 mg by mouth 3 (three) times daily., Disp: , Rfl:  .  busPIRone (BUSPAR) 15 MG tablet, Take 15 mg by mouth at bedtime., Disp: , Rfl:  .  cromolyn (OPTICROM) 4 % ophthalmic solution, INSTILL 1 DROP INTO EACH EYE 4 TIMES DAILY AT REGULAR INTERVALS., Disp: , Rfl:  .  cyclobenzaprine (FLEXERIL) 5 MG tablet, Take 1 tablet (5 mg total) by mouth 3 (three) times daily as needed for muscle spasms., Disp: 40 tablet, Rfl: 5 .  diclofenac sodium (VOLTAREN) 1 % GEL, Apply 4 g topically 4 (four) times daily as needed., Disp: 400 g, Rfl: 1 .  dicyclomine (BENTYL) 10 MG capsule, , Disp: , Rfl:  .  doxycycline (VIBRA-TABS) 100 MG tablet, Take 1 tablet (100 mg total) by mouth 2 (two) times daily., Disp: 20 tablet, Rfl: 0 .  DULoxetine (CYMBALTA) 30 MG capsule, Take 30 mg by mouth daily., Disp: , Rfl:  .  fluticasone (FLONASE) 50 MCG/ACT nasal spray, SPRAY 2 SPRAYS INTO EACH NOSTRIL EVERY DAY, Disp: 48 mL, Rfl: 2 .  gabapentin (NEURONTIN) 300 MG capsule, Take one AM, one PM, two QHS, Disp: 240 capsule, Rfl: 2 .  HYDROcodone-acetaminophen (NORCO/VICODIN) 5-325 MG tablet, Take 1 tablet by mouth every 6 (six) hours as needed for moderate pain., Disp: 40 tablet, Rfl: 0 .  indomethacin (INDOCIN) 50 MG capsule, TAKE ONE CAPSULE EVERY 6 HOURS AS NEEDED GOUT ONLY, Disp: 60 capsule, Rfl: 2 .  loperamide (IMODIUM) 2 MG capsule, , Disp: , Rfl:  .  meloxicam (MOBIC) 15 MG tablet, Take 1 tablet (15 mg total) by mouth daily., Disp: 30 tablet, Rfl: 3 .  meloxicam (MOBIC) 7.5 MG tablet, Take 7.5 mg by mouth daily., Disp: , Rfl:  .  metFORMIN (GLUCOPHAGE) 500 MG tablet, TAKE 2 TABLETS BY MOUTH TWICE A DAY, Disp: 360 tablet, Rfl: 1 .  metoprolol tartrate (LOPRESSOR) 25 MG tablet, , Disp: , Rfl:  .  olmesartan (BENICAR) 20 MG tablet, TAKE 1 TABLET (20 MG TOTAL) BY MOUTH DAILY. ANNUAL APPT DUE IN SEPT MUST SEE PROVIDER FOR  FUTURE REFILLS, Disp: 90 tablet, Rfl: 1 .  omeprazole (PRILOSEC) 40 MG capsule, TAKE 1 CAPSULE BY MOUTH EVERY DAY, Disp: 90 capsule, Rfl: 2 .  ondansetron (ZOFRAN) 4 MG tablet, Take 1 tablet (4 mg total) by mouth every 8 (eight) hours as needed for nausea or vomiting., Disp: 20 tablet, Rfl: 0 .  oxyCODONE-acetaminophen (PERCOCET) 10-325 MG tablet, Take 1 tablet by mouth every 8 (eight) hours as needed for pain., Disp: 20 tablet, Rfl: 0 .  phentermine 37.5 MG capsule, Take 1 capsule (37.5 mg  total) by mouth every morning., Disp: 30 capsule, Rfl: 5 .  prazosin (MINIPRESS) 2 MG capsule, Take 1 capsule (2 mg total) by mouth at bedtime., Disp: 30 capsule, Rfl: 1 .  QUEtiapine (SEROQUEL) 25 MG tablet, Take 25 mg by mouth at bedtime., Disp: , Rfl:  .  tadalafil (CIALIS) 20 MG tablet, Take 0.5-1 tablets (10-20 mg total) by mouth every other day as needed for erectile dysfunction., Disp: 5 tablet, Rfl: 11 .  Testosterone 20.25 MG/ACT (1.62%) GEL, Use 2 pumps on each arm, Disp: 450 g, Rfl: 1 .  traZODone (DESYREL) 50 MG tablet, Take 1 tablet (50 mg total) by mouth at bedtime., Disp: 30 tablet, Rfl: 1 .  vardenafil (LEVITRA) 20 MG tablet, Take 1 tablet (20 mg total) by mouth daily as needed for erectile dysfunction., Disp: 6 tablet, Rfl: 5 .  Vitamin D, Ergocalciferol, (DRISDOL) 1.25 MG (50000 UNIT) CAPS capsule, Take 1 capsule (50,000 Units total) by mouth every 7 (seven) days., Disp: 4 capsule, Rfl: 0 .  XIIDRA 5 % SOLN, INSTILL 1 DROP INTO BOTH EYES TWICE A DAY, Disp: , Rfl:  .  escitalopram (LEXAPRO) 20 MG tablet, Take 1 tablet (20 mg total) by mouth daily., Disp: 30 tablet, Rfl: 2 .  hyoscyamine (LEVSIN SL) 0.125 MG SL tablet, Place 1 tablet (0.125 mg total) under the tongue every 4 (four) hours as needed for up to 10 days., Disp: 30 tablet, Rfl: 5 .  sildenafil (VIAGRA) 100 MG tablet, Take by mouth., Disp: , Rfl:   EXAM: This was a telephone visit and thus no physical exam was completed.  ASSESSMENT  AND PLAN:  Discussed the following assessment and plan:  Discomfort with ejaculation Suspect mild prostatitis given similar symptoms previously resolved with treatment for prostatitis.  Will give him another course of doxycycline as that was beneficial previously.  Discussed given that this is a recurrent issue he should follow-up with his urologist though patient was hesitant to do so at this time.  Discussed this would be particularly important if he had another issue.  Advised to contact his PCP if doxycycline is not beneficial or if he has recurrent symptoms.   No orders of the defined types were placed in this encounter.   Meds ordered this encounter  Medications  . doxycycline (VIBRA-TABS) 100 MG tablet    Sig: Take 1 tablet (100 mg total) by mouth 2 (two) times daily.    Dispense:  20 tablet    Refill:  0     I discussed the assessment and treatment plan with the patient. The patient was provided an opportunity to ask questions and all were answered. The patient agreed with the plan and demonstrated an understanding of the instructions.   The patient was advised to call back or seek an in-person evaluation if the symptoms worsen or if the condition fails to improve as anticipated.  I provided 5 minutes of non-face-to-face time during this encounter.   Tommi Rumps, MD

## 2020-04-05 NOTE — Assessment & Plan Note (Signed)
Suspect mild prostatitis given similar symptoms previously resolved with treatment for prostatitis.  Will give him another course of doxycycline as that was beneficial previously.  Discussed given that this is a recurrent issue he should follow-up with his urologist though patient was hesitant to do so at this time.  Discussed this would be particularly important if he had another issue.  Advised to contact his PCP if doxycycline is not beneficial or if he has recurrent symptoms.

## 2020-04-07 ENCOUNTER — Encounter: Payer: Self-pay | Admitting: Internal Medicine

## 2020-04-07 NOTE — Telephone Encounter (Signed)
Already done sept 24

## 2020-04-07 NOTE — Telephone Encounter (Signed)
Sent to Dr. John to advise. 

## 2020-05-13 ENCOUNTER — Encounter: Payer: Self-pay | Admitting: Podiatry

## 2020-05-13 ENCOUNTER — Ambulatory Visit (INDEPENDENT_AMBULATORY_CARE_PROVIDER_SITE_OTHER): Payer: BC Managed Care – PPO

## 2020-05-13 ENCOUNTER — Other Ambulatory Visit: Payer: Self-pay | Admitting: Podiatry

## 2020-05-13 ENCOUNTER — Ambulatory Visit: Payer: BC Managed Care – PPO | Admitting: Podiatry

## 2020-05-13 ENCOUNTER — Other Ambulatory Visit: Payer: Self-pay

## 2020-05-13 DIAGNOSIS — M7662 Achilles tendinitis, left leg: Secondary | ICD-10-CM

## 2020-05-13 DIAGNOSIS — M7752 Other enthesopathy of left foot: Secondary | ICD-10-CM | POA: Diagnosis not present

## 2020-05-13 DIAGNOSIS — M778 Other enthesopathies, not elsewhere classified: Secondary | ICD-10-CM

## 2020-05-13 NOTE — Progress Notes (Signed)
He presents today for a chief complaint of a painful Achilles lateral ankle left.  He states that he just started a few days ago denies any injury is been wearing his compression ankle which makes it feel better  Objective: Vital signs are stable alert oriented x3 pulses are palpable.  There is no erythema cellulitis drainage or odor is mild edema particularly posterior aspect of the calcaneus and to the sinus tarsi area left.  He does have some restriction on range of motion of the first metatarsophalangeal joint secondary to arthritis of the sesamoids and a Keller arthroplasty which was performed.  Radiographs taken today demonstrate Keller arthroplasty with implant intact no spurring noted.  Some mild thickening of the Achilles posteriorly on lateral view but no visible defect on radiograph.  Assessment: Well-healing surgical foot though limited secondary to that arthritis.  Achilles tendinitis left.  Subtalar joint capsulitis peroneal tendinitis left.  Plan: At this point he did not want any injections I placed him in the night splint and started him back on his meloxicam 15 mg.  He also takes gabapentin when necessary.

## 2020-05-15 ENCOUNTER — Other Ambulatory Visit: Payer: Self-pay | Admitting: Endocrinology

## 2020-05-22 ENCOUNTER — Ambulatory Visit: Payer: Self-pay

## 2020-05-22 ENCOUNTER — Ambulatory Visit: Payer: BC Managed Care – PPO | Admitting: Orthopedic Surgery

## 2020-05-22 DIAGNOSIS — M25512 Pain in left shoulder: Secondary | ICD-10-CM | POA: Diagnosis not present

## 2020-05-22 DIAGNOSIS — Z9889 Other specified postprocedural states: Secondary | ICD-10-CM

## 2020-05-22 MED ORDER — MELOXICAM 15 MG PO TABS: ORAL_TABLET | ORAL | 3 refills | Status: DC

## 2020-05-25 ENCOUNTER — Encounter: Payer: Self-pay | Admitting: Orthopedic Surgery

## 2020-05-25 NOTE — Progress Notes (Signed)
Office Visit Note   Patient: [name]           Date of Birth: February 09, 1966           MRN: [id number] Visit Date: 05/22/2020 Requested by: Biagio Borg, MD [address] [place],  [place] [address] PCP: Biagio Borg, MD  Subjective: Chief Complaint  Patient presents with  . Left Shoulder - Pain    HPI: [name] is a 54 y.o. male who presents to the office complaining of left shoulder pain.  Patient notes left shoulder pain over the last 4 months without any injury.  He complains of painful range of motion.  He is right-hand dominant.  Describes a "deep pain" in his shoulder without any radiation.  Denies any neck pain.  Pain does not wake him up at night.  He does have history of prior surgery on the left shoulder around 2007 by Dr. Marlou Sa where he had his shoulder "tightened up" for shoulder dislocation at that time.  He also has 2 other procedures on the left shoulder but cannot recall exactly what they were aside from one being a distal clavicle excision.  Denies any radicular pain down the arm or numbness/tingling.  Comes and goes in intensity.  Does have a history of diabetes but his last A1c was within normal range..                ROS: All systems reviewed are negative as they relate to the chief complaint within the history of present illness.  Patient denies fevers or chills.  Assessment & Plan: Visit Diagnoses:  1. Left shoulder pain, unspecified chronicity   2. Status post labral repair of shoulder   3. Arthralgia of left acromioclavicular joint     Plan: Patient is a 54 year old male presents complaining of left shoulder pain.  He does have history of distal clavicle excision of the left shoulder as well as what sounds like a labral repair for shoulder dislocation back in 2007.  He was doing very well with this until about 4 months ago when he started to notice pain that waxes and wanes in intensity.  No acute injury that he can remember.  He describes a "deep pain" in his  left shoulder without any radiation or any sign of referred pain from his neck.  Excellent strength of the rotator cuff on exam.  Shoulder is stable on exam to anterior/posterior directed forces.  Radiographs show no fracture dislocation but there is a bone ossicle off the distal clavicle where the clavicle was excised.  Discussed options available to patient.  After discussion, plan to prescribe Mobic to take 15 days every day and then take it as needed.  Follow-up with the office as needed if no significant improvement in pain.  Patient agreed with plan.  Follow-Up Instructions: No follow-ups on file.   Orders:  Orders Placed This Encounter  Procedures  . XR Shoulder Left   Meds ordered this encounter  Medications  . meloxicam (MOBIC) 15 MG tablet    Sig: 1 po q d prn    Dispense:  30 tablet    Refill:  3      Procedures: No procedures performed   Clinical Data: No additional findings.  Objective: Vital Signs: There were no vitals taken for this visit.  Physical Exam:  Constitutional: Patient appears well-developed HEENT:  Head: Normocephalic Eyes:EOM are normal Neck: Normal range of motion Cardiovascular: Normal rate Pulmonary/chest: Effort normal Neurologic: Patient is  alert Skin: Skin is warm Psychiatric: Patient has normal mood and affect  Ortho Exam: Ortho exam demonstrates left shoulder with 30 degrees external rotation, 95 degrees abduction, 130 degrees forward flexion.  States this range of motion is about his normal even before his recent onset of pain.  No crepitus felt with passive range of motion of the left shoulder.  Excellent strength of the supraspinatus, infraspinatus, subscapularis of the left shoulder.  5/5 motor strength of the bilateral grip strength, finger abduction, pronation/supination, bicep, tricep, deltoid.  No tenderness over the axial cervical spine.  Mild tenderness over the [facility] joint.  Mild tenderness over the bicipital groove.  Negative  impingement signs.  Shoulder stable to anterior/posterior force with no apprehension.  Specialty Comments:  No specialty comments available.  Imaging: No results found.   PMFS History: Patient Active Problem List   Diagnosis Date Noted  . Discomfort with ejaculation 04/05/2020  . Dysuria 01/16/2020  . Vitamin B 12 deficiency 07/17/2019  . COVID-19 virus infection 05/17/2019  . IBS (irritable bowel syndrome) 10/01/2018  . Erectile dysfunction 10/01/2018  . Severe episode of recurrent major depressive disorder, without psychotic features ([place]) 08/24/2018  . Pes planus, flexible 05/11/2018  . Cough 04/03/2018  . Sudden right hearing loss 12/18/2017  . Fatigue 12/15/2017  . OSA on CPAP 09/16/2017  . Parotid cyst 07/08/2017  . Chronic pain 03/15/2017  . Lower back pain 09/07/2016  . Rash of neck 04/15/2016  . Mass of testicle 01/08/2016  . Family history of colon cancer   . Nightmares associated with chronic post-traumatic stress disorder 08/07/2015  . PTSD (post-traumatic stress disorder) 07/09/2015  . Dysthymia 07/09/2015  . Generalized anxiety disorder 07/09/2015  . Male hypogonadism 07/08/2015  . Depression 07/08/2015  . Morbid obesity ([place]) 08/06/2014  . Left knee pain 05/28/2014  . Acute gouty arthritis 12/04/2013  . Helicobacter pylori (H. pylori) infection 02/10/2012  . Total knee replacement status, right 11/17/2011  . Meralgia paresthetica 09/23/2011  . Diabetes ([place]) 07/08/2011  . Hypogonadism male 07/08/2011  . Encounter for well adult exam with abnormal findings 07/08/2011  . DJD (degenerative joint disease) of knee 04/19/2011  . Hyperlipidemia 03/15/2007  . OBESITY NOS 03/15/2007  . Essential hypertension 03/15/2007   Past Medical History:  Diagnosis Date  . Acid reflux   . Alcohol abuse   . Anxiety   . Arthritis   . Depression 07/08/2015  . Diabetes mellitus ([place])   . Diabetes mellitus, type II ([place])   . DJD (degenerative joint disease)   .  Gallbladder problem   . Gallstones   . GERD (gastroesophageal reflux disease)   . Gout   . HTN (hypertension)   . Hyperglycemia   . Hyperlipidemia   . IBS (irritable bowel syndrome)   . Joint pain   . Low testosterone in male   . Male hypogonadism 07/08/2015  . Obesity   . Osteoarthritis   . PONV (postoperative nausea and vomiting)   . Sleep apnea     Family History  Problem Relation Age of Onset  . Sarcoidosis Mother   . High Cholesterol Mother   . Obesity Mother   . Colon cancer Brother 79       half-brother  . Ovarian cancer Maternal Grandmother   . Cancer Neg Hx   . Heart disease Neg Hx   . Kidney disease Neg Hx     Past Surgical History:  Procedure Laterality Date  . Back Cyst Excision     x2  .  CHOLECYSTECTOMY  08/11/2011   Procedure: LAPAROSCOPIC CHOLECYSTECTOMY WITH INTRAOPERATIVE CHOLANGIOGRAM;  Surgeon: Judieth Keens, DO;  Location: [facility];  Service: General;  Laterality: N/A;  Laparoscopic cholecystectomy with cholangiogram.  . COLONOSCOPY WITH PROPOFOL N/A 09/15/2015   Procedure: COLONOSCOPY WITH PROPOFOL;  Surgeon: Gatha Mayer, MD;  Location: [facility];  Service: Endoscopy;  Laterality: N/A;  . JOINT REPLACEMENT  01/26/2011   right knee  . KNEE ARTHROSCOPY     x 2 left  . KNEE ARTHROSCOPY     x 2 right  . REPAIR KNEE LIGAMENT     Left  . RIGHT FOOT     BONES STRAIGHTENED OUT   2006  . Shoulder Surgery, Reconstruction of Joint     Left x 3, Last 02/2007  . Tennis elbow surgery, Nerve entrapment     x2 right   Social History   Occupational History  . Occupation: Interior and spatial designer [employer]    Employer: [employer]  Tobacco Use  . Smoking status: Never Smoker  . Smokeless tobacco: Never Used  Substance and Sexual Activity  . Alcohol use: No    Comment: socially=occasional   . Drug use: No  . Sexual activity: Yes    Birth control/protection: Condom

## 2020-06-15 ENCOUNTER — Other Ambulatory Visit: Payer: Self-pay | Admitting: Internal Medicine

## 2020-06-15 NOTE — Telephone Encounter (Signed)
Needs rov 

## 2020-06-25 ENCOUNTER — Ambulatory Visit: Payer: BC Managed Care – PPO | Admitting: Internal Medicine

## 2020-06-26 ENCOUNTER — Ambulatory Visit: Payer: BC Managed Care – PPO | Admitting: Podiatry

## 2020-06-26 ENCOUNTER — Encounter: Payer: Self-pay | Admitting: Podiatry

## 2020-06-26 ENCOUNTER — Other Ambulatory Visit: Payer: Self-pay

## 2020-06-26 DIAGNOSIS — M7662 Achilles tendinitis, left leg: Secondary | ICD-10-CM

## 2020-06-26 NOTE — Progress Notes (Signed)
He presents today for follow-up of his Achilles tendinitis left.  States that the boot really helped is much better he is very happy with that.  Objective: Vital signs are stable he is alert and oriented x3 there is no erythema edema cellulitis drainage odor no pain on palpation.  Assessment: Well-healing Achilles tendinitis insertional.  Plan: Continue current therapies and I will follow-up with him on an as-needed basis.

## 2020-06-27 ENCOUNTER — Other Ambulatory Visit (INDEPENDENT_AMBULATORY_CARE_PROVIDER_SITE_OTHER): Payer: BC Managed Care – PPO

## 2020-06-27 ENCOUNTER — Encounter: Payer: BC Managed Care – PPO | Admitting: Endocrinology

## 2020-06-27 ENCOUNTER — Encounter: Payer: Self-pay | Admitting: Endocrinology

## 2020-06-27 DIAGNOSIS — E291 Testicular hypofunction: Secondary | ICD-10-CM | POA: Diagnosis not present

## 2020-06-27 DIAGNOSIS — E1165 Type 2 diabetes mellitus with hyperglycemia: Secondary | ICD-10-CM

## 2020-06-27 LAB — CBC
HCT: 42.2 % (ref 39.0–52.0)
Hemoglobin: 13.4 g/dL (ref 13.0–17.0)
MCHC: 31.7 g/dL (ref 30.0–36.0)
MCV: 81.6 fl (ref 78.0–100.0)
Platelets: 204 10*3/uL (ref 150.0–400.0)
RBC: 5.17 Mil/uL (ref 4.22–5.81)
RDW: 14.5 % (ref 11.5–15.5)
WBC: 7.6 10*3/uL (ref 4.0–10.5)

## 2020-06-27 LAB — TESTOSTERONE: Testosterone: 287.22 ng/dL — ABNORMAL LOW (ref 300.00–890.00)

## 2020-06-27 LAB — GLUCOSE, RANDOM: Glucose, Bld: 131 mg/dL — ABNORMAL HIGH (ref 70–99)

## 2020-06-27 NOTE — Progress Notes (Signed)
This encounter was created in error - please disregard.

## 2020-06-27 NOTE — Patient Instructions (Signed)
[id number] 

## 2020-06-30 ENCOUNTER — Encounter: Payer: Self-pay | Admitting: Internal Medicine

## 2020-06-30 ENCOUNTER — Other Ambulatory Visit: Payer: Self-pay

## 2020-06-30 ENCOUNTER — Ambulatory Visit: Payer: BC Managed Care – PPO | Admitting: Endocrinology

## 2020-06-30 ENCOUNTER — Encounter: Payer: Self-pay | Admitting: Endocrinology

## 2020-06-30 VITALS — BP 122/82 | HR 107 | Ht 74.0 in | Wt 361.2 lb

## 2020-06-30 DIAGNOSIS — E1165 Type 2 diabetes mellitus with hyperglycemia: Secondary | ICD-10-CM

## 2020-06-30 DIAGNOSIS — E291 Testicular hypofunction: Secondary | ICD-10-CM | POA: Diagnosis not present

## 2020-06-30 MED ORDER — JATENZO 198 MG PO CAPS: ORAL_CAPSULE | ORAL | 2 refills | Status: DC

## 2020-06-30 NOTE — Patient Instructions (Signed)
Use 3 pumps on 1 side and 2 on other

## 2020-06-30 NOTE — Progress Notes (Signed)
Patient ID: [name], male   DOB: 1965/10/19, 54 y.o.   MRN: [id number]            Chief complaint: Follow-up of low testosterone  History of Present Illness  Hypogonadismwas diagnosed in 2012  He has had complaints offatigue, decreased motivation, decreased libido, and general feeling bad The symptoms started a few years ago and somewhat worse for couple of months before his visit in 7/19 No obvious causes of low testosterone were identified on his initial consultation In 2012 his free testosterone level was low normal at 47 with total 180  Repeat free testosterone was 8.0 done on 01/16/2018 in the morning at 9:30 AM  RECENT history:  Treatment regimen: taking 2 pumps on each arm of the AndroGel  With using clomiphene he did not have adequate response of the testosterone level and also LH had gone above normal  He has been on AndroGel 1.62% since 01/2018 He had been titrated up to 4 pumps a day with previously adequate replacement  Do not think he has recurrence of his fatigue or lethargy  He is applying the AndroGel regularly every morning on his upper arm after his shower and is allowing the gel to get dry Although he says he is not missing any doses recently may have missed doses occasionally when he was going through stressful times in the last few months  He has been regular with his prescriptions  His lab results showtestosterone level to be 287 compared to 422  Hemoglobin has been consistently normal   Lab Results  Component Value Date   TESTOSTERONE 287.22 (L) 06/27/2020   TESTOSTERONE 421.57 12/20/2019   TESTOSTERONE 445.28 06/20/2019   TESTOSTERONE 164.72 (L) 03/05/2019    Baseline prolactin level: 8.3  Baseline LH of 7.4  Lab Results  Component Value Date   LH 13.04 (H) 03/29/2018   Lab Results  Component Value Date   HGB 13.4 06/27/2020   DIABETES: He has had type 2 diabetes treated by his PCP with metformin  Diagnosis initially was in 2014  with an A1c of 10.9  Followed by PCP last in 5/21 when phentermine was added He says he was feeling excessively hot with this and stopped this for a couple of months and only started back a week ago Previously did not have any benefit from going to the weight management program  He has not made a follow-up with his PCP as recommended to recheck his A1c which was high Recently not exercising but he thinks he is eating small portions  Wt Readings from Last 3 Encounters:  06/30/20 (!) 361 lb 3.2 oz (163.8 kg)  06/27/20 (!) 363 lb 3.2 oz (164.7 kg)  04/05/20 (!) 357 lb (161.9 kg)    Lab Results  Component Value Date   HGBA1C 8.1 (H) 11/16/2019   HGBA1C 6.3 (H) 07/16/2019   HGBA1C 6.5 03/05/2019   Lab Results  Component Value Date   MICROALBUR <0.7 11/16/2019   [place] 74 11/16/2019   CREATININE 0.98 11/16/2019       Allergies as of 06/30/2020   No Known Allergies     Medication List       Accurate as of June 30, 2020  9:17 AM. If you have any questions, ask your nurse or doctor.        atorvastatin 20 MG tablet Commonly known as: LIPITOR Take 20 mg by mouth daily.   buPROPion 150 MG 12 hr tablet Commonly known as: WELLBUTRIN SR Take 150  mg by mouth daily.   busPIRone 10 MG tablet Commonly known as: BUSPAR Take 10 mg by mouth 3 (three) times daily.   busPIRone 15 MG tablet Commonly known as: BUSPAR Take 15 mg by mouth at bedtime.   cromolyn 4 % ophthalmic solution Commonly known as: OPTICROM INSTILL 1 DROP INTO EACH EYE 4 TIMES DAILY AT REGULAR INTERVALS.   cyclobenzaprine 5 MG tablet Commonly known as: FLEXERIL Take 1 tablet (5 mg total) by mouth 3 (three) times daily as needed for muscle spasms.   diclofenac sodium 1 % Gel Commonly known as: Voltaren Apply 4 g topically 4 (four) times daily as needed.   dicyclomine 10 MG capsule Commonly known as: BENTYL   doxycycline 100 MG tablet Commonly known as: VIBRA-TABS Take 1 tablet (100 mg total)  by mouth 2 (two) times daily.   DULoxetine 30 MG capsule Commonly known as: CYMBALTA Take 30 mg by mouth daily.   escitalopram 20 MG tablet Commonly known as: Lexapro Take 1 tablet (20 mg total) by mouth daily.   fluticasone 50 MCG/ACT nasal spray Commonly known as: FLONASE SPRAY 2 SPRAYS INTO EACH NOSTRIL EVERY DAY   gabapentin 300 MG capsule Commonly known as: NEURONTIN Take one AM, one PM, two QHS   HYDROcodone-acetaminophen 5-325 MG tablet Commonly known as: NORCO/VICODIN Take 1 tablet by mouth every 6 (six) hours as needed for moderate pain.   hyoscyamine 0.125 MG SL tablet Commonly known as: LEVSIN SL Place 1 tablet (0.125 mg total) under the tongue every 4 (four) hours as needed for up to 10 days.   indomethacin 50 MG capsule Commonly known as: INDOCIN TAKE ONE CAPSULE EVERY 6 HOURS AS NEEDED GOUT ONLY   loperamide 2 MG capsule Commonly known as: IMODIUM   meloxicam 7.5 MG tablet Commonly known as: MOBIC Take 7.5 mg by mouth daily.   meloxicam 15 MG tablet Commonly known as: MOBIC Take 1 tablet (15 mg total) by mouth daily.   meloxicam 15 MG tablet Commonly known as: Mobic 1 po q d prn   metFORMIN 500 MG tablet Commonly known as: GLUCOPHAGE TAKE 2 TABLETS BY MOUTH TWICE A DAY   metoprolol tartrate 25 MG tablet Commonly known as: LOPRESSOR   olmesartan 20 MG tablet Commonly known as: BENICAR TAKE 1 TABLET (20 MG TOTAL) BY MOUTH DAILY. ANNUAL APPT DUE IN SEPT MUST SEE PROVIDER FOR FUTURE REFILLS   omeprazole 40 MG capsule Commonly known as: PRILOSEC TAKE 1 CAPSULE BY MOUTH EVERY DAY   ondansetron 4 MG tablet Commonly known as: Zofran Take 1 tablet (4 mg total) by mouth every 8 (eight) hours as needed for nausea or vomiting.   oxyCODONE-acetaminophen 10-325 MG tablet Commonly known as: PERCOCET Take 1 tablet by mouth every 8 (eight) hours as needed for pain.   phentermine 37.5 MG capsule Take 1 capsule (37.5 mg total) by mouth every morning.    prazosin 2 MG capsule Commonly known as: Minipress Take 1 capsule (2 mg total) by mouth at bedtime.   QUEtiapine 25 MG tablet Commonly known as: SEROQUEL Take 25 mg by mouth at bedtime.   sildenafil 100 MG tablet Commonly known as: VIAGRA Take by mouth.   tadalafil 20 MG tablet Commonly known as: CIALIS Take 0.5-1 tablets (10-20 mg total) by mouth every other day as needed for erectile dysfunction.   Testosterone 20.25 MG/ACT (1.62%) Gel USE 2 PUMPS ON EACH ARM   traZODone 50 MG tablet Commonly known as: DESYREL Take 1 tablet (50 mg total)  by mouth at bedtime.   vardenafil 20 MG tablet Commonly known as: Levitra Take 1 tablet (20 mg total) by mouth daily as needed for erectile dysfunction.   Vitamin D (Ergocalciferol) 1.25 MG (50000 UNIT) Caps capsule Commonly known as: DRISDOL Take 1 capsule (50,000 Units total) by mouth every 7 (seven) days.   Xiidra 5 % Soln Generic drug: Lifitegrast INSTILL 1 DROP INTO BOTH EYES TWICE A DAY       Allergies: No Known Allergies  Past Medical History:  Diagnosis Date  . Acid reflux   . Alcohol abuse   . Anxiety   . Arthritis   . Depression 07/08/2015  . Diabetes mellitus ([place])   . Diabetes mellitus, type II ([place])   . DJD (degenerative joint disease)   . Gallbladder problem   . Gallstones   . GERD (gastroesophageal reflux disease)   . Gout   . HTN (hypertension)   . Hyperglycemia   . Hyperlipidemia   . IBS (irritable bowel syndrome)   . Joint pain   . Low testosterone in male   . Male hypogonadism 07/08/2015  . Obesity   . Osteoarthritis   . PONV (postoperative nausea and vomiting)   . Sleep apnea     Past Surgical History:  Procedure Laterality Date  . Back Cyst Excision     x2  . CHOLECYSTECTOMY  08/11/2011   Procedure: LAPAROSCOPIC CHOLECYSTECTOMY WITH INTRAOPERATIVE CHOLANGIOGRAM;  Surgeon: Judieth Keens, DO;  Location: [place] [facility];  Service: General;  Laterality: N/A;  Laparoscopic cholecystectomy with  cholangiogram.  . COLONOSCOPY WITH PROPOFOL N/A 09/15/2015   Procedure: COLONOSCOPY WITH PROPOFOL;  Surgeon: Gatha Mayer, MD;  Location: [facility];  Service: Endoscopy;  Laterality: N/A;  . JOINT REPLACEMENT  01/26/2011   right knee  . KNEE ARTHROSCOPY     x 2 left  . KNEE ARTHROSCOPY     x 2 right  . REPAIR KNEE LIGAMENT     Left  . RIGHT FOOT     BONES STRAIGHTENED OUT   2006  . Shoulder Surgery, Reconstruction of Joint     Left x 3, Last 02/2007  . Tennis elbow surgery, Nerve entrapment     x2 right    Family History  Problem Relation Age of Onset  . Sarcoidosis Mother   . High Cholesterol Mother   . Obesity Mother   . Colon cancer Brother 18       half-brother  . Ovarian cancer Maternal Grandmother   . Cancer Neg Hx   . Heart disease Neg Hx   . Kidney disease Neg Hx     Social History:  reports that he has never smoked. He has never used smokeless tobacco. He reports that he does not drink alcohol and does not use drugs.  Review of Systems   Hypertension, treated with Benicar and Minipress, followed by PCP  BP Readings from Last 3 Encounters:  06/30/20 122/82  06/27/20 122/84  01/16/20 102/80     General Examination:   BP 122/82   Pulse (!) 107   Ht 6\' 2"  (1.88 m)   Wt (!) 361 lb 3.2 oz (163.8 kg)   SpO2 96%   BMI 46.38 kg/m      Assessment/ Plan:  Hypogonadism, diagnosed around 2016  He had symptomatic hypogonadism  He has been treated with AndroGel 1.62%, 2 pumps on each upper arm/shoulder He is reporting that he is quite regular with his testosterone gel and has been using it with the  proper technique and location He is not symptomatic and not clear why his testosterone level is recently lower  Unclear whether he has missed any doses but he does not think he has been irregular recently Will continue the same prescription but increase the dose to 5 pumps daily  We will also try to get prior authorization for Gaynelle Cage which will be much more  practical and allow better absorption consistently He is very reluctant to do injections Recheck in 6 weeks   DIABETES with recent BMI about 46: Poorly controlled on Metformin only Last A1c 8.1 indicating likely postprandial hyperglycemia  No benefit with phentermine and he needs to follow-up with PCP Also because of his tachycardia will recommend not taking phentermine  If his PCP is requesting diabetes consultation will review the treatment on the next visit  Elayne Snare 06/30/2020, 9:17 AM     Note: This office note was prepared with Dragon voice recognition system technology. Any transcriptional errors that result from this process are unintentional.

## 2020-07-01 ENCOUNTER — Encounter: Payer: Self-pay | Admitting: Internal Medicine

## 2020-07-01 ENCOUNTER — Ambulatory Visit (INDEPENDENT_AMBULATORY_CARE_PROVIDER_SITE_OTHER): Payer: BC Managed Care – PPO | Admitting: Internal Medicine

## 2020-07-01 ENCOUNTER — Other Ambulatory Visit: Payer: Self-pay

## 2020-07-01 ENCOUNTER — Other Ambulatory Visit: Payer: Self-pay | Admitting: Internal Medicine

## 2020-07-01 VITALS — BP 110/70 | HR 95 | Temp 98.7°F | Ht 74.0 in | Wt 360.0 lb

## 2020-07-01 DIAGNOSIS — E538 Deficiency of other specified B group vitamins: Secondary | ICD-10-CM

## 2020-07-01 DIAGNOSIS — E1165 Type 2 diabetes mellitus with hyperglycemia: Secondary | ICD-10-CM | POA: Diagnosis not present

## 2020-07-01 DIAGNOSIS — I10 Essential (primary) hypertension: Secondary | ICD-10-CM

## 2020-07-01 DIAGNOSIS — E7849 Other hyperlipidemia: Secondary | ICD-10-CM

## 2020-07-01 DIAGNOSIS — E291 Testicular hypofunction: Secondary | ICD-10-CM

## 2020-07-01 DIAGNOSIS — U071 COVID-19: Secondary | ICD-10-CM | POA: Diagnosis not present

## 2020-07-01 LAB — LIPID PANEL
Cholesterol: 103 mg/dL (ref 0–200)
HDL: 35.6 mg/dL — ABNORMAL LOW (ref >39.00)
LDL Cholesterol: 55 mg/dL (ref 0–99)
NonHDL: 67.17
Total CHOL/HDL Ratio: 3
Triglycerides: 60 mg/dL (ref 0.0–149.0)
VLDL: 12 mg/dL (ref 0.0–40.0)

## 2020-07-01 LAB — BASIC METABOLIC PANEL
BUN: 10 mg/dL (ref 6–23)
CO2: 31 mEq/L (ref 19–32)
Calcium: 9.5 mg/dL (ref 8.4–10.5)
Chloride: 100 mEq/L (ref 96–112)
Creatinine, Ser: 1.07 mg/dL (ref 0.40–1.50)
GFR: 78.07 mL/min (ref >60.00)
Glucose, Bld: 118 mg/dL — ABNORMAL HIGH (ref 70–99)
Potassium: 4.6 mEq/L (ref 3.5–5.1)
Sodium: 137 mEq/L (ref 135–145)

## 2020-07-01 LAB — HEPATIC FUNCTION PANEL
ALT: 28 U/L (ref 0–53)
AST: 17 U/L (ref 0–37)
Albumin: 4.4 g/dL (ref 3.5–5.2)
Alkaline Phosphatase: 58 U/L (ref 39–117)
Bilirubin, Direct: 0.2 mg/dL (ref 0.0–0.3)
Total Bilirubin: 1.1 mg/dL (ref 0.2–1.2)
Total Protein: 7.9 g/dL (ref 6.0–8.3)

## 2020-07-01 LAB — VITAMIN B12: Vitamin B-12: 302 pg/mL (ref 211–911)

## 2020-07-01 LAB — HEMOGLOBIN A1C: Hgb A1c MFr Bld: 7.2 % — ABNORMAL HIGH (ref 4.6–6.5)

## 2020-07-01 MED ORDER — VICTOZA 18 MG/3ML ~~LOC~~ SOPN: 1.8000 mg | PEN_INJECTOR | Freq: Every day | SUBCUTANEOUS | 3 refills | Status: DC

## 2020-07-01 MED ORDER — VICTOZA 18 MG/3ML ~~LOC~~ SOPN: PEN_INJECTOR | SUBCUTANEOUS | 0 refills | Status: DC
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
## 2020-07-01 NOTE — Patient Instructions (Signed)

## 2020-07-02 NOTE — Telephone Encounter (Signed)
   Patient wants to know if he should continue the Metformin  Please advise

## 2020-07-02 NOTE — Telephone Encounter (Signed)
Yes to metformin also

## 2020-07-08 ENCOUNTER — Encounter: Payer: Self-pay | Admitting: Internal Medicine

## 2020-07-08 NOTE — Assessment & Plan Note (Signed)
Lab Results  Component Value Date   VITAMINB12 302 07/01/2020  cont oral replacement

## 2020-07-08 NOTE — Assessment & Plan Note (Signed)
Cont to f/u with endo

## 2020-07-08 NOTE — Assessment & Plan Note (Signed)
stable overall by history and exam, recent data reviewed with pt, and pt to continue medical treatment as before,  to f/u any worsening symptoms or concerns BP Readings from Last 3 Encounters:  07/01/20 110/70  06/30/20 122/82  06/27/20 122/84
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
## 2020-07-08 NOTE — Assessment & Plan Note (Signed)
Resolved but with fatigue possible long haul symptoms, cont to follow

## 2020-07-08 NOTE — Assessment & Plan Note (Signed)
Lab Results  Component Value Date   LDLCALC 55 07/01/2020  stable overall by history and exam, recent data reviewed with pt, and pt to continue medical treatment as before,  to f/u any worsening symptoms or concerns

## 2020-07-08 NOTE — Assessment & Plan Note (Signed)
Lab Results  Component Value Date   HGBA1C 7.2 (H) 07/01/2020   stable overall by history and exam, recent data reviewed with pt, and pt to continue medical treatment as before,  to f/u any worsening symptoms or concerns  

## 2020-08-05 ENCOUNTER — Encounter (INDEPENDENT_AMBULATORY_CARE_PROVIDER_SITE_OTHER): Payer: BC Managed Care – PPO | Admitting: Ophthalmology

## 2020-08-05 ENCOUNTER — Other Ambulatory Visit: Payer: Self-pay

## 2020-08-05 DIAGNOSIS — E113391 Type 2 diabetes mellitus with moderate nonproliferative diabetic retinopathy without macular edema, right eye: Secondary | ICD-10-CM

## 2020-08-05 DIAGNOSIS — H35373 Puckering of macula, bilateral: Secondary | ICD-10-CM

## 2020-08-05 DIAGNOSIS — H43811 Vitreous degeneration, right eye: Secondary | ICD-10-CM

## 2020-08-05 DIAGNOSIS — I10 Essential (primary) hypertension: Secondary | ICD-10-CM | POA: Diagnosis not present

## 2020-08-05 DIAGNOSIS — H35033 Hypertensive retinopathy, bilateral: Secondary | ICD-10-CM

## 2020-08-06 ENCOUNTER — Other Ambulatory Visit (INDEPENDENT_AMBULATORY_CARE_PROVIDER_SITE_OTHER): Payer: BC Managed Care – PPO

## 2020-08-06 DIAGNOSIS — E291 Testicular hypofunction: Secondary | ICD-10-CM | POA: Diagnosis not present

## 2020-08-06 LAB — TESTOSTERONE: Testosterone: 438.82 ng/dL (ref 300.00–890.00)

## 2020-08-12 ENCOUNTER — Other Ambulatory Visit: Payer: Self-pay

## 2020-08-12 ENCOUNTER — Ambulatory Visit: Payer: BC Managed Care – PPO | Admitting: Endocrinology

## 2020-08-12 ENCOUNTER — Encounter: Payer: Self-pay | Admitting: Endocrinology

## 2020-08-12 VITALS — BP 118/74 | HR 93 | Ht 74.0 in | Wt 347.4 lb

## 2020-08-12 DIAGNOSIS — E291 Testicular hypofunction: Secondary | ICD-10-CM | POA: Diagnosis not present

## 2020-08-12 NOTE — Progress Notes (Signed)
Patient ID: [name], male   DOB: January 03, 1966, 55 y.o.   MRN: [id number]            Chief complaint: Follow-up of low testosterone  History of Present Illness  Hypogonadismwas diagnosed in 2012  He has had complaints offatigue, decreased motivation, decreased libido, and general feeling bad The symptoms started a few years ago and somewhat worse for couple of months before his visit in 7/19 No obvious causes of low testosterone were identified on his initial consultation In 2012 his free testosterone level was low normal at 47 with total 180  Repeat free testosterone was 8.0 done on 01/16/2018 in the morning at 9:30 AM  RECENT history:  Treatment regimen: taking 2 pumps on each arm of the AndroGel  With using clomiphene he did not have adequate response of the testosterone level and also LH had gone above normal  He has been on AndroGel 1.62% since 01/2018  His testosterone level was below 300 in December 2021 and he apparently had not missed any doses of his testosterone before his labs were drawn For this reason his dose was increased to 5 pumps daily He is applying 3 pounds on one side and 2 on the other after his shower daily  He is feeling fairly good with only some fatigue late in the evening but otherwise has normal energy level and motivation  His testosterone level is back up over 400  Hemoglobin has been consistently normal   Lab Results  Component Value Date   TESTOSTERONE 438.82 08/06/2020   TESTOSTERONE 287.22 (L) 06/27/2020   TESTOSTERONE 421.57 12/20/2019   TESTOSTERONE 445.28 06/20/2019    Baseline prolactin level: 8.3  Baseline LH of 7.4  Lab Results  Component Value Date   LH 13.04 (H) 03/29/2018   Lab Results  Component Value Date   HGB 13.4 06/27/2020   DIABETES: He has had type 2 diabetes treated by his PCP with metformin  Diagnosis initially was in 2014 with an A1c of 10.9  Previously did not have any benefit from going to the weight  management program  He has been told by his PCP to start Victoza but he preferred to change his diet and is cutting out carbohydrates Currently on Metformin only His A1c has come down to 7.2 compared to 8.1  Since he wanted to try improving his diet and getting some weight loss he has not started the Victoza that his PCP recommended Referral has not been done for diabetes management from his PCP  Wt Readings from Last 3 Encounters:  08/12/20 (!) 347 lb 6.4 oz (157.6 kg)  07/01/20 (!) 360 lb (163.3 kg)  06/30/20 (!) 361 lb 3.2 oz (163.8 kg)    Lab Results  Component Value Date   HGBA1C 7.2 (H) 07/01/2020   HGBA1C 8.1 (H) 11/16/2019   HGBA1C 6.3 (H) 07/16/2019   Lab Results  Component Value Date   MICROALBUR <0.7 11/16/2019   [place] 55 07/01/2020   CREATININE 1.07 07/01/2020       Allergies as of 08/12/2020   No Known Allergies     Medication List       Accurate as of August 12, 2020  2:25 PM. If you have any questions, ask your nurse or doctor.        atorvastatin 20 MG tablet Commonly known as: LIPITOR Take 20 mg by mouth daily.   buPROPion 150 MG 12 hr tablet Commonly known as: WELLBUTRIN SR Take 150 mg by mouth daily.  busPIRone 10 MG tablet Commonly known as: BUSPAR Take 10 mg by mouth 3 (three) times daily.   busPIRone 15 MG tablet Commonly known as: BUSPAR Take 15 mg by mouth at bedtime.   cromolyn 4 % ophthalmic solution Commonly known as: OPTICROM INSTILL 1 DROP INTO EACH EYE 4 TIMES DAILY AT REGULAR INTERVALS.   cyclobenzaprine 5 MG tablet Commonly known as: FLEXERIL Take 1 tablet (5 mg total) by mouth 3 (three) times daily as needed for muscle spasms.   diclofenac sodium 1 % Gel Commonly known as: Voltaren Apply 4 g topically 4 (four) times daily as needed.   dicyclomine 10 MG capsule Commonly known as: BENTYL   doxycycline 100 MG tablet Commonly known as: VIBRA-TABS Take 1 tablet (100 mg total) by mouth 2 (two) times daily.    DULoxetine 30 MG capsule Commonly known as: CYMBALTA Take 30 mg by mouth daily.   escitalopram 20 MG tablet Commonly known as: Lexapro Take 1 tablet (20 mg total) by mouth daily.   fluticasone 50 MCG/ACT nasal spray Commonly known as: FLONASE SPRAY 2 SPRAYS INTO EACH NOSTRIL EVERY DAY   gabapentin 300 MG capsule Commonly known as: NEURONTIN Take one AM, one PM, two QHS   HYDROcodone-acetaminophen 5-325 MG tablet Commonly known as: NORCO/VICODIN Take 1 tablet by mouth every 6 (six) hours as needed for moderate pain.   hyoscyamine 0.125 MG SL tablet Commonly known as: LEVSIN SL Place 1 tablet (0.125 mg total) under the tongue every 4 (four) hours as needed for up to 10 days.   indomethacin 50 MG capsule Commonly known as: INDOCIN TAKE ONE CAPSULE EVERY 6 HOURS AS NEEDED GOUT ONLY   Jatenzo 198 MG Caps Generic drug: Testosterone Undecanoate 1 capsule with breakfast and 1 capsule with dinner   loperamide 2 MG capsule Commonly known as: IMODIUM   meloxicam 7.5 MG tablet Commonly known as: MOBIC Take 7.5 mg by mouth daily.   meloxicam 15 MG tablet Commonly known as: MOBIC Take 1 tablet (15 mg total) by mouth daily.   meloxicam 15 MG tablet Commonly known as: Mobic 1 po q d prn   metFORMIN 500 MG tablet Commonly known as: GLUCOPHAGE TAKE 2 TABLETS BY MOUTH TWICE A DAY   metoprolol tartrate 25 MG tablet Commonly known as: LOPRESSOR   olmesartan 20 MG tablet Commonly known as: BENICAR TAKE 1 TABLET (20 MG TOTAL) BY MOUTH DAILY. ANNUAL APPT DUE IN SEPT MUST SEE PROVIDER FOR FUTURE REFILLS   omeprazole 40 MG capsule Commonly known as: PRILOSEC TAKE 1 CAPSULE BY MOUTH EVERY DAY   ondansetron 4 MG tablet Commonly known as: Zofran Take 1 tablet (4 mg total) by mouth every 8 (eight) hours as needed for nausea or vomiting.   oxyCODONE-acetaminophen 10-325 MG tablet Commonly known as: PERCOCET Take 1 tablet by mouth every 8 (eight) hours as needed for pain.    phentermine 37.5 MG capsule Take 1 capsule (37.5 mg total) by mouth every morning.   prazosin 2 MG capsule Commonly known as: Minipress Take 1 capsule (2 mg total) by mouth at bedtime.   QUEtiapine 25 MG tablet Commonly known as: SEROQUEL Take 25 mg by mouth at bedtime.   sildenafil 100 MG tablet Commonly known as: VIAGRA Take by mouth.   tadalafil 20 MG tablet Commonly known as: CIALIS Take 0.5-1 tablets (10-20 mg total) by mouth every other day as needed for erectile dysfunction.   Testosterone 20.25 MG/ACT (1.62%) Gel USE 2 PUMPS ON EACH ARM   traZODone  50 MG tablet Commonly known as: DESYREL Take 1 tablet (50 mg total) by mouth at bedtime.   vardenafil 20 MG tablet Commonly known as: Levitra Take 1 tablet (20 mg total) by mouth daily as needed for erectile dysfunction.   Victoza 18 MG/3ML Sopn Generic drug: liraglutide Start 0.6mg  SQ once a day for 7 days, then increase to 1.2mg  once a day   Victoza 18 MG/3ML Sopn Generic drug: liraglutide Inject 1.8 mg into the skin daily.   Vitamin D (Ergocalciferol) 1.25 MG (50000 UNIT) Caps capsule Commonly known as: DRISDOL Take 1 capsule (50,000 Units total) by mouth every 7 (seven) days.   Xiidra 5 % Soln Generic drug: Lifitegrast INSTILL 1 DROP INTO BOTH EYES TWICE A DAY       Allergies: No Known Allergies  Past Medical History:  Diagnosis Date  . Acid reflux   . Alcohol abuse   . Anxiety   . Arthritis   . Depression 07/08/2015  . Diabetes mellitus ([place])   . Diabetes mellitus, type II ([place])   . DJD (degenerative joint disease)   . Gallbladder problem   . Gallstones   . GERD (gastroesophageal reflux disease)   . Gout   . HTN (hypertension)   . Hyperglycemia   . Hyperlipidemia   . IBS (irritable bowel syndrome)   . Joint pain   . Low testosterone in male   . Male hypogonadism 07/08/2015  . Obesity   . Osteoarthritis   . PONV (postoperative nausea and vomiting)   . Sleep apnea     Past Surgical  History:  Procedure Laterality Date  . Back Cyst Excision     x2  . CHOLECYSTECTOMY  08/11/2011   Procedure: LAPAROSCOPIC CHOLECYSTECTOMY WITH INTRAOPERATIVE CHOLANGIOGRAM;  Surgeon: Judieth Keens, DO;  Location: [facility];  Service: General;  Laterality: N/A;  Laparoscopic cholecystectomy with cholangiogram.  . COLONOSCOPY WITH PROPOFOL N/A 09/15/2015   Procedure: COLONOSCOPY WITH PROPOFOL;  Surgeon: Gatha Mayer, MD;  Location: [facility];  Service: Endoscopy;  Laterality: N/A;  . JOINT REPLACEMENT  01/26/2011   right knee  . KNEE ARTHROSCOPY     x 2 left  . KNEE ARTHROSCOPY     x 2 right  . REPAIR KNEE LIGAMENT     Left  . RIGHT FOOT     BONES STRAIGHTENED OUT   2006  . Shoulder Surgery, Reconstruction of Joint     Left x 3, Last 02/2007  . Tennis elbow surgery, Nerve entrapment     x2 right    Family History  Problem Relation Age of Onset  . Sarcoidosis Mother   . High Cholesterol Mother   . Obesity Mother   . Colon cancer Brother 24       half-brother  . Ovarian cancer Maternal Grandmother   . Cancer Neg Hx   . Heart disease Neg Hx   . Kidney disease Neg Hx     Social History:  reports that he has never smoked. He has never used smokeless tobacco. He reports that he does not drink alcohol and does not use drugs.  Review of Systems   Hypertension, treated with Benicar and Minipress, followed by PCP  BP Readings from Last 3 Encounters:  08/12/20 118/74  07/01/20 110/70  06/30/20 122/82   Weight history:  Wt Readings from Last 3 Encounters:  08/12/20 (!) 347 lb 6.4 oz (157.6 kg)  07/01/20 (!) 360 lb (163.3 kg)  06/30/20 (!) 361 lb 3.2 oz (163.8 kg)  General Examination:   BP 118/74   Pulse 93   Ht 6\' 2"  (1.88 m)   Wt (!) 347 lb 6.4 oz (157.6 kg)   SpO2 97%   BMI 44.60 kg/m      Assessment/ Plan:  Hypogonadism, diagnosed around 2016  He had symptomatic hypogonadism  He has been treated with AndroGel 1.62%, total 5 pumps daily now  With  regular use of AndroGel and using a higher dose his testosterone level is back up to over 400 He is also subjectively doing well He is asking about his new pituitary function and discussed that he has insulin resistance syndrome  Discussed that he needs testosterone replacement therapy because of inability for his testicles to make testosterone and he will need this lifelong Because of the inconvenience of applying 5 pumps a day which also leaves a residual and also the variability in his testosterone level he will benefit from using Jatenzo and prior transition will be done   DIABETES with A1c 7.2 Followed by PCP on Metformin only currently  If his PCP is requesting diabetes consultation will review the treatment on the next visit  Elayne Snare 08/12/2020, 2:25 PM     Note: This office note was prepared with Dragon voice recognition system technology. Any transcriptional errors that result from this process are unintentional.

## 2020-08-13 ENCOUNTER — Other Ambulatory Visit: Payer: Self-pay | Admitting: Internal Medicine

## 2020-08-13 NOTE — Telephone Encounter (Signed)
Please refill as per office routine med refill policy (all routine meds refilled for 3 mo or monthly per pt preference up to one year from last visit, then month to month grace period for 3 mo, then further med refills will have to be denied)  

## 2020-09-12 ENCOUNTER — Other Ambulatory Visit: Payer: Self-pay | Admitting: Endocrinology

## 2020-09-12 NOTE — Telephone Encounter (Signed)
Last OV 2/22. Please advise

## 2020-09-13 NOTE — Telephone Encounter (Signed)
He is supposed to switch to [employer].  Please find out if this was filled at the pharmacy

## 2020-09-16 ENCOUNTER — Telehealth: Payer: Self-pay | Admitting: Endocrinology

## 2020-09-16 NOTE — Telephone Encounter (Signed)
Pt's testosterone gel needs PA. Please send to:  [id number] - [place], [place] Phone:  [phone]  Fax:  [phone]

## 2020-09-17 NOTE — Telephone Encounter (Signed)
Notified pt Rx Jatenzo--is not covered by insurance per Pharmacy, but the Gel is covered and needed refill. Pt requesting for the Gel--insurance will cover 80 percent, Please advise

## 2020-09-17 NOTE — Telephone Encounter (Signed)
Pt called back- states he is on last bottle. I informed pt PA takes about 7-10 business days.

## 2020-09-22 ENCOUNTER — Other Ambulatory Visit: Payer: Self-pay | Admitting: *Deleted

## 2020-09-22 DIAGNOSIS — E291 Testicular hypofunction: Secondary | ICD-10-CM

## 2020-09-22 NOTE — Telephone Encounter (Signed)
Called pharmacy-stated no need PA and verified Pt pick -up testoterone gel 09/18/20

## 2020-09-29 ENCOUNTER — Telehealth: Payer: Self-pay | Admitting: Internal Medicine

## 2020-09-29 MED ORDER — INDOMETHACIN 50 MG PO CAPS: ORAL_CAPSULE | ORAL | 2 refills | Status: DC

## 2020-09-29 NOTE — Telephone Encounter (Signed)
[place] for this, but dont take with the mobic prn

## 2020-09-29 NOTE — Addendum Note (Signed)
Addended by: Biagio Borg on: 09/29/2020 01:23 PM   Modules accepted: Orders

## 2020-09-29 NOTE — Telephone Encounter (Signed)
Medication has not been written since 2019.  Pt states he needs for gout.

## 2020-09-29 NOTE — Telephone Encounter (Signed)
Pt notified that Dr Jenny Reichmann sent in medication as requested & instructed not to take the Mobic with it.  Pt verb understanding.

## 2020-09-29 NOTE — Telephone Encounter (Signed)
  indomethacin (INDOCIN) 50 MG capsule [id number] - [place], [place] - [address] Phone:  [phone]  Fax:  [phone]     Last seen- 02.01.22 Next apt- 06.23.22

## 2020-10-13 ENCOUNTER — Other Ambulatory Visit: Payer: Self-pay

## 2020-10-13 ENCOUNTER — Other Ambulatory Visit: Payer: Self-pay | Admitting: Internal Medicine

## 2020-10-13 MED ORDER — INDOMETHACIN 50 MG PO CAPS
ORAL_CAPSULE | ORAL | 2 refills | Status: AC
Start: 2020-10-13 — End: ?

## 2020-12-02 ENCOUNTER — Ambulatory Visit: Payer: BC Managed Care – PPO | Admitting: Podiatry

## 2020-12-16 ENCOUNTER — Ambulatory Visit: Payer: Medicare Other | Admitting: Podiatry

## 2021-01-01 ENCOUNTER — Other Ambulatory Visit (INDEPENDENT_AMBULATORY_CARE_PROVIDER_SITE_OTHER): Payer: BC Managed Care – PPO

## 2021-01-01 ENCOUNTER — Ambulatory Visit (INDEPENDENT_AMBULATORY_CARE_PROVIDER_SITE_OTHER): Payer: BC Managed Care – PPO | Admitting: Internal Medicine

## 2021-01-01 ENCOUNTER — Other Ambulatory Visit: Payer: Self-pay

## 2021-01-01 ENCOUNTER — Encounter: Payer: Self-pay | Admitting: Internal Medicine

## 2021-01-01 VITALS — BP 120/74 | HR 80 | Temp 97.6°F | Ht 74.0 in | Wt 326.4 lb

## 2021-01-01 DIAGNOSIS — L738 Other specified follicular disorders: Secondary | ICD-10-CM | POA: Insufficient documentation

## 2021-01-01 DIAGNOSIS — F4310 Post-traumatic stress disorder, unspecified: Secondary | ICD-10-CM | POA: Insufficient documentation

## 2021-01-01 DIAGNOSIS — L731 Pseudofolliculitis barbae: Secondary | ICD-10-CM | POA: Insufficient documentation

## 2021-01-01 DIAGNOSIS — H5213 Myopia, bilateral: Secondary | ICD-10-CM | POA: Insufficient documentation

## 2021-01-01 DIAGNOSIS — Z713 Dietary counseling and surveillance: Secondary | ICD-10-CM | POA: Insufficient documentation

## 2021-01-01 DIAGNOSIS — E7849 Other hyperlipidemia: Secondary | ICD-10-CM | POA: Diagnosis not present

## 2021-01-01 DIAGNOSIS — E291 Testicular hypofunction: Secondary | ICD-10-CM | POA: Diagnosis not present

## 2021-01-01 DIAGNOSIS — M1990 Unspecified osteoarthritis, unspecified site: Secondary | ICD-10-CM | POA: Insufficient documentation

## 2021-01-01 DIAGNOSIS — D490 Neoplasm of unspecified behavior of digestive system: Secondary | ICD-10-CM | POA: Insufficient documentation

## 2021-01-01 DIAGNOSIS — E1165 Type 2 diabetes mellitus with hyperglycemia: Secondary | ICD-10-CM | POA: Diagnosis not present

## 2021-01-01 DIAGNOSIS — K589 Irritable bowel syndrome without diarrhea: Secondary | ICD-10-CM | POA: Insufficient documentation

## 2021-01-01 DIAGNOSIS — L730 Acne keloid: Secondary | ICD-10-CM | POA: Insufficient documentation

## 2021-01-01 DIAGNOSIS — Z1159 Encounter for screening for other viral diseases: Secondary | ICD-10-CM

## 2021-01-01 DIAGNOSIS — F4312 Post-traumatic stress disorder, chronic: Secondary | ICD-10-CM | POA: Insufficient documentation

## 2021-01-01 DIAGNOSIS — E538 Deficiency of other specified B group vitamins: Secondary | ICD-10-CM

## 2021-01-01 DIAGNOSIS — R7989 Other specified abnormal findings of blood chemistry: Secondary | ICD-10-CM | POA: Insufficient documentation

## 2021-01-01 DIAGNOSIS — I10 Essential (primary) hypertension: Secondary | ICD-10-CM

## 2021-01-01 DIAGNOSIS — R55 Syncope and collapse: Secondary | ICD-10-CM | POA: Diagnosis not present

## 2021-01-01 DIAGNOSIS — E119 Type 2 diabetes mellitus without complications: Secondary | ICD-10-CM | POA: Insufficient documentation

## 2021-01-01 DIAGNOSIS — Z9049 Acquired absence of other specified parts of digestive tract: Secondary | ICD-10-CM | POA: Insufficient documentation

## 2021-01-01 DIAGNOSIS — M79673 Pain in unspecified foot: Secondary | ICD-10-CM | POA: Insufficient documentation

## 2021-01-01 DIAGNOSIS — G4700 Insomnia, unspecified: Secondary | ICD-10-CM | POA: Insufficient documentation

## 2021-01-01 DIAGNOSIS — G4730 Sleep apnea, unspecified: Secondary | ICD-10-CM | POA: Insufficient documentation

## 2021-01-01 DIAGNOSIS — H524 Presbyopia: Secondary | ICD-10-CM | POA: Insufficient documentation

## 2021-01-01 DIAGNOSIS — L678 Other hair color and hair shaft abnormalities: Secondary | ICD-10-CM | POA: Insufficient documentation

## 2021-01-01 DIAGNOSIS — G4733 Obstructive sleep apnea (adult) (pediatric): Secondary | ICD-10-CM | POA: Insufficient documentation

## 2021-01-01 DIAGNOSIS — K219 Gastro-esophageal reflux disease without esophagitis: Secondary | ICD-10-CM | POA: Insufficient documentation

## 2021-01-01 LAB — CBC
HCT: 39.7 % (ref 39.0–52.0)
Hemoglobin: 13 g/dL (ref 13.0–17.0)
MCHC: 32.8 g/dL (ref 30.0–36.0)
MCV: 80.2 fl (ref 78.0–100.0)
Platelets: 197 10*3/uL (ref 150.0–400.0)
RBC: 4.95 Mil/uL (ref 4.22–5.81)
RDW: 15.4 % (ref 11.5–15.5)
WBC: 8.1 10*3/uL (ref 4.0–10.5)

## 2021-01-01 LAB — TESTOSTERONE: Testosterone: 216.12 ng/dL — ABNORMAL LOW (ref 300.00–890.00)

## 2021-01-01 NOTE — Patient Instructions (Signed)
Please continue all other medications as before, and refills have been done if requested. ° °Please have the pharmacy call with any other refills you may need. ° °Please continue your efforts at being more active, low cholesterol diet, and weight control. ° °You are otherwise up to date with prevention measures today. ° °Please keep your appointments with your specialists as you may have planned ° °Please make an Appointment to return in 6 months, or sooner if needed °

## 2021-01-01 NOTE — Progress Notes (Signed)
Patient ID: [name], male   DOB: 12-28-1965, 55 y.o.   MRN: [id number]         Chief Complaint:: yearly exam       HPI:  [name] is a 55 y.o. male overall doing well, and please he has been able to Lost wt with better diet.  Plans to continue to do even more, with a personal goal at least 280.  Unfortunately he had an episode of dizziness then syncope with micturition one night up to the BR; has not happened prior and Pt denies chest pain, increased sob or doe, wheezing, orthopnea, PND, increased LE swelling, palpitations, dizziness or syncope.   Pt denies polydipsia, polyuria, or new focal neuro s/s.  Did have a recent A1c 5.9 per pt.   Pt denies fever, wt loss, night sweats, loss of appetite, or other constitutional symptoms  Denies worsening depressive symptoms, suicidal ideation, or panic.    Wt Readings from Last 3 Encounters:  01/01/21 (!) 326 lb 6.4 oz (148.1 kg)  08/12/20 (!) 347 lb 6.4 oz (157.6 kg)  07/01/20 (!) 360 lb (163.3 kg)   BP Readings from Last 3 Encounters:  01/01/21 120/74  08/12/20 118/74  07/01/20 110/70   Immunization History  Administered Date(s) Administered   Influenza Split 03/12/2016   Influenza,inj,Quad PF,6+ Mos 07/08/2015, 03/15/2017, 09/29/2018, 03/23/2019, 05/15/2020   Influenza-Unspecified 08/13/2007, 06/12/2009, 04/11/2010, 05/27/2020   Moderna Sars-Covid-2 Vaccination 08/28/2019, 09/18/2019, 10/19/2019, 08/07/2020   Pneumococcal Conjugate-13 03/23/2019   Pneumococcal Polysaccharide-23 03/12/2016, 04/27/2016   Zoster Recombinat (Shingrix) 11/16/2019, 01/28/2020, 04/29/2020   Health Maintenance Due  Topic Date Due   Hepatitis C Screening  Never done      Past Medical History:  Diagnosis Date   Acid reflux    Alcohol abuse    Anxiety    Arthritis    Depression 07/08/2015   Diabetes mellitus (HCC)    Diabetes mellitus, type II (HCC)    DJD (degenerative joint disease)    Gallbladder problem    Gallstones    GERD (gastroesophageal  reflux disease)    Gout    HTN (hypertension)    Hyperglycemia    Hyperlipidemia    IBS (irritable bowel syndrome)    Joint pain    Low testosterone in male    Male hypogonadism 07/08/2015   Obesity    Osteoarthritis    PONV (postoperative nausea and vomiting)    Sleep apnea    Past Surgical History:  Procedure Laterality Date   Back Cyst Excision     x2   CHOLECYSTECTOMY  08/11/2011   Procedure: LAPAROSCOPIC CHOLECYSTECTOMY WITH INTRAOPERATIVE CHOLANGIOGRAM;  Surgeon: Judieth Keens, DO;  Location: [facility];  Service: General;  Laterality: N/A;  Laparoscopic cholecystectomy with cholangiogram.   COLONOSCOPY WITH PROPOFOL N/A 09/15/2015   Procedure: COLONOSCOPY WITH PROPOFOL;  Surgeon: Gatha Mayer, MD;  Location: [facility];  Service: Endoscopy;  Laterality: N/A;   JOINT REPLACEMENT  01/26/2011   right knee   KNEE ARTHROSCOPY     x 2 left   KNEE ARTHROSCOPY     x 2 right   REPAIR KNEE LIGAMENT     Left   RIGHT FOOT     BONES STRAIGHTENED OUT   2006   Shoulder Surgery, Reconstruction of Joint     Left x 3, Last 02/2007   Tennis elbow surgery, Nerve entrapment     x2 right    reports that he has never smoked. He has never used smokeless tobacco. He  reports that he does not drink alcohol and does not use drugs. family history includes Colon cancer (age of onset: 10) in his brother; High Cholesterol in his mother; Obesity in his mother; Ovarian cancer in his maternal grandmother; Sarcoidosis in his mother. No Known Allergies Current Outpatient Medications on File Prior to Visit  Medication Sig Dispense Refill   atorvastatin (LIPITOR) 20 MG tablet Take 20 mg by mouth daily.     Benzoyl Peroxide 10 % LIQD WASH AS DIRECTED AFFECTED AREA DAILY  TO THE FACE AND THE BACK OF THE SCALP DAILY     buPROPion (WELLBUTRIN SR) 150 MG 12 hr tablet Take 150 mg by mouth daily.     busPIRone (BUSPAR) 10 MG tablet Take 10 mg by mouth 3 (three) times daily.     busPIRone (BUSPAR) 15 MG tablet  Take 15 mg by mouth at bedtime.     cetirizine (ZYRTEC) 10 MG tablet TAKE ONE TABLET BY MOUTH DAILY FOR ALLERGY     clindamycin (CLEOCIN T) 1 % external solution APPLY SMALL AMOUNT TO AFFECTED AREA DAILY  TO THE [place] REGION  AS NEEDED     cromolyn (OPTICROM) 4 % ophthalmic solution INSTILL 1 DROP INTO EACH EYE 4 TIMES DAILY AT REGULAR INTERVALS.     cyclobenzaprine (FLEXERIL) 5 MG tablet Take 1 tablet (5 mg total) by mouth 3 (three) times daily as needed for muscle spasms. 40 tablet 5   diclofenac sodium (VOLTAREN) 1 % GEL Apply 4 g topically 4 (four) times daily as needed. 400 g 1   dicyclomine (BENTYL) 10 MG capsule      DULoxetine (CYMBALTA) 20 MG capsule Take 2 capsules by mouth 2 (two) times daily.     DULoxetine (CYMBALTA) 30 MG capsule Take 30 mg by mouth daily.     fluticasone (FLONASE) 50 MCG/ACT nasal spray SPRAY 2 SPRAYS INTO EACH NOSTRIL EVERY DAY 48 mL 2   fluticasone (FLONASE) 50 MCG/ACT nasal spray Place into the nose.     gabapentin (NEURONTIN) 300 MG capsule Take one AM, one PM, two QHS 240 capsule 2   HYDROcodone-acetaminophen (NORCO/VICODIN) 5-325 MG tablet Take 1 tablet by mouth every 6 (six) hours as needed for moderate pain. 40 tablet 0   hydrocortisone 2.5 % cream APPLY SMALL AMOUNT TO AFFECTED AREA TWICE A DAY  TO THE [place] REGION AND THE NECK  AS NEEDED FOR FLARES     indomethacin (INDOCIN) 50 MG capsule TAKE ONE CAPSULE EVERY 6 HOURS AS NEEDED GOUT ONLY 60 capsule 2   lamoTRIgine (LAMICTAL) 25 MG tablet Take 2 tablets by mouth daily.     loperamide (IMODIUM) 2 MG capsule      meloxicam (MOBIC) 15 MG tablet Take 1 tablet (15 mg total) by mouth daily. 30 tablet 3   meloxicam (MOBIC) 15 MG tablet 1 po q d prn 30 tablet 3   meloxicam (MOBIC) 7.5 MG tablet Take 7.5 mg by mouth daily.     metFORMIN (GLUCOPHAGE) 500 MG tablet TAKE 2 TABLETS BY MOUTH TWICE A DAY 360 tablet 1   metFORMIN (GLUCOPHAGE) 500 MG tablet Take 1 tablet by mouth 2 (two) times daily.     metoprolol  tartrate (LOPRESSOR) 25 MG tablet      olmesartan (BENICAR) 20 MG tablet TAKE 1 TABLET (20 MG TOTAL) BY MOUTH DAILY. ANNUAL APPT DUE IN SEPT MUST SEE PROVIDER FOR FUTURE REFILLS 90 tablet 1   olmesartan (BENICAR) 20 MG tablet Take 1 tablet by mouth daily.  omeprazole (PRILOSEC) 20 MG capsule Take 1 capsule by mouth daily.     omeprazole (PRILOSEC) 40 MG capsule TAKE 1 CAPSULE BY MOUTH EVERY DAY 90 capsule 2   ondansetron (ZOFRAN) 4 MG tablet Take 1 tablet (4 mg total) by mouth every 8 (eight) hours as needed for nausea or vomiting. 20 tablet 0   oxyCODONE-acetaminophen (PERCOCET) 10-325 MG tablet Take 1 tablet by mouth every 8 (eight) hours as needed for pain. 20 tablet 0   phentermine 37.5 MG capsule Take 1 capsule (37.5 mg total) by mouth every morning. 30 capsule 5   prazosin (MINIPRESS) 2 MG capsule Take 1 capsule (2 mg total) by mouth at bedtime. 30 capsule 1   QUEtiapine (SEROQUEL) 25 MG tablet Take 25 mg by mouth at bedtime.     sildenafil (VIAGRA) 100 MG tablet Take by mouth.     sildenafil (VIAGRA) 100 MG tablet Take by mouth.     tadalafil (CIALIS) 20 MG tablet Take 0.5-1 tablets (10-20 mg total) by mouth every other day as needed for erectile dysfunction. 5 tablet 11   Testosterone 20.25 MG/ACT (1.62%) GEL USE 2 PUMPS ON EACH ARM 450 g 0   Testosterone 20.25 MG/ACT (1.62%) GEL Place onto the skin.     traZODone (DESYREL) 50 MG tablet Take 1 tablet (50 mg total) by mouth at bedtime. 30 tablet 1   vardenafil (LEVITRA) 20 MG tablet Take 1 tablet (20 mg total) by mouth daily as needed for erectile dysfunction. 6 tablet 5   Vitamin D, Ergocalciferol, (DRISDOL) 1.25 MG (50000 UNIT) CAPS capsule Take 1 capsule (50,000 Units total) by mouth every 7 (seven) days. 4 capsule 0   XIIDRA 5 % SOLN INSTILL 1 DROP INTO BOTH EYES TWICE A DAY     Zoster Vaccine Adjuvanted [facility]) injection Inject into the muscle.     clobetasol (TEMOVATE) 0.05 % external solution      escitalopram (LEXAPRO) 20 MG  tablet Take 1 tablet (20 mg total) by mouth daily. 30 tablet 2   hyoscyamine (LEVSIN SL) 0.125 MG SL tablet Place 1 tablet (0.125 mg total) under the tongue every 4 (four) hours as needed for up to 10 days. 30 tablet 5   No current facility-administered medications on file prior to visit.        ROS:  All others reviewed and negative.  Objective        PE:  BP 120/74 (BP Location: Left Arm, Patient Position: Sitting, Cuff Size: Large)   Pulse 80   Temp 97.6 F (36.4 C) (Oral)   Ht 6\' 2"  (1.88 m)   Wt (!) 326 lb 6.4 oz (148.1 kg)   SpO2 96%   BMI 41.91 kg/m                 Constitutional: Pt appears in NAD               HENT: Head: NCAT.                Right Ear: External ear normal.                 Left Ear: External ear normal.                Eyes: . Pupils are equal, round, and reactive to light. Conjunctivae and EOM are normal               Nose: without d/c or deformity  Neck: Neck supple. Gross normal ROM               Cardiovascular: Normal rate and regular rhythm.                 Pulmonary/Chest: Effort normal and breath sounds without rales or wheezing.                Abd:  Soft, NT, ND, + BS, no organomegaly               Neurological: Pt is alert. At baseline orientation, motor grossly intact               Skin: Skin is warm. No rashes, no other new lesions, LE edema - none               Psychiatric: Pt behavior is normal without agitation   Micro: none  Cardiac tracings I have personally interpreted today:  none  Pertinent Radiological findings (summarize): none   Lab Results  Component Value Date   WBC 8.1 01/01/2021   HGB 13.0 01/01/2021   HCT 39.7 01/01/2021   PLT 197.0 01/01/2021   GLUCOSE 118 (H) 07/01/2020   CHOL 103 07/01/2020   TRIG 60.0 07/01/2020   HDL 35.60 (L) 07/01/2020   LDLCALC 55 07/01/2020   ALT 28 07/01/2020   AST 17 07/01/2020   NA 137 07/01/2020   K 4.6 07/01/2020   CL 100 07/01/2020   CREATININE 1.07 07/01/2020   BUN  10 07/01/2020   CO2 31 07/01/2020   TSH 1.43 11/16/2019   PSA 0.17 11/16/2019   INR 0.96 05/14/2011   HGBA1C 7.2 (H) 07/01/2020   MICROALBUR <0.7 11/16/2019   Assessment/Plan:  [name] is a 55 y.o. Black or African American [2] male with  has a past medical history of Acid reflux, Alcohol abuse, Anxiety, Arthritis, Depression (07/08/2015), Diabetes mellitus ([place]), Diabetes mellitus, type II ([place]), DJD (degenerative joint disease), Gallbladder problem, Gallstones, GERD (gastroesophageal reflux disease), Gout, HTN (hypertension), Hyperglycemia, Hyperlipidemia, IBS (irritable bowel syndrome), Joint pain, Low testosterone in male, Male hypogonadism (07/08/2015), Obesity, Osteoarthritis, PONV (postoperative nausea and vomiting), and Sleep apnea.  Diabetes ([place]) Lab Results  Component Value Date   HGBA1C 7.2 (H) 07/01/2020   Mild uncontrolled, goal a1c < 7 but pt states recent a1c at [facility] was 5.9, pt to continue current medical treatment metformin   Essential hypertension BP Readings from Last 3 Encounters:  01/01/21 120/74  08/12/20 118/74  07/01/20 110/70   Stable, pt to continue medical treatment benicar 20   Hyperlipidemia Lab Results  Component Value Date   LDLCALC 55 07/01/2020   Stable, pt to continue current statin lipitor 20   Vitamin B 12 deficiency Lab Results  Component Value Date   VITAMINB12 302 07/01/2020   Stable, cont oral replacement - b12 1000 mcg qd   Post-traumatic stress disorder, unspecified Stable overall, cont [facility]  Micturition syncope With recent episode x 1 about 2 wks ago, exam benign, no GU or CV symptoms current or since, will check UA with next labs and continue to monitor, declines ecg or lab eval today  Followup: Return in about 6 months (around 07/03/2021).  Cathlean Cower, MD 01/04/2021 3:05 PM [facility]

## 2021-01-02 ENCOUNTER — Other Ambulatory Visit: Payer: Medicare Other

## 2021-01-03 ENCOUNTER — Other Ambulatory Visit: Payer: Self-pay | Admitting: Internal Medicine

## 2021-01-03 NOTE — Telephone Encounter (Signed)
Please refill as per office routine med refill policy (all routine meds refilled for 3 mo or monthly per pt preference up to one year from last visit, then month to month grace period for 3 mo, then further med refills will have to be denied)  

## 2021-01-04 ENCOUNTER — Encounter: Payer: Self-pay | Admitting: Internal Medicine

## 2021-01-04 DIAGNOSIS — R55 Syncope and collapse: Secondary | ICD-10-CM | POA: Insufficient documentation

## 2021-01-04 NOTE — Assessment & Plan Note (Signed)
Stable overall, cont counseling and f/u per [name]

## 2021-01-04 NOTE — Assessment & Plan Note (Signed)
Lab Results  Component Value Date   HGBA1C 7.2 (H) 07/01/2020   Mild uncontrolled, goal a1c < 7 but pt states recent a1c at [facility] was 5.9, pt to continue current medical treatment metformin

## 2021-01-04 NOTE — Assessment & Plan Note (Addendum)
With recent episode x 1 about 2 wks ago, exam benign, no GU or CV symptoms current or since, will check UA with next labs and continue to monitor, declines ecg or lab eval today

## 2021-01-04 NOTE — Assessment & Plan Note (Signed)
Lab Results  Component Value Date   VITAMINB12 302 07/01/2020   Stable, cont oral replacement - b12 1000 mcg qd

## 2021-01-04 NOTE — Assessment & Plan Note (Signed)
BP Readings from Last 3 Encounters:  01/01/21 120/74  08/12/20 118/74  07/01/20 110/70   Stable, pt to continue medical treatment benicar 20

## 2021-01-04 NOTE — Assessment & Plan Note (Signed)
Lab Results  Component Value Date   LDLCALC 55 07/01/2020   Stable, pt to continue current statin lipitor 20

## 2021-01-06 ENCOUNTER — Ambulatory Visit: Payer: Medicare Other | Admitting: Podiatry

## 2021-01-06 ENCOUNTER — Other Ambulatory Visit: Payer: Self-pay

## 2021-01-06 ENCOUNTER — Encounter: Payer: Self-pay | Admitting: Endocrinology

## 2021-01-06 ENCOUNTER — Ambulatory Visit (INDEPENDENT_AMBULATORY_CARE_PROVIDER_SITE_OTHER): Payer: Medicare Other | Admitting: Endocrinology

## 2021-01-06 VITALS — BP 112/72 | HR 84 | Ht 73.5 in | Wt 330.2 lb

## 2021-01-06 DIAGNOSIS — E291 Testicular hypofunction: Secondary | ICD-10-CM

## 2021-01-06 MED ORDER — TESTOSTERONE 4 MG/24HR TD PT24: 1.0000 | MEDICATED_PATCH | Freq: Every day | TRANSDERMAL | 2 refills | Status: AC

## 2021-01-06 NOTE — Progress Notes (Signed)
Patient ID: [name], male   DOB: 1965-08-11, 55 y.o.   MRN: [id number]            Chief complaint: Follow-up of low testosterone  History of Present Illness  Hypogonadism was diagnosed in 2012  He has had complaints of fatigue, decreased motivation, decreased libido, and general feeling bad The symptoms started a few years ago and somewhat worse for couple of months before his visit in 7/19 No obvious causes of low testosterone were identified on his initial consultation In 2012 his free testosterone level was low normal at 47 with total 180  Repeat free testosterone was 8.0 done on 01/16/2018 in the morning at 9:30 AM  RECENT history:  Treatment regimen: taking 2 pumps on each arm of the AndroGel  With using clomiphene he did not have adequate response of the testosterone level and also LH had gone above normal  He has been on AndroGel 1.62% since 01/2018  His testosterone level was below 300 in December 2021 and he apparently had not missed any doses of his testosterone before his labs were drawn For this reason his dose was increased to 5 pumps daily He is applying 3 pumps on one side and 2 on the other after his shower daily  He is complaining of decreased motivation at times but not unusual fatigue or weakness Also has decreased libido  His testosterone level which was back up over 400  is now only 216 He thinks he has not missed any doses Gaynelle Cage is not covered by his insurance  Hemoglobin has been consistently normal   Lab Results  Component Value Date   TESTOSTERONE 216.12 (L) 01/01/2021   TESTOSTERONE 438.82 08/06/2020   TESTOSTERONE 287.22 (L) 06/27/2020   TESTOSTERONE 421.57 12/20/2019    Baseline prolactin level: 8.3  Baseline LH of 7.4  Lab Results  Component Value Date   LH 13.04 (H) 03/29/2018   Lab Results  Component Value Date   HGB 13.0 01/01/2021   DIABETES: He has had type 2 diabetes treated by his PCP with metformin  Diagnosis  initially was in 2014 with an A1c of 10.9  Previously did not have any benefit from going to the weight management program  Currently on Metformin   He said that the A1c was 5.7 done at the [name]  He apparently has done well with significant weight loss over the last few months   Wt Readings from Last 3 Encounters:  01/06/21 (!) 330 lb 3.2 oz (149.8 kg)  01/01/21 (!) 326 lb 6.4 oz (148.1 kg)  08/12/20 (!) 347 lb 6.4 oz (157.6 kg)    Lab Results  Component Value Date   HGBA1C 7.2 (H) 07/01/2020   HGBA1C 8.1 (H) 11/16/2019   HGBA1C 6.3 (H) 07/16/2019   Lab Results  Component Value Date   MICROALBUR <0.7 11/16/2019   LDLCALC 55 07/01/2020   CREATININE 1.07 07/01/2020        Allergies as of 01/06/2021   No Known Allergies      Medication List        Accurate as of January 06, 2021 10:48 AM. If you have any questions, ask your nurse or doctor.          STOP taking these medications    phentermine 37.5 MG capsule Stopped by: Elayne Snare, MD   Testosterone 20.25 MG/ACT (1.62%) Gel Stopped by: Elayne Snare, MD       TAKE these medications    atorvastatin 20 MG tablet Commonly  known as: LIPITOR Take 20 mg by mouth daily.   Benzoyl Peroxide 10 % Liqd WASH AS DIRECTED AFFECTED AREA DAILY  TO THE FACE AND THE BACK OF THE SCALP DAILY   buPROPion 150 MG 12 hr tablet Commonly known as: WELLBUTRIN SR Take 150 mg by mouth daily.   busPIRone 10 MG tablet Commonly known as: BUSPAR Take 10 mg by mouth 3 (three) times daily.   busPIRone 15 MG tablet Commonly known as: BUSPAR Take 15 mg by mouth at bedtime.   cetirizine 10 MG tablet Commonly known as: ZYRTEC TAKE ONE TABLET BY MOUTH DAILY FOR ALLERGY   clindamycin 1 % external solution Commonly known as: CLEOCIN T APPLY SMALL AMOUNT TO AFFECTED AREA DAILY  TO THE [place] REGION  AS NEEDED   clobetasol 0.05 % external solution Commonly known as: TEMOVATE   cromolyn 4 % ophthalmic solution Commonly known as:  OPTICROM INSTILL 1 DROP INTO EACH EYE 4 TIMES DAILY AT REGULAR INTERVALS.   cyclobenzaprine 5 MG tablet Commonly known as: FLEXERIL Take 1 tablet (5 mg total) by mouth 3 (three) times daily as needed for muscle spasms.   diclofenac sodium 1 % Gel Commonly known as: Voltaren Apply 4 g topically 4 (four) times daily as needed.   dicyclomine 10 MG capsule Commonly known as: BENTYL   DULoxetine 30 MG capsule Commonly known as: CYMBALTA Take 30 mg by mouth daily.   DULoxetine 20 MG capsule Commonly known as: CYMBALTA Take 2 capsules by mouth 2 (two) times daily.   escitalopram 20 MG tablet Commonly known as: Lexapro Take 1 tablet (20 mg total) by mouth daily.   fluticasone 50 MCG/ACT nasal spray Commonly known as: FLONASE SPRAY 2 SPRAYS INTO EACH NOSTRIL EVERY DAY   fluticasone 50 MCG/ACT nasal spray Commonly known as: FLONASE Place into the nose.   gabapentin 300 MG capsule Commonly known as: NEURONTIN Take one AM, one PM, two QHS   HYDROcodone-acetaminophen 5-325 MG tablet Commonly known as: NORCO/VICODIN Take 1 tablet by mouth every 6 (six) hours as needed for moderate pain.   hydrocortisone 2.5 % cream APPLY SMALL AMOUNT TO AFFECTED AREA TWICE A DAY  TO THE [place] REGION AND THE NECK  AS NEEDED FOR FLARES   hyoscyamine 0.125 MG SL tablet Commonly known as: LEVSIN SL Place 1 tablet (0.125 mg total) under the tongue every 4 (four) hours as needed for up to 10 days.   indomethacin 50 MG capsule Commonly known as: INDOCIN TAKE ONE CAPSULE EVERY 6 HOURS AS NEEDED GOUT ONLY   lamoTRIgine 25 MG tablet Commonly known as: LAMICTAL Take 2 tablets by mouth daily.   loperamide 2 MG capsule Commonly known as: IMODIUM   meloxicam 7.5 MG tablet Commonly known as: MOBIC Take 7.5 mg by mouth daily. What changed: Another medication with the same name was removed. Continue taking this medication, and follow the directions you see here. Changed by: Elayne Snare, MD    meloxicam 15 MG tablet Commonly known as: Mobic 1 po q d prn What changed: Another medication with the same name was removed. Continue taking this medication, and follow the directions you see here. Changed by: Elayne Snare, MD   metFORMIN 500 MG tablet Commonly known as: GLUCOPHAGE Take 1 tablet by mouth 2 (two) times daily.   metFORMIN 500 MG tablet Commonly known as: GLUCOPHAGE TAKE 2 TABLETS BY MOUTH TWICE A DAY   metoprolol tartrate 25 MG tablet Commonly known as: LOPRESSOR   olmesartan 20 MG tablet Commonly  known as: BENICAR Take 1 tablet by mouth daily.   olmesartan 20 MG tablet Commonly known as: BENICAR TAKE 1 TABLET (20 MG TOTAL) BY MOUTH DAILY. ANNUAL APPT DUE IN SEPT MUST SEE PROVIDER FOR FUTURE REFILLS   omeprazole 20 MG capsule Commonly known as: PRILOSEC Take 1 capsule by mouth daily.   omeprazole 40 MG capsule Commonly known as: PRILOSEC TAKE 1 CAPSULE BY MOUTH EVERY DAY   ondansetron 4 MG tablet Commonly known as: Zofran Take 1 tablet (4 mg total) by mouth every 8 (eight) hours as needed for nausea or vomiting.   oxyCODONE-acetaminophen 10-325 MG tablet Commonly known as: PERCOCET Take 1 tablet by mouth every 8 (eight) hours as needed for pain.   prazosin 2 MG capsule Commonly known as: Minipress Take 1 capsule (2 mg total) by mouth at bedtime.   QUEtiapine 25 MG tablet Commonly known as: SEROQUEL Take 25 mg by mouth at bedtime.   sildenafil 100 MG tablet Commonly known as: VIAGRA Take by mouth.   sildenafil 100 MG tablet Commonly known as: VIAGRA Take by mouth.   tadalafil 20 MG tablet Commonly known as: CIALIS Take 0.5-1 tablets (10-20 mg total) by mouth every other day as needed for erectile dysfunction.   traZODone 50 MG tablet Commonly known as: DESYREL Take 1 tablet (50 mg total) by mouth at bedtime.   vardenafil 20 MG tablet Commonly known as: Levitra Take 1 tablet (20 mg total) by mouth daily as needed for erectile  dysfunction.   Vitamin D (Ergocalciferol) 1.25 MG (50000 UNIT) Caps capsule Commonly known as: DRISDOL Take 1 capsule (50,000 Units total) by mouth every 7 (seven) days.   Xiidra 5 % Soln Generic drug: [employer] INSTILL 1 DROP INTO BOTH EYES TWICE A DAY   Zoster Vaccine Adjuvanted injection Commonly known as: SHINGRIX Inject into the muscle.        Allergies: No Known Allergies  Past Medical History:  Diagnosis Date   Acid reflux    Alcohol abuse    Anxiety    Arthritis    Depression 07/08/2015   Diabetes mellitus ([place])    Diabetes mellitus, type II (HCC)    DJD (degenerative joint disease)    Gallbladder problem    Gallstones    GERD (gastroesophageal reflux disease)    Gout    HTN (hypertension)    Hyperglycemia    Hyperlipidemia    IBS (irritable bowel syndrome)    Joint pain    Low testosterone in male    Male hypogonadism 07/08/2015   Obesity    Osteoarthritis    PONV (postoperative nausea and vomiting)    Sleep apnea     Past Surgical History:  Procedure Laterality Date   Back Cyst Excision     x2   CHOLECYSTECTOMY  08/11/2011   Procedure: LAPAROSCOPIC CHOLECYSTECTOMY WITH INTRAOPERATIVE CHOLANGIOGRAM;  Surgeon: Judieth Keens, DO;  Location: [facility] OR;  Service: General;  Laterality: N/A;  Laparoscopic cholecystectomy with cholangiogram.   COLONOSCOPY WITH PROPOFOL N/A 09/15/2015   Procedure: COLONOSCOPY WITH PROPOFOL;  Surgeon: Gatha Mayer, MD;  Location: [facility];  Service: Endoscopy;  Laterality: N/A;   JOINT REPLACEMENT  01/26/2011   right knee   KNEE ARTHROSCOPY     x 2 left   KNEE ARTHROSCOPY     x 2 right   REPAIR KNEE LIGAMENT     Left   RIGHT FOOT     BONES STRAIGHTENED OUT   2006   Shoulder Surgery, Reconstruction of  Joint     Left x 3, Last 02/2007   Tennis elbow surgery, Nerve entrapment     x2 right    Family History  Problem Relation Age of Onset   Sarcoidosis Mother    High Cholesterol Mother    Obesity Mother     Colon cancer Brother 63       half-brother   Ovarian cancer Maternal Grandmother    Cancer Neg Hx    Heart disease Neg Hx    Kidney disease Neg Hx     Social History:  reports that he has never smoked. He has never used smokeless tobacco. He reports that he does not drink alcohol and does not use drugs.  Review of Systems   Hypertension, treated with Benicar and Minipress, followed by PCP  BP Readings from Last 3 Encounters:  01/06/21 112/72  01/01/21 120/74  08/12/20 118/74   Weight history:  Wt Readings from Last 3 Encounters:  01/06/21 (!) 330 lb 3.2 oz (149.8 kg)  01/01/21 (!) 326 lb 6.4 oz (148.1 kg)  08/12/20 (!) 347 lb 6.4 oz (157.6 kg)    General Examination:   BP 112/72   Pulse 84   Ht 6' 1.5" (1.867 m)   Wt (!) 330 lb 3.2 oz (149.8 kg)   SpO2 95%   BMI 42.97 kg/m      Assessment/ Plan:  Hypogonadism, diagnosed around 2016  He had symptomatic hypogonadism  He has been treated with AndroGel 1.62%, total 5 pumps daily   Even though he has been reportedly regular with his application of AndroGel and had a normal level on the last visit his testosterone level is back down to nearly 200 He is generally doing fairly well but is concerned about decreased motivation and libido  Currently with Medicare he is not able to get brand-name medications affordably and likely cannot get this from the [name] also We will try Androderm, discussed that this may cause skin irritation Also he can check to see if his insurance will cover Testopel  Elayne Snare 01/06/2021, 10:48 AM     Note: This office note was prepared with Dragon voice recognition system technology. Any transcriptional errors that result from this process are unintentional.

## 2021-01-06 NOTE — Patient Instructions (Signed)
Testopel pellets, Oral is [employer]

## 2021-01-13 ENCOUNTER — Ambulatory Visit (INDEPENDENT_AMBULATORY_CARE_PROVIDER_SITE_OTHER): Payer: Medicare PPO | Admitting: Podiatry

## 2021-01-13 ENCOUNTER — Other Ambulatory Visit: Payer: Self-pay | Admitting: Podiatry

## 2021-01-13 ENCOUNTER — Other Ambulatory Visit: Payer: Self-pay

## 2021-01-13 ENCOUNTER — Encounter: Payer: Self-pay | Admitting: Podiatry

## 2021-01-13 ENCOUNTER — Ambulatory Visit (INDEPENDENT_AMBULATORY_CARE_PROVIDER_SITE_OTHER): Payer: Medicare PPO

## 2021-01-13 DIAGNOSIS — M778 Other enthesopathies, not elsewhere classified: Secondary | ICD-10-CM

## 2021-01-13 DIAGNOSIS — M7662 Achilles tendinitis, left leg: Secondary | ICD-10-CM

## 2021-01-14 NOTE — Progress Notes (Signed)
He presents today for follow-up of his first metatarsal phalangeal joint implant left.  States that the joint is a bit stiff but I would like to follow-up with you occasionally just to make sure that it is doing well.  States that he is at least able to be active and wear appropriate shoe gear at this point now.  States that this surgery is the best thing that he is ever done.  Discussed this today that his testosterone levels are low and is now seeing an endocrinologist to assist him with maintaining those levels  Objective: Vital signs are stable he is alert and oriented x3.  Pulses are palpable.  Neurologic sensorium is intact Deetjen reflexes are intact muscle strength is normal symmetrical.  He has a very stiff range of motion on dorsiflexion however plantarflexion is full.  I believe the dorsiflexion is tight due to scar tissue surrounding the anterior portion of the sesamoid bones.  Radiographs taken today demonstrate some bony regrowth around a Keller arthroplasty with a single silicone implant and some osteoarthritic changes around the sesamoids but other than that no acute findings around the joint.  There still appears to be some significant soft tissue swelling around the joint most likely consistent with scar tissue.  Assessment: Well-healing Keller arthroplasty.  Plan: Discussed etiology pathology conservative versus surgical therapies I encouraged him to continue his exercise program and to continue to ambulate is much as possible.  I will follow-up with him on an as needed basis.

## 2021-02-10 ENCOUNTER — Ambulatory Visit: Payer: BC Managed Care – PPO | Admitting: Podiatry

## 2021-02-20 ENCOUNTER — Telehealth: Payer: Self-pay | Admitting: Endocrinology

## 2021-02-20 NOTE — Telephone Encounter (Signed)
Patient called to let Dt. Dwyane Dee know that Patient went to the [name] and his testosterone levels are: 483.80 and Patient does not feel it is necessary to keep the 03/10/21 appointment and does not want to reschedule appointment at this time.  Patient states his A1C is 5.8.  Patient requests to be called at ph# [phone] if Dr. Dwyane Dee feels it is necessary for Patient to schedule further follow up appointments.

## 2021-02-20 NOTE — Telephone Encounter (Signed)
Called and advised pt his upcoming appt can be rescheduled for 4 months and his labs need to be sent to the office. Pt states there has not been any change to his regimen. Pt currently taking 3 pumps on left arm and 2 pumps on right arm daily of testosterone gel.

## 2021-02-20 NOTE — Telephone Encounter (Signed)
Please advise 

## 2021-03-03 ENCOUNTER — Encounter: Payer: Self-pay | Admitting: Internal Medicine

## 2021-03-06 ENCOUNTER — Other Ambulatory Visit: Payer: Medicare Other

## 2021-03-10 ENCOUNTER — Ambulatory Visit: Payer: Medicare Other | Admitting: Endocrinology

## 2021-03-31 ENCOUNTER — Encounter: Payer: Self-pay | Admitting: Internal Medicine

## 2021-04-29 ENCOUNTER — Other Ambulatory Visit: Payer: Self-pay

## 2021-04-29 ENCOUNTER — Ambulatory Visit (AMBULATORY_SURGERY_CENTER): Payer: Self-pay

## 2021-04-29 ENCOUNTER — Encounter: Payer: Self-pay | Admitting: Internal Medicine

## 2021-04-29 VITALS — Ht 73.5 in | Wt 352.0 lb

## 2021-04-29 DIAGNOSIS — Z800 Family history of malignant neoplasm of digestive organs: Secondary | ICD-10-CM

## 2021-04-29 DIAGNOSIS — Z86010 Personal history of colonic polyps: Secondary | ICD-10-CM

## 2021-04-29 NOTE — Progress Notes (Signed)
No allergies to soy or egg Pt is not on blood thinners or diet pills Denies issues with sedation/intubation Denies atrial flutter/fib Denies constipation   Emmi instructions given to pt  Pt is aware of Covid safety and care partner requirements.    Pt asked if he would be at the hospital this time.  After investigating and speaking with Charge nurse, at this time the policy was that if weight was over 350 they went to [facility] vs BMI which is now followed.  Pt's wt is 352, BMI 45.81 today

## 2021-05-03 DIAGNOSIS — Z7984 Long term (current) use of oral hypoglycemic drugs: Secondary | ICD-10-CM | POA: Diagnosis not present

## 2021-05-03 DIAGNOSIS — Z79899 Other long term (current) drug therapy: Secondary | ICD-10-CM | POA: Diagnosis not present

## 2021-05-03 DIAGNOSIS — K219 Gastro-esophageal reflux disease without esophagitis: Secondary | ICD-10-CM | POA: Diagnosis not present

## 2021-05-03 DIAGNOSIS — I10 Essential (primary) hypertension: Secondary | ICD-10-CM | POA: Diagnosis not present

## 2021-05-03 DIAGNOSIS — E119 Type 2 diabetes mellitus without complications: Secondary | ICD-10-CM | POA: Diagnosis not present

## 2021-05-03 DIAGNOSIS — J069 Acute upper respiratory infection, unspecified: Secondary | ICD-10-CM | POA: Diagnosis not present

## 2021-05-03 DIAGNOSIS — B338 Other specified viral diseases: Secondary | ICD-10-CM | POA: Diagnosis not present

## 2021-05-03 DIAGNOSIS — B974 Respiratory syncytial virus as the cause of diseases classified elsewhere: Secondary | ICD-10-CM | POA: Diagnosis not present

## 2021-05-03 DIAGNOSIS — Z20822 Contact with and (suspected) exposure to covid-19: Secondary | ICD-10-CM | POA: Diagnosis not present

## 2021-05-21 ENCOUNTER — Encounter: Payer: Medicare PPO | Admitting: Internal Medicine

## 2021-07-02 ENCOUNTER — Ambulatory Visit (AMBULATORY_SURGERY_CENTER): Payer: Medicare PPO | Admitting: Internal Medicine

## 2021-07-02 ENCOUNTER — Other Ambulatory Visit: Payer: Self-pay

## 2021-07-02 ENCOUNTER — Encounter: Payer: Self-pay | Admitting: Internal Medicine

## 2021-07-02 VITALS — BP 120/74 | HR 74 | Temp 98.4°F | Resp 16 | Ht 73.0 in | Wt 352.0 lb

## 2021-07-02 DIAGNOSIS — D120 Benign neoplasm of cecum: Secondary | ICD-10-CM

## 2021-07-02 DIAGNOSIS — Z800 Family history of malignant neoplasm of digestive organs: Secondary | ICD-10-CM | POA: Diagnosis not present

## 2021-07-02 DIAGNOSIS — Z1211 Encounter for screening for malignant neoplasm of colon: Secondary | ICD-10-CM | POA: Diagnosis not present

## 2021-07-02 MED ORDER — SODIUM CHLORIDE 0.9 % IV SOLN: 500.0000 mL | Freq: Once | INTRAVENOUS | Status: DC

## 2021-07-02 NOTE — Op Note (Signed)
[facility] Patient Name: [name] Procedure Date: 07/02/2021 11:34 AM MRN: [id number] Endoscopist: Gatha Mayer , MD Age: 55 Referring MD:  Date of Birth: 1965-11-09 Gender: Male Account #: [id number] Procedure:                Colonoscopy Indications:              Screening in patient at increased risk: Colorectal                            cancer in brother before age 102 Medicines:                Propofol per Anesthesia, Monitored Anesthesia Care Procedure:                Pre-Anesthesia Assessment:                           - Prior to the procedure, a History and Physical                            was performed, and patient medications and                            allergies were reviewed. The patient's tolerance of                            previous anesthesia was also reviewed. The risks                            and benefits of the procedure and the sedation                            options and risks were discussed with the patient.                            All questions were answered, and informed consent                            was obtained. Prior Anticoagulants: The patient has                            taken no previous anticoagulant or antiplatelet                            agents. ASA Grade Assessment: III - A patient with                            severe systemic disease. After reviewing the risks                            and benefits, the patient was deemed in                            satisfactory condition to undergo the procedure.  After obtaining informed consent, the colonoscope                            was passed under direct vision. Throughout the                            procedure, the patient's blood pressure, pulse, and                            oxygen saturations were monitored continuously. The                            Olympus CF-HQ190L [id number]) Colonoscope was                             introduced through the anus and advanced to the the                            cecum, identified by appendiceal orifice and                            ileocecal valve. The colonoscopy was performed                            without difficulty. The patient tolerated the                            procedure well. The quality of the bowel                            preparation was good. The ileocecal valve,                            appendiceal orifice, and rectum were photographed. Scope In: 11:38:35 AM Scope Out: 11:54:11 AM Scope Withdrawal Time: 0 hours 13 minutes 19 seconds  Total Procedure Duration: 0 hours 15 minutes 36 seconds  Findings:                 The perianal and digital rectal examinations were                            normal.                           A diminutive polyp was found in the cecum. The                            polyp was sessile. The polyp was removed with a                            cold snare. Resection and retrieval were complete.                            Verification of patient identification for the  specimen was done. Estimated blood loss was minimal.                           The exam was otherwise without abnormality on                            direct and retroflexion views. Complications:            No immediate complications. Estimated Blood Loss:     Estimated blood loss was minimal. Impression:               - One diminutive polyp in the cecum, removed with a                            cold snare. Resected and retrieved.                           - The examination was otherwise normal on direct                            and retroflexion views. Family Hx CRCA half-brother Recommendation:           - Patient has a contact number available for                            emergencies. The signs and symptoms of potential                            delayed complications were discussed with the                             patient. Return to normal activities tomorrow.                            Written discharge instructions were provided to the                            patient.                           - Resume previous diet.                           - Continue present medications.                           - Repeat colonoscopy in 5 years. Gatha Mayer, MD 07/02/2021 11:59:36 AM This report has been signed electronically.

## 2021-07-02 NOTE — Patient Instructions (Addendum)
I found and removed one tiny polyp that looks benign. I will let you know pathology results.  Repeat in 5 years.  I appreciate the opportunity to care for you. Gatha Mayer, MD, [facility]  Handout provided on polyps.  YOU HAD AN ENDOSCOPIC PROCEDURE TODAY AT [facility]:   Refer to the procedure report that was given to you for any specific questions about what was found during the examination.  If the procedure report does not answer your questions, please call your gastroenterologist to clarify.  If you requested that your care partner not be given the details of your procedure findings, then the procedure report has been included in a sealed envelope for you to review at your convenience later.  YOU SHOULD EXPECT: Some feelings of bloating in the abdomen. Passage of more gas than usual.  Walking can help get rid of the air that was put into your GI tract during the procedure and reduce the bloating. If you had a lower endoscopy (such as a colonoscopy or flexible sigmoidoscopy) you may notice spotting of blood in your stool or on the toilet paper. If you underwent a bowel prep for your procedure, you may not have a normal bowel movement for a few days.  Please Note:  You might notice some irritation and congestion in your nose or some drainage.  This is from the oxygen used during your procedure.  There is no need for concern and it should clear up in a day or so.  SYMPTOMS TO REPORT IMMEDIATELY:  Following lower endoscopy (colonoscopy or flexible sigmoidoscopy):  Excessive amounts of blood in the stool  Significant tenderness or worsening of abdominal pains  Swelling of the abdomen that is new, acute  Fever of 100F or higher  For urgent or emergent issues, a gastroenterologist can be reached at any hour by calling [phone]. Do not use MyChart messaging for urgent concerns.    DIET:  We do recommend a small meal at first, but then you may proceed to your regular  diet.  Drink plenty of fluids but you should avoid alcoholic beverages for 24 hours.  ACTIVITY:  You should plan to take it easy for the rest of today and you should NOT DRIVE or use heavy machinery until tomorrow (because of the sedation medicines used during the test).    FOLLOW UP: Our staff will call the number listed on your records 48-72 hours following your procedure to check on you and address any questions or concerns that you may have regarding the information given to you following your procedure. If we do not reach you, we will leave a message.  We will attempt to reach you two times.  During this call, we will ask if you have developed any symptoms of COVID 19. If you develop any symptoms (ie: fever, flu-like symptoms, shortness of breath, cough etc.) before then, please call [phone].  If you test positive for Covid 19 in the 2 weeks post procedure, please call and report this information to [place].    If any biopsies were taken you will be contacted by phone or by letter within the next 1-3 weeks.  Please call us at [phone] if you have not heard about the biopsies in 3 weeks.    SIGNATURES/CONFIDENTIALITY: You and/or your care partner have signed paperwork which will be entered into your electronic medical record.  These signatures attest to the fact that that the information above on your After Visit Summary  has been reviewed and is understood.  Full responsibility of the confidentiality of this discharge information lies with you and/or your care-partner.

## 2021-07-02 NOTE — Progress Notes (Signed)
Called to room to assist during endoscopic procedure.  Patient ID and intended procedure confirmed with present staff. Received instructions for my participation in the procedure from the performing physician.  

## 2021-07-02 NOTE — Progress Notes (Signed)
[facility] History and Physical   Primary Care Physician:  Biagio Borg, MD   Reason for Procedure:   Family hx colon cancer  Plan:    colonoscopy     HPI: [name] is a 55 y.o. male here for screening colonoscopy - has a half-brother w/ colon cancer   Past Medical History:  Diagnosis Date   Acid reflux    Alcohol abuse    Anxiety    Arthritis    Depression 07/08/2015   Diabetes mellitus ([place])    Diabetes mellitus, type II ([place])    DJD (degenerative joint disease)    Gallbladder problem    Gallstones    GERD (gastroesophageal reflux disease)    Gout    HTN (hypertension)    Hyperglycemia    Hyperlipidemia    IBS (irritable bowel syndrome)    Joint pain    Low testosterone in male    Male hypogonadism 07/08/2015   Obesity    Osteoarthritis    PONV (postoperative nausea and vomiting)    Sleep apnea     Past Surgical History:  Procedure Laterality Date   Back Cyst Excision     x2   CHOLECYSTECTOMY  08/11/2011   Procedure: LAPAROSCOPIC CHOLECYSTECTOMY WITH INTRAOPERATIVE CHOLANGIOGRAM;  Surgeon: Judieth Keens, DO;  Location: [facility] OR;  Service: General;  Laterality: N/A;  Laparoscopic cholecystectomy with cholangiogram.   COLONOSCOPY WITH PROPOFOL N/A 09/15/2015   Procedure: COLONOSCOPY WITH PROPOFOL;  Surgeon: Gatha Mayer, MD;  Location: [facility];  Service: Endoscopy;  Laterality: N/A;   JOINT REPLACEMENT  01/26/2011   right knee   KNEE ARTHROSCOPY     x 2 left   KNEE ARTHROSCOPY     x 2 right   REPAIR KNEE LIGAMENT     Left   RIGHT FOOT     BONES STRAIGHTENED OUT   2006   Shoulder Surgery, Reconstruction of Joint     Left x 3, Last 02/2007   Tennis elbow surgery, Nerve entrapment     x2 right    Prior to Admission medications   Medication Sig Start Date End Date Taking? Authorizing Provider  atorvastatin (LIPITOR) 20 MG tablet Take 20 mg by mouth daily. 07/31/19  Yes [provider]  buPROPion (WELLBUTRIN SR) 150 MG 12  hr tablet Take 150 mg by mouth daily.   Yes [provider]  busPIRone (BUSPAR) 10 MG tablet Take 10 mg by mouth 3 (three) times daily.   Yes [provider]  busPIRone (BUSPAR) 15 MG tablet Take 15 mg by mouth at bedtime.   Yes [provider]  cetirizine (ZYRTEC) 10 MG tablet TAKE ONE TABLET BY MOUTH DAILY FOR ALLERGY 10/24/20 10/25/21 Yes [provider]  clindamycin (CLEOCIN T) 1 % external solution APPLY SMALL AMOUNT TO AFFECTED AREA DAILY  TO THE BEARD REGION  AS NEEDED 08/07/20 08/08/21 Yes [provider]  clobetasol (TEMOVATE) 0.05 % external solution  08/08/20  Yes [provider]  cromolyn (OPTICROM) 4 % ophthalmic solution INSTILL 1 DROP INTO EACH EYE 4 TIMES DAILY AT REGULAR INTERVALS. 11/09/18  Yes [provider]  cyclobenzaprine (FLEXERIL) 5 MG tablet Take 1 tablet (5 mg total) by mouth 3 (three) times daily as needed for muscle spasms. 11/16/19  Yes Biagio Borg, MD  diclofenac sodium (VOLTAREN) 1 % GEL Apply 4 g topically 4 (four) times daily as needed. 12/05/18  Yes Biagio Borg, MD  dicyclomine (BENTYL) 10 MG capsule  10/31/18  Yes [provider]  DULoxetine (CYMBALTA) 20 MG capsule Take 2 capsules by mouth 2 (two) times daily. 08/12/20 08/13/21 Yes [provider]  DULoxetine (CYMBALTA) 30 MG capsule Take 30 mg by mouth daily.   Yes [provider]  fluticasone (FLONASE) 50 MCG/ACT nasal spray SPRAY 2 SPRAYS INTO EACH NOSTRIL EVERY DAY 08/16/19  Yes Biagio Borg, MD  gabapentin (NEURONTIN) 300 MG capsule Take one AM, one PM, two QHS 09/07/19  Yes Hyatt, Max T, DPM  hydrocortisone 2.5 % cream APPLY SMALL AMOUNT TO AFFECTED AREA TWICE A DAY  TO THE [place] REGION AND THE NECK  AS NEEDED FOR FLARES 08/07/20 08/08/21 Yes [provider]  indomethacin (INDOCIN) 50 MG capsule TAKE ONE CAPSULE EVERY 6 HOURS AS NEEDED GOUT ONLY 10/13/20  Yes Biagio Borg, MD  lamoTRIgine (LAMICTAL) 25 MG tablet Take 2  tablets by mouth daily. 12/23/20 08/13/21 Yes [provider]  losartan (COZAAR) 50 MG tablet TAKE ONE-HALF TABLET BY MOUTH DAILY FOR BLOOD PRESSURE (REPLACES OLMESARTAN) 02/12/21  Yes [provider]  metFORMIN (GLUCOPHAGE) 500 MG tablet TAKE 2 TABLETS BY MOUTH TWICE A DAY 08/15/20  Yes Biagio Borg, MD  metoprolol tartrate (LOPRESSOR) 25 MG tablet  08/19/16  Yes [provider]  olmesartan (BENICAR) 20 MG tablet TAKE 1 TABLET (20 MG TOTAL) BY MOUTH DAILY. ANNUAL APPT DUE IN SEPT MUST SEE PROVIDER FOR FUTURE REFILLS 08/15/20  Yes Biagio Borg, MD  omeprazole (PRILOSEC) 40 MG capsule TAKE 1 CAPSULE BY MOUTH EVERY DAY 01/05/21  Yes Biagio Borg, MD  prazosin (MINIPRESS) 2 MG capsule Take 1 capsule (2 mg total) by mouth at bedtime. 09/25/15  Yes Dara Hoyer, PA-C  QUEtiapine (SEROQUEL) 25 MG tablet Take 25 mg by mouth at bedtime.   Yes [provider]  sildenafil (VIAGRA) 100 MG tablet Take by mouth. 01/23/20  Yes [provider]  testosterone (ANDRODERM) 4 MG/24HR PT24 patch Place 1 patch onto the skin daily. 01/06/21  Yes Elayne Snare, MD  traZODone (DESYREL) 50 MG tablet Take 1 tablet (50 mg total) by mouth at bedtime. 09/25/15  Yes Dara Hoyer, PA-C  Vitamin D, Ergocalciferol, (DRISDOL) 1.25 MG (50000 UNIT) CAPS capsule Take 1 capsule (50,000 Units total) by mouth every 7 (seven) days. 11/18/19  Yes Biagio Borg, MD  XIIDRA 5 % SOLN INSTILL 1 DROP INTO BOTH EYES TWICE A DAY 08/19/18  Yes [provider]  Benzoyl Peroxide 10 % LIQD Secretary AS DIRECTED AFFECTED AREA DAILY  TO THE FACE AND THE BACK OF THE SCALP DAILY 08/07/20 08/08/21  [provider]  escitalopram (LEXAPRO) 20 MG tablet Take 1 tablet (20 mg total) by mouth daily. 09/25/15 04/29/21  Dara Hoyer, PA-C  HYDROcodone-acetaminophen (NORCO/VICODIN) 5-325 MG tablet Take 1 tablet by mouth every 6 (six) hours as needed for moderate pain. 03/30/18   Biagio Borg, MD  loperamide (IMODIUM) 2  MG capsule  02/15/19   [provider]  meloxicam (MOBIC) 7.5 MG tablet Take 7.5 mg by mouth daily.    [provider]  ondansetron (ZOFRAN) 4 MG tablet Take 1 tablet (4 mg total) by mouth every 8 (eight) hours as needed for nausea or vomiting. 07/03/18   Hyatt, Max T, DPM  oxyCODONE-acetaminophen (PERCOCET) 10-325 MG tablet Take 1 tablet by mouth every 8 (eight) hours as needed for pain. 07/18/18   Hyatt, Romilda Garret, DPM    Current Outpatient Medications  Medication Sig Dispense Refill  atorvastatin (LIPITOR) 20 MG tablet Take 20 mg by mouth daily.     buPROPion (WELLBUTRIN SR) 150 MG 12 hr tablet Take 150 mg by mouth daily.     busPIRone (BUSPAR) 10 MG tablet Take 10 mg by mouth 3 (three) times daily.     busPIRone (BUSPAR) 15 MG tablet Take 15 mg by mouth at bedtime.     cetirizine (ZYRTEC) 10 MG tablet TAKE ONE TABLET BY MOUTH DAILY FOR ALLERGY     clindamycin (CLEOCIN T) 1 % external solution APPLY SMALL AMOUNT TO AFFECTED AREA DAILY  TO THE [place] REGION  AS NEEDED     clobetasol (TEMOVATE) 0.05 % external solution      cromolyn (OPTICROM) 4 % ophthalmic solution INSTILL 1 DROP INTO EACH EYE 4 TIMES DAILY AT REGULAR INTERVALS.     cyclobenzaprine (FLEXERIL) 5 MG tablet Take 1 tablet (5 mg total) by mouth 3 (three) times daily as needed for muscle spasms. 40 tablet 5   diclofenac sodium (VOLTAREN) 1 % GEL Apply 4 g topically 4 (four) times daily as needed. 400 g 1   dicyclomine (BENTYL) 10 MG capsule      DULoxetine (CYMBALTA) 20 MG capsule Take 2 capsules by mouth 2 (two) times daily.     DULoxetine (CYMBALTA) 30 MG capsule Take 30 mg by mouth daily.     fluticasone (FLONASE) 50 MCG/ACT nasal spray SPRAY 2 SPRAYS INTO EACH NOSTRIL EVERY DAY 48 mL 2   gabapentin (NEURONTIN) 300 MG capsule Take one AM, one PM, two QHS 240 capsule 2   hydrocortisone 2.5 % cream APPLY SMALL AMOUNT TO AFFECTED AREA TWICE A DAY  TO THE [place] REGION AND THE NECK  AS NEEDED FOR FLARES     indomethacin  (INDOCIN) 50 MG capsule TAKE ONE CAPSULE EVERY 6 HOURS AS NEEDED GOUT ONLY 60 capsule 2   lamoTRIgine (LAMICTAL) 25 MG tablet Take 2 tablets by mouth daily.     losartan (COZAAR) 50 MG tablet TAKE ONE-HALF TABLET BY MOUTH DAILY FOR BLOOD PRESSURE (REPLACES OLMESARTAN)     metFORMIN (GLUCOPHAGE) 500 MG tablet TAKE 2 TABLETS BY MOUTH TWICE A DAY 360 tablet 1   metoprolol tartrate (LOPRESSOR) 25 MG tablet      olmesartan (BENICAR) 20 MG tablet TAKE 1 TABLET (20 MG TOTAL) BY MOUTH DAILY. ANNUAL APPT DUE IN SEPT MUST SEE PROVIDER FOR FUTURE REFILLS 90 tablet 1   omeprazole (PRILOSEC) 40 MG capsule TAKE 1 CAPSULE BY MOUTH EVERY DAY 90 capsule 2   prazosin (MINIPRESS) 2 MG capsule Take 1 capsule (2 mg total) by mouth at bedtime. 30 capsule 1   QUEtiapine (SEROQUEL) 25 MG tablet Take 25 mg by mouth at bedtime.     sildenafil (VIAGRA) 100 MG tablet Take by mouth.     testosterone (ANDRODERM) 4 MG/24HR PT24 patch Place 1 patch onto the skin daily. 30 patch 2   traZODone (DESYREL) 50 MG tablet Take 1 tablet (50 mg total) by mouth at bedtime. 30 tablet 1   Vitamin D, Ergocalciferol, (DRISDOL) 1.25 MG (50000 UNIT) CAPS capsule Take 1 capsule (50,000 Units total) by mouth every 7 (seven) days. 4 capsule 0   XIIDRA 5 % SOLN INSTILL 1 DROP INTO BOTH EYES TWICE A DAY     Benzoyl Peroxide 10 % LIQD WASH AS DIRECTED AFFECTED AREA DAILY  TO THE FACE AND THE BACK OF THE SCALP DAILY     escitalopram (LEXAPRO) 20 MG tablet Take 1 tablet (20 mg total) by  mouth daily. 30 tablet 2   HYDROcodone-acetaminophen (NORCO/VICODIN) 5-325 MG tablet Take 1 tablet by mouth every 6 (six) hours as needed for moderate pain. 40 tablet 0   loperamide (IMODIUM) 2 MG capsule      meloxicam (MOBIC) 7.5 MG tablet Take 7.5 mg by mouth daily.     ondansetron (ZOFRAN) 4 MG tablet Take 1 tablet (4 mg total) by mouth every 8 (eight) hours as needed for nausea or vomiting. 20 tablet 0   oxyCODONE-acetaminophen (PERCOCET) 10-325 MG tablet Take 1  tablet by mouth every 8 (eight) hours as needed for pain. 20 tablet 0   Current Facility-Administered Medications  Medication Dose Route Frequency Provider Last Rate Last Admin   0.9 %  sodium chloride infusion  500 mL Intravenous Once Gatha Mayer, MD        Allergies as of 07/02/2021   (No Known Allergies)    Family History  Problem Relation Age of Onset   Sarcoidosis Mother    High Cholesterol Mother    Obesity Mother    Colon cancer Brother 63       half-brother   Ovarian cancer Maternal Grandmother    Cancer Neg Hx    Heart disease Neg Hx    Kidney disease Neg Hx    Colon polyps Neg Hx    Esophageal cancer Neg Hx    Stomach cancer Neg Hx    Rectal cancer Neg Hx     Social History   Socioeconomic History   Marital status: Married    Spouse name: Not on file   Number of children: 1   Years of education: Not on file   Highest education level: Not on file  Occupational History   Occupation: Interior and spatial designer [employer]    Employer: [employer]  Tobacco Use   Smoking status: Never   Smokeless tobacco: Never  Vaping Use   Vaping Use: Never used  Substance and Sexual Activity   Alcohol use: No    Comment: socially=occasional    Drug use: No   Sexual activity: Yes    Birth control/protection: Condom  Other Topics Concern   Not on file  Social History Narrative   Works - Dance movement psychotherapist, some care through the [name]   Married, second time?     Daughter 81 - [employer] doing well. Has her own place   Occasional alcohol, non-smoker no drugs      Review of Systems:  other review of systems negative except as mentioned in the HPI.  Physical Exam: Vital signs BP (!) 142/88    Pulse 85    Temp 98.4 F (36.9 C) (Temporal)    Resp 17    Ht 6\' 1"  (1.854 m)    Wt (!) 352 lb (159.7 kg)    SpO2 100%    BMI 46.44 kg/m   General:   Alert,  Well-developed, well-nourished, pleasant and cooperative in NAD Lungs:  Clear throughout to auscultation.   Heart:  Regular  rate and rhythm; no murmurs, clicks, rubs,  or gallops. Abdomen:  Soft, nontender and nondistended. Normal bowel sounds.   Neuro/Psych:  Alert and cooperative. Normal mood and affect. A and O x 3   @Rhandi Despain  Simonne Maffucci, MD, [facility] [phone] (pager) 07/02/2021 11:36 AM@

## 2021-07-02 NOTE — Progress Notes (Signed)
Pt's states no medical or surgical changes since previsit or office visit.   CW vitals and EW IV.

## 2021-07-02 NOTE — Progress Notes (Signed)
To PACU, VSS. Report to [email or web address] 

## 2021-07-03 ENCOUNTER — Ambulatory Visit: Payer: Medicare PPO | Admitting: Internal Medicine

## 2021-07-03 ENCOUNTER — Encounter: Payer: Self-pay | Admitting: Internal Medicine

## 2021-07-03 VITALS — BP 124/70 | HR 89 | Temp 98.7°F | Ht 73.0 in | Wt 358.0 lb

## 2021-07-03 DIAGNOSIS — Z0000 Encounter for general adult medical examination without abnormal findings: Secondary | ICD-10-CM | POA: Insufficient documentation

## 2021-07-03 DIAGNOSIS — Z0001 Encounter for general adult medical examination with abnormal findings: Secondary | ICD-10-CM | POA: Insufficient documentation

## 2021-07-03 DIAGNOSIS — E7849 Other hyperlipidemia: Secondary | ICD-10-CM | POA: Diagnosis not present

## 2021-07-03 DIAGNOSIS — E559 Vitamin D deficiency, unspecified: Secondary | ICD-10-CM | POA: Diagnosis not present

## 2021-07-03 DIAGNOSIS — Z6841 Body Mass Index (BMI) 40.0 and over, adult: Secondary | ICD-10-CM

## 2021-07-03 DIAGNOSIS — E538 Deficiency of other specified B group vitamins: Secondary | ICD-10-CM | POA: Diagnosis not present

## 2021-07-03 DIAGNOSIS — I10 Essential (primary) hypertension: Secondary | ICD-10-CM

## 2021-07-03 DIAGNOSIS — E1165 Type 2 diabetes mellitus with hyperglycemia: Secondary | ICD-10-CM | POA: Diagnosis not present

## 2021-07-03 LAB — BASIC METABOLIC PANEL
BUN: 10 mg/dL (ref 6–23)
CO2: 29 mEq/L (ref 19–32)
Calcium: 9.2 mg/dL (ref 8.4–10.5)
Chloride: 104 mEq/L (ref 96–112)
Creatinine, Ser: 0.97 mg/dL (ref 0.40–1.50)
GFR: 87.21 mL/min (ref >60.00)
Glucose, Bld: 145 mg/dL — ABNORMAL HIGH (ref 70–99)
Potassium: 4.1 mEq/L (ref 3.5–5.1)
Sodium: 140 mEq/L (ref 135–145)

## 2021-07-03 LAB — CBC WITH DIFFERENTIAL/PLATELET
Basophils Absolute: 0 10*3/uL (ref 0.0–0.1)
Basophils Relative: 0.4 % (ref 0.0–3.0)
Eosinophils Absolute: 0.1 10*3/uL (ref 0.0–0.7)
Eosinophils Relative: 1 % (ref 0.0–5.0)
HCT: 41.4 % (ref 39.0–52.0)
Hemoglobin: 13.2 g/dL (ref 13.0–17.0)
Lymphocytes Relative: 32 % (ref 12.0–46.0)
Lymphs Abs: 2.1 10*3/uL (ref 0.7–4.0)
MCHC: 31.8 g/dL (ref 30.0–36.0)
MCV: 81.9 fl (ref 78.0–100.0)
Monocytes Absolute: 0.6 10*3/uL (ref 0.1–1.0)
Monocytes Relative: 9.3 % (ref 3.0–12.0)
Neutro Abs: 3.8 10*3/uL (ref 1.4–7.7)
Neutrophils Relative %: 57.3 % (ref 43.0–77.0)
Platelets: 198 10*3/uL (ref 150.0–400.0)
RBC: 5.06 Mil/uL (ref 4.22–5.81)
RDW: 14.8 % (ref 11.5–15.5)
WBC: 6.5 10*3/uL (ref 4.0–10.5)

## 2021-07-03 LAB — VITAMIN B12: Vitamin B-12: 348 pg/mL (ref 211–911)

## 2021-07-03 LAB — URINALYSIS, ROUTINE W REFLEX MICROSCOPIC
Bilirubin Urine: NEGATIVE
Hgb urine dipstick: NEGATIVE
Ketones, ur: NEGATIVE
Leukocytes,Ua: NEGATIVE
Nitrite: NEGATIVE
Specific Gravity, Urine: 1.025 (ref 1.000–1.030)
Total Protein, Urine: NEGATIVE
Urine Glucose: NEGATIVE
Urobilinogen, UA: 0.2 (ref 0.0–1.0)
pH: 6 (ref 5.0–8.0)

## 2021-07-03 LAB — HEPATIC FUNCTION PANEL
ALT: 27 U/L (ref 0–53)
AST: 17 U/L (ref 0–37)
Albumin: 4.2 g/dL (ref 3.5–5.2)
Alkaline Phosphatase: 56 U/L (ref 39–117)
Bilirubin, Direct: 0.2 mg/dL (ref 0.0–0.3)
Total Bilirubin: 0.7 mg/dL (ref 0.2–1.2)
Total Protein: 7.2 g/dL (ref 6.0–8.3)

## 2021-07-03 LAB — VITAMIN D 25 HYDROXY (VIT D DEFICIENCY, FRACTURES): VITD: 47.34 ng/mL (ref 30.00–100.00)

## 2021-07-03 LAB — LIPID PANEL
Cholesterol: 109 mg/dL (ref 0–200)
HDL: 37.1 mg/dL — ABNORMAL LOW (ref >39.00)
LDL Cholesterol: 59 mg/dL (ref 0–99)
NonHDL: 71.9
Total CHOL/HDL Ratio: 3
Triglycerides: 67 mg/dL (ref 0.0–149.0)
VLDL: 13.4 mg/dL (ref 0.0–40.0)

## 2021-07-03 LAB — MICROALBUMIN / CREATININE URINE RATIO
Creatinine,U: 195.3 mg/dL
Microalb Creat Ratio: 0.4 mg/g (ref 0.0–30.0)
Microalb, Ur: <0.7 mg/dL (ref 0.0–1.9)

## 2021-07-03 LAB — TSH: TSH: 1.14 u[IU]/mL (ref 0.35–5.50)

## 2021-07-03 LAB — HEMOGLOBIN A1C: Hgb A1c MFr Bld: 6.6 % — ABNORMAL HIGH (ref 4.6–6.5)

## 2021-07-03 LAB — PSA: PSA: 0.22 ng/mL (ref 0.10–4.00)

## 2021-07-03 MED ORDER — RYBELSUS 3 MG PO TABS: 3.0000 mg | ORAL_TABLET | Freq: Every day | ORAL | 3 refills | Status: DC

## 2021-07-03 NOTE — Progress Notes (Signed)
Patient ID: [name], male   DOB: Jun 05, 1966, 55 y.o.   MRN: [id number]         Chief Complaint:: wellness exam and dm, htn hld, low b12       HPI:  [name] is a 55 y.o. male here for wellness exam; due for hep c screen, decliens covid booster and optho referral for now as states he can get this at the [name]; o/w up to date                        Also s/p covid infection twice so far in past 2 yrs, Pt denies chest pain, increased sob or doe, wheezing, orthopnea, PND, increased LE swelling, palpitations, dizziness or syncope.   Pt denies polydipsia, polyuria, or low sugar symptoms or new focal neuro s/s.  Brother with hx of colon cancer so plans to continue to f/u every 5 yr colonoscopy at the [name].  Cannot seem to lose wt and keeps increasing despite trying for lower calories and increased activity.  Denies worsening reflux, abd pain, dysphagia, n/v, bowel change or blood.  Denies worsening depressive symptoms, suicidal ideation, or panic; has ongoing anxiety.    Wt Readings from Last 3 Encounters:  07/03/21 (!) 358 lb (162.4 kg)  07/02/21 (!) 352 lb (159.7 kg)  04/29/21 (!) 352 lb (159.7 kg)   BP Readings from Last 3 Encounters:  07/03/21 124/70  07/02/21 120/74  01/06/21 112/72   Immunization History  Administered Date(s) Administered   Influenza Split 03/12/2016   Influenza,inj,Quad PF,6+ Mos 07/08/2015, 03/15/2017, 09/29/2018, 03/23/2019, 05/15/2020   Influenza-Unspecified 08/13/2007, 06/12/2009, 04/11/2010, 05/27/2020, 03/31/2021   Moderna Sars-Covid-2 Vaccination 08/28/2019, 09/18/2019, 10/19/2019, 08/07/2020   Pneumococcal Conjugate-13 03/23/2019   Pneumococcal Polysaccharide-23 03/12/2016, 04/27/2016   Zoster Recombinat (Shingrix) 11/16/2019, 01/28/2020, 04/29/2020   Health Maintenance Due  Topic Date Due   Hepatitis C Screening  Never done      Past Medical History:  Diagnosis Date   Acid reflux    Alcohol abuse    Anxiety    Arthritis    Depression 07/08/2015    Diabetes mellitus (HCC)    Diabetes mellitus, type II (HCC)    DJD (degenerative joint disease)    Gallbladder problem    Gallstones    GERD (gastroesophageal reflux disease)    Gout    HTN (hypertension)    Hyperglycemia    Hyperlipidemia    IBS (irritable bowel syndrome)    Joint pain    Low testosterone in male    Male hypogonadism 07/08/2015   Obesity    Osteoarthritis    PONV (postoperative nausea and vomiting)    Sleep apnea    Past Surgical History:  Procedure Laterality Date   Back Cyst Excision     x2   CHOLECYSTECTOMY  08/11/2011   Procedure: LAPAROSCOPIC CHOLECYSTECTOMY WITH INTRAOPERATIVE CHOLANGIOGRAM;  Surgeon: Judieth Keens, DO;  Location: [facility] OR;  Service: General;  Laterality: N/A;  Laparoscopic cholecystectomy with cholangiogram.   COLONOSCOPY WITH PROPOFOL N/A 09/15/2015   Procedure: COLONOSCOPY WITH PROPOFOL;  Surgeon: Gatha Mayer, MD;  Location: [facility];  Service: Endoscopy;  Laterality: N/A;   JOINT REPLACEMENT  01/26/2011   right knee   KNEE ARTHROSCOPY     x 2 left   KNEE ARTHROSCOPY     x 2 right   REPAIR KNEE LIGAMENT     Left   RIGHT FOOT     BONES STRAIGHTENED OUT   2006  Shoulder Surgery, Reconstruction of Joint     Left x 3, Last 02/2007   Tennis elbow surgery, Nerve entrapment     x2 right    reports that he has never smoked. He has never used smokeless tobacco. He reports that he does not drink alcohol and does not use drugs. family history includes Colon cancer (age of onset: 77) in his brother; High Cholesterol in his mother; Obesity in his mother; Ovarian cancer in his maternal grandmother; Sarcoidosis in his mother. No Known Allergies Current Outpatient Medications on File Prior to Visit  Medication Sig Dispense Refill   atorvastatin (LIPITOR) 20 MG tablet Take 20 mg by mouth daily.     Benzoyl Peroxide 10 % LIQD WASH AS DIRECTED AFFECTED AREA DAILY  TO THE FACE AND THE BACK OF THE SCALP DAILY     buPROPion (WELLBUTRIN SR)  150 MG 12 hr tablet Take 150 mg by mouth daily.     busPIRone (BUSPAR) 10 MG tablet Take 10 mg by mouth 3 (three) times daily.     busPIRone (BUSPAR) 15 MG tablet Take 15 mg by mouth at bedtime.     cetirizine (ZYRTEC) 10 MG tablet TAKE ONE TABLET BY MOUTH DAILY FOR ALLERGY     clindamycin (CLEOCIN T) 1 % external solution APPLY SMALL AMOUNT TO AFFECTED AREA DAILY  TO THE [place]  AS NEEDED     clobetasol (TEMOVATE) 0.05 % external solution      cromolyn (OPTICROM) 4 % ophthalmic solution INSTILL 1 DROP INTO EACH EYE 4 TIMES DAILY AT REGULAR INTERVALS.     cyclobenzaprine (FLEXERIL) 5 MG tablet Take 1 tablet (5 mg total) by mouth 3 (three) times daily as needed for muscle spasms. 40 tablet 5   diclofenac sodium (VOLTAREN) 1 % GEL Apply 4 g topically 4 (four) times daily as needed. 400 g 1   dicyclomine (BENTYL) 10 MG capsule      DULoxetine (CYMBALTA) 20 MG capsule Take 2 capsules by mouth 2 (two) times daily.     DULoxetine (CYMBALTA) 30 MG capsule Take 30 mg by mouth daily.     fluticasone (FLONASE) 50 MCG/ACT nasal spray SPRAY 2 SPRAYS INTO EACH NOSTRIL EVERY DAY 48 mL 2   gabapentin (NEURONTIN) 300 MG capsule Take one AM, one PM, two QHS 240 capsule 2   HYDROcodone-acetaminophen (NORCO/VICODIN) 5-325 MG tablet Take 1 tablet by mouth every 6 (six) hours as needed for moderate pain. 40 tablet 0   hydrocortisone 2.5 % cream APPLY SMALL AMOUNT TO AFFECTED AREA TWICE A DAY  TO THE [place] AND THE NECK  AS NEEDED FOR FLARES     indomethacin (INDOCIN) 50 MG capsule TAKE ONE CAPSULE EVERY 6 HOURS AS NEEDED GOUT ONLY 60 capsule 2   lamoTRIgine (LAMICTAL) 25 MG tablet Take 2 tablets by mouth daily.     loperamide (IMODIUM) 2 MG capsule      losartan (COZAAR) 50 MG tablet TAKE ONE-HALF TABLET BY MOUTH DAILY FOR BLOOD PRESSURE (REPLACES OLMESARTAN)     meloxicam (MOBIC) 7.5 MG tablet Take 7.5 mg by mouth daily.     metFORMIN (GLUCOPHAGE) 500 MG tablet TAKE 2 TABLETS BY MOUTH TWICE A DAY 360  tablet 1   metoprolol tartrate (LOPRESSOR) 25 MG tablet      olmesartan (BENICAR) 20 MG tablet TAKE 1 TABLET (20 MG TOTAL) BY MOUTH DAILY. ANNUAL APPT DUE IN SEPT MUST SEE PROVIDER FOR FUTURE REFILLS 90 tablet 1   omeprazole (PRILOSEC) 40 MG capsule  TAKE 1 CAPSULE BY MOUTH EVERY DAY 90 capsule 2   ondansetron (ZOFRAN) 4 MG tablet Take 1 tablet (4 mg total) by mouth every 8 (eight) hours as needed for nausea or vomiting. 20 tablet 0   oxyCODONE-acetaminophen (PERCOCET) 10-325 MG tablet Take 1 tablet by mouth every 8 (eight) hours as needed for pain. 20 tablet 0   prazosin (MINIPRESS) 2 MG capsule Take 1 capsule (2 mg total) by mouth at bedtime. 30 capsule 1   QUEtiapine (SEROQUEL) 25 MG tablet Take 25 mg by mouth at bedtime.     sildenafil (VIAGRA) 100 MG tablet Take by mouth.     testosterone (ANDRODERM) 4 MG/24HR PT24 patch Place 1 patch onto the skin daily. 30 patch 2   traZODone (DESYREL) 50 MG tablet Take 1 tablet (50 mg total) by mouth at bedtime. 30 tablet 1   Vitamin D, Ergocalciferol, (DRISDOL) 1.25 MG (50000 UNIT) CAPS capsule Take 1 capsule (50,000 Units total) by mouth every 7 (seven) days. 4 capsule 0   XIIDRA 5 % SOLN INSTILL 1 DROP INTO BOTH EYES TWICE A DAY     escitalopram (LEXAPRO) 20 MG tablet Take 1 tablet (20 mg total) by mouth daily. 30 tablet 2   No current facility-administered medications on file prior to visit.        ROS:  All others reviewed and negative.  Objective        PE:  BP 124/70 (BP Location: Right Arm, Patient Position: Sitting, Cuff Size: Large)    Pulse 89    Temp 98.7 F (37.1 C) (Oral)    Ht 6\' 1"  (1.854 m)    Wt (!) 358 lb (162.4 kg)    SpO2 97%    BMI 47.23 kg/m                 Constitutional: Pt appears in NAD               HENT: Head: NCAT.                Right Ear: External ear normal.                 Left Ear: External ear normal.                Eyes: . Pupils are equal, round, and reactive to light. Conjunctivae and EOM are normal                Nose: without d/c or deformity               Neck: Neck supple. Gross normal ROM               Cardiovascular: Normal rate and regular rhythm.                 Pulmonary/Chest: Effort normal and breath sounds without rales or wheezing.                Abd:  Soft, NT, ND, + BS, no organomegaly               Neurological: Pt is alert. At baseline orientation, motor grossly intact               Skin: Skin is warm. No rashes, no other new lesions, LE edema - none               Psychiatric: Pt behavior is normal without agitation, anxious depressed affect  Micro: none  Cardiac  tracings I have personally interpreted today:  none  Pertinent Radiological findings (summarize): none   Lab Results  Component Value Date   WBC 6.5 07/03/2021   HGB 13.2 07/03/2021   HCT 41.4 07/03/2021   PLT 198.0 07/03/2021   GLUCOSE 145 (H) 07/03/2021   CHOL 109 07/03/2021   TRIG 67.0 07/03/2021   HDL 37.10 (L) 07/03/2021   LDLCALC 59 07/03/2021   ALT 27 07/03/2021   AST 17 07/03/2021   NA 140 07/03/2021   K 4.1 07/03/2021   CL 104 07/03/2021   CREATININE 0.97 07/03/2021   BUN 10 07/03/2021   CO2 29 07/03/2021   TSH 1.14 07/03/2021   PSA 0.22 07/03/2021   INR 0.96 05/14/2011   HGBA1C 6.6 (H) 07/03/2021   MICROALBUR <0.7 07/03/2021   Assessment/Plan:  [name] is a 56 y.o. Black or African American [2] male with  has a past medical history of Acid reflux, Alcohol abuse, Anxiety, Arthritis, Depression (07/08/2015), Diabetes mellitus ([place]), Diabetes mellitus, type II ([place]), DJD (degenerative joint disease), Gallbladder problem, Gallstones, GERD (gastroesophageal reflux disease), Gout, HTN (hypertension), Hyperglycemia, Hyperlipidemia, IBS (irritable bowel syndrome), Joint pain, Low testosterone in male, Male hypogonadism (07/08/2015), Obesity, Osteoarthritis, PONV (postoperative nausea and vomiting), and Sleep apnea.  Encounter for well adult exam with abnormal findings Age and sex appropriate  education and counseling updated with regular exercise and diet Referrals for preventative services - for hep c screen with labs, [facility] Immunizations addressed - declines covid booster Smoking counseling  - none needed Evidence for depression or other mood disorder - chronic anxious depressed, stable per pt Most recent labs reviewed. I have personally reviewed and have noted: 1) the patient's medical and social history 2) The patient's current medications and supplements 3) The patient's height, weight, and BMI have been recorded in the chart   Hyperlipidemia Lab Results  Component Value Date   LDLCALC 59 07/03/2021   Stable, pt to continue current statin lipitor 20   Essential hypertension BP Readings from Last 3 Encounters:  07/03/21 124/70  07/02/21 120/74  01/06/21 112/72   Stable, pt to continue medical treatment metformin   Diabetes ([place]) Lab Results  Component Value Date   HGBA1C 6.6 (H) 07/03/2021   Stable, pt to continue current medical treatment metfromin   OBESITY NOS Now super morbid, to add rybelsus 3 mg and then 7 mg after month 1 if tolerates well for hopeful wt control assist  Followup: No follow-ups on file.  Cathlean Cower, MD 07/06/2021 4:51 PM [facility]

## 2021-07-03 NOTE — Progress Notes (Signed)
Patient ID: [name], male   DOB: [birth year], 55 y.o.   MRN: [id number]

## 2021-07-03 NOTE — Patient Instructions (Signed)
Please take all new medication as prescribed - the written rx to the [name] for the Rybelsus 3 mg  Please call for the increased dose to 7 mg in 1 months if you do well with the 3 mg without stomach upset  Please continue all other medications as before, and refills have been done if requested.  Please have the pharmacy call with any other refills you may need.  Please continue your efforts at being more active, low cholesterol diet, and weight control.  You are otherwise up to date with prevention measures today.  Please keep your appointments with your specialists as you may have planned  Please go to the LAB at the blood drawing area for the tests to be done  You will be contacted by phone if any changes need to be made immediately.  Otherwise, you will receive a letter about your results with an explanation, but please check with MyChart first.  Please remember to sign up for MyChart if you have not done so, as this will be important to you in the future with finding out test results, communicating by private email, and scheduling acute appointments online when needed.  Please make an Appointment to return in 6 months, or sooner if needed

## 2021-07-06 NOTE — Assessment & Plan Note (Signed)
BP Readings from Last 3 Encounters:  07/03/21 124/70  07/02/21 120/74  01/06/21 112/72   Stable, pt to continue medical treatment metformin

## 2021-07-06 NOTE — Assessment & Plan Note (Signed)
Now super morbid, to add rybelsus 3 mg and then 7 mg after month 1 if tolerates well for hopeful wt control assist

## 2021-07-06 NOTE — Assessment & Plan Note (Signed)
Lab Results  Component Value Date   LDLCALC 59 07/03/2021   Stable, pt to continue current statin lipitor 20

## 2021-07-06 NOTE — Assessment & Plan Note (Signed)
Lab Results  Component Value Date   HGBA1C 6.6 (H) 07/03/2021   Stable, pt to continue current medical treatment metfromin

## 2021-07-06 NOTE — Assessment & Plan Note (Signed)
Age and sex appropriate education and counseling updated with regular exercise and diet Referrals for preventative services - for hep c screen with labs, [facility] Immunizations addressed - declines covid booster Smoking counseling  - none needed Evidence for depression or other mood disorder - chronic anxious depressed, stable per pt Most recent labs reviewed. I have personally reviewed and have noted: 1) the patient's medical and social history 2) The patient's current medications and supplements 3) The patient's height, weight, and BMI have been recorded in the chart

## 2021-07-07 ENCOUNTER — Telehealth: Payer: Self-pay

## 2021-07-07 NOTE — Telephone Encounter (Signed)
°  Follow up Call-  Call back number 07/02/2021  Post procedure Call Back phone  # [phone]  Permission to leave phone message Yes  Some recent data might be hidden     Patient questions:  Do you have a fever, pain , or abdominal swelling? No. Pain Score  0 *  Have you tolerated food without any problems? Yes.    Have you been able to return to your normal activities? Yes.    Do you have any questions about your discharge instructions: Diet   No. Medications  No. Follow up visit  No.  Do you have questions or concerns about your Care? No.  Actions: * If pain score is 4 or above: No action needed, pain <4.

## 2021-07-13 ENCOUNTER — Encounter: Payer: Self-pay | Admitting: Internal Medicine

## 2021-07-13 DIAGNOSIS — Z860101 Personal history of adenomatous and serrated colon polyps: Secondary | ICD-10-CM

## 2021-07-13 DIAGNOSIS — Z86010 Personal history of colonic polyps: Secondary | ICD-10-CM | POA: Insufficient documentation

## 2021-07-13 HISTORY — DX: Personal history of adenomatous and serrated colon polyps: Z86.0101

## 2021-07-13 HISTORY — DX: Personal history of colonic polyps: Z86.010

## 2021-07-20 DIAGNOSIS — H5213 Myopia, bilateral: Secondary | ICD-10-CM | POA: Diagnosis not present

## 2021-07-20 DIAGNOSIS — H43811 Vitreous degeneration, right eye: Secondary | ICD-10-CM | POA: Diagnosis not present

## 2021-07-20 DIAGNOSIS — E119 Type 2 diabetes mellitus without complications: Secondary | ICD-10-CM | POA: Diagnosis not present

## 2021-07-20 DIAGNOSIS — H40013 Open angle with borderline findings, low risk, bilateral: Secondary | ICD-10-CM | POA: Diagnosis not present

## 2021-08-04 ENCOUNTER — Encounter (INDEPENDENT_AMBULATORY_CARE_PROVIDER_SITE_OTHER): Payer: Medicare PPO | Admitting: Ophthalmology

## 2021-08-04 ENCOUNTER — Other Ambulatory Visit: Payer: Self-pay

## 2021-08-04 DIAGNOSIS — E113293 Type 2 diabetes mellitus with mild nonproliferative diabetic retinopathy without macular edema, bilateral: Secondary | ICD-10-CM

## 2021-08-04 DIAGNOSIS — H43813 Vitreous degeneration, bilateral: Secondary | ICD-10-CM | POA: Diagnosis not present

## 2021-08-04 DIAGNOSIS — H35033 Hypertensive retinopathy, bilateral: Secondary | ICD-10-CM | POA: Diagnosis not present

## 2021-08-04 DIAGNOSIS — H35373 Puckering of macula, bilateral: Secondary | ICD-10-CM | POA: Diagnosis not present

## 2021-08-04 DIAGNOSIS — I10 Essential (primary) hypertension: Secondary | ICD-10-CM

## 2021-08-19 ENCOUNTER — Telehealth: Payer: Self-pay | Admitting: Internal Medicine

## 2021-08-19 NOTE — Telephone Encounter (Signed)
Pt had questions about the letter that was sent to pt in regard to his recent Colonoscopy:  All questions answered. Pt verbalized understanding with all questions answered.

## 2021-08-19 NOTE — Telephone Encounter (Signed)
Inbound call from patient, states he would like a call to discuss results from his colonoscopy on 07/02/2021. Please advise.

## 2021-08-27 ENCOUNTER — Encounter: Payer: Self-pay | Admitting: Podiatry

## 2021-08-27 ENCOUNTER — Other Ambulatory Visit: Payer: Self-pay

## 2021-08-27 ENCOUNTER — Ambulatory Visit: Payer: Medicare PPO | Admitting: Podiatry

## 2021-08-27 ENCOUNTER — Ambulatory Visit (INDEPENDENT_AMBULATORY_CARE_PROVIDER_SITE_OTHER): Payer: Medicare PPO

## 2021-08-27 DIAGNOSIS — M778 Other enthesopathies, not elsewhere classified: Secondary | ICD-10-CM

## 2021-08-29 NOTE — Progress Notes (Signed)
He presents today with chief complaint of painful first metatarsophalangeal joint of the left foot states that he was in severe pain last week that he was having a gout attack He has had previous surgery for a Keller arthroplasty with single silicone implant and removal of K wire and deep fixation.  States that the toes feel much better now but was really blown up last week.  Objective: Vital signs are stable he is alert and oriented x3 states that is mildly tender on palpation right underneath and on the side of the foot.  Radiographs taken today do not demonstrate any type of osseous abnormalities there is some mild osseous overgrowth over the [facility] arthroplasty and single silicone implant otherwise it appears to be doing well.  There is minimal edema no cellulitis drainage or odor on physical exam.  Good range of motion.  Assessment: Capsulitis possibly associated with a gout attack but now doing well.  Plan: I put graphite inserts in his shoes and started him back on indomethacin.  Should this patient present again or call with similar complaints he needs a requisition for an arthritic panel including a CBC CMP uric acid rheumatoid ANA sed rate.

## 2021-11-30 DIAGNOSIS — Z6841 Body Mass Index (BMI) 40.0 and over, adult: Secondary | ICD-10-CM | POA: Diagnosis not present

## 2021-11-30 DIAGNOSIS — M25562 Pain in left knee: Secondary | ICD-10-CM | POA: Diagnosis not present

## 2021-11-30 DIAGNOSIS — M1712 Unilateral primary osteoarthritis, left knee: Secondary | ICD-10-CM | POA: Diagnosis not present

## 2021-11-30 DIAGNOSIS — Z96651 Presence of right artificial knee joint: Secondary | ICD-10-CM | POA: Diagnosis not present

## 2022-01-06 ENCOUNTER — Encounter: Payer: Self-pay | Admitting: Internal Medicine

## 2022-01-06 ENCOUNTER — Ambulatory Visit (INDEPENDENT_AMBULATORY_CARE_PROVIDER_SITE_OTHER): Payer: Medicare PPO | Admitting: Internal Medicine

## 2022-01-06 VITALS — BP 112/78 | HR 76 | Temp 97.9°F | Ht 73.0 in | Wt 351.8 lb

## 2022-01-06 DIAGNOSIS — Z125 Encounter for screening for malignant neoplasm of prostate: Secondary | ICD-10-CM | POA: Diagnosis not present

## 2022-01-06 DIAGNOSIS — E559 Vitamin D deficiency, unspecified: Secondary | ICD-10-CM

## 2022-01-06 DIAGNOSIS — M25531 Pain in right wrist: Secondary | ICD-10-CM | POA: Insufficient documentation

## 2022-01-06 DIAGNOSIS — E1165 Type 2 diabetes mellitus with hyperglycemia: Secondary | ICD-10-CM

## 2022-01-06 DIAGNOSIS — F32A Depression, unspecified: Secondary | ICD-10-CM | POA: Diagnosis not present

## 2022-01-06 DIAGNOSIS — Z0001 Encounter for general adult medical examination with abnormal findings: Secondary | ICD-10-CM | POA: Diagnosis not present

## 2022-01-06 DIAGNOSIS — M5431 Sciatica, right side: Secondary | ICD-10-CM | POA: Diagnosis not present

## 2022-01-06 DIAGNOSIS — E538 Deficiency of other specified B group vitamins: Secondary | ICD-10-CM

## 2022-01-06 LAB — BASIC METABOLIC PANEL
BUN: 14 mg/dL (ref 6–23)
CO2: 27 mEq/L (ref 19–32)
Calcium: 9.3 mg/dL (ref 8.4–10.5)
Chloride: 103 mEq/L (ref 96–112)
Creatinine, Ser: 1.03 mg/dL (ref 0.40–1.50)
GFR: 80.86 mL/min (ref >60.00)
Glucose, Bld: 103 mg/dL — ABNORMAL HIGH (ref 70–99)
Potassium: 4.2 mEq/L (ref 3.5–5.1)
Sodium: 138 mEq/L (ref 135–145)

## 2022-01-06 LAB — LIPID PANEL
Cholesterol: 113 mg/dL (ref 0–200)
HDL: 42.7 mg/dL (ref >39.00)
LDL Cholesterol: 59 mg/dL (ref 0–99)
NonHDL: 70.01
Total CHOL/HDL Ratio: 3
Triglycerides: 53 mg/dL (ref 0.0–149.0)
VLDL: 10.6 mg/dL (ref 0.0–40.0)

## 2022-01-06 LAB — CBC WITH DIFFERENTIAL/PLATELET
Basophils Absolute: 0 10*3/uL (ref 0.0–0.1)
Basophils Relative: 0.5 % (ref 0.0–3.0)
Eosinophils Absolute: 0.1 10*3/uL (ref 0.0–0.7)
Eosinophils Relative: 1.2 % (ref 0.0–5.0)
HCT: 43.5 % (ref 39.0–52.0)
Hemoglobin: 13.6 g/dL (ref 13.0–17.0)
Lymphocytes Relative: 31.1 % (ref 12.0–46.0)
Lymphs Abs: 2 10*3/uL (ref 0.7–4.0)
MCHC: 31.2 g/dL (ref 30.0–36.0)
MCV: 83.1 fl (ref 78.0–100.0)
Monocytes Absolute: 0.6 10*3/uL (ref 0.1–1.0)
Monocytes Relative: 8.7 % (ref 3.0–12.0)
Neutro Abs: 3.7 10*3/uL (ref 1.4–7.7)
Neutrophils Relative %: 58.5 % (ref 43.0–77.0)
Platelets: 199 10*3/uL (ref 150.0–400.0)
RBC: 5.24 Mil/uL (ref 4.22–5.81)
RDW: 14.9 % (ref 11.5–15.5)
WBC: 6.4 10*3/uL (ref 4.0–10.5)

## 2022-01-06 LAB — HEMOGLOBIN A1C: Hgb A1c MFr Bld: 6.9 % — ABNORMAL HIGH (ref 4.6–6.5)

## 2022-01-06 LAB — URINALYSIS, ROUTINE W REFLEX MICROSCOPIC
Bilirubin Urine: NEGATIVE
Hgb urine dipstick: NEGATIVE
Ketones, ur: NEGATIVE
Leukocytes,Ua: NEGATIVE
Nitrite: NEGATIVE
Specific Gravity, Urine: 1.01 (ref 1.000–1.030)
Total Protein, Urine: NEGATIVE
Urine Glucose: >=1000 — AB
Urobilinogen, UA: 1 (ref 0.0–1.0)
pH: 6.5 (ref 5.0–8.0)

## 2022-01-06 LAB — HEPATIC FUNCTION PANEL
ALT: 23 U/L (ref 0–53)
AST: 14 U/L (ref 0–37)
Albumin: 4.2 g/dL (ref 3.5–5.2)
Alkaline Phosphatase: 53 U/L (ref 39–117)
Bilirubin, Direct: 0.2 mg/dL (ref 0.0–0.3)
Total Bilirubin: 0.7 mg/dL (ref 0.2–1.2)
Total Protein: 7.2 g/dL (ref 6.0–8.3)

## 2022-01-06 LAB — TSH: TSH: 1.63 u[IU]/mL (ref 0.35–5.50)

## 2022-01-06 LAB — MICROALBUMIN / CREATININE URINE RATIO
Creatinine,U: 86 mg/dL
Microalb Creat Ratio: 0.8 mg/g (ref 0.0–30.0)
Microalb, Ur: <0.7 mg/dL (ref 0.0–1.9)

## 2022-01-06 LAB — VITAMIN D 25 HYDROXY (VIT D DEFICIENCY, FRACTURES): VITD: 49.18 ng/mL (ref 30.00–100.00)

## 2022-01-06 LAB — VITAMIN B12: Vitamin B-12: 238 pg/mL (ref 211–911)

## 2022-01-06 LAB — PSA: PSA: 0.21 ng/mL (ref 0.10–4.00)

## 2022-01-06 NOTE — Progress Notes (Signed)
Patient ID: [name], male   DOB: January 01, 1966, 56 y.o.   MRN: [id number]         Chief Complaint:: wellness exam and Follow-up  Dm, low b12, right sciatica, depression       HPI:  [name] is a 56 y.o. male here for wellness exam; declines hep c screen, covid booster, o/w up to date                        Also s/p left knee cortisone recently trying to hold off of left knee surgury TKR.  Has not yet started the rybelsus, and plans to see [facility] tomorrow to discuss there as well.  Does also have 1 wk onset right lower back pain, mild intermittent with pain to above the right knee, without LE numbness, weakness, or gait change or falls.  Wt down 7 lbs with better diet, but hard to lose more for some reason.  Not sure about the rybelsus but plans to d/w PCP at [facility] as well tomorrow.  Pt denies chest pain, increased sob or doe, wheezing, orthopnea, PND, increased LE swelling, palpitations, dizziness or syncope.   Pt denies polydipsia, polyuria, or new focal neuro s/s.    Pt denies wt loss, night sweats, loss of appetite, or other constitutional symptoms  Denies worsening depressive symptoms, suicidal ideation, or panic   Wt Readings from Last 3 Encounters:  01/06/22 (!) 351 lb 12.8 oz (159.6 kg)  07/03/21 (!) 358 lb (162.4 kg)  07/02/21 (!) 352 lb (159.7 kg)   BP Readings from Last 3 Encounters:  01/06/22 112/78  07/03/21 124/70  07/02/21 120/74   Immunization History  Administered Date(s) Administered   Influenza Split 03/12/2016   Influenza,inj,Quad PF,6+ Mos 07/08/2015, 03/15/2017, 09/29/2018, 03/23/2019, 05/15/2020   Influenza-Unspecified 08/13/2007, 06/12/2009, 04/11/2010, 05/27/2020, 03/31/2021   Moderna Sars-Covid-2 Vaccination 08/28/2019, 09/18/2019, 10/19/2019, 08/07/2020   Pneumococcal Conjugate-13 03/23/2019   Pneumococcal Polysaccharide-23 03/12/2016, 04/27/2016   Tdap 10/19/2021   Zoster Recombinat (Shingrix) 11/16/2019, 01/28/2020, 04/29/2020   There are no preventive care  reminders to display for this patient.     Past Medical History:  Diagnosis Date   Acid reflux    Alcohol abuse    Anxiety    Arthritis    Depression 07/08/2015   Diabetes mellitus (HCC)    Diabetes mellitus, type II (HCC)    DJD (degenerative joint disease)    Gallbladder problem    Gallstones    GERD (gastroesophageal reflux disease)    Gout    HTN (hypertension)    Hx of adenomatous polyp of colon 07/13/2021   Hyperglycemia    Hyperlipidemia    IBS (irritable bowel syndrome)    Joint pain    Low testosterone in male    Male hypogonadism 07/08/2015   Obesity    Osteoarthritis    PONV (postoperative nausea and vomiting)    Sleep apnea    Past Surgical History:  Procedure Laterality Date   Back Cyst Excision     x2   CHOLECYSTECTOMY  08/11/2011   Procedure: LAPAROSCOPIC CHOLECYSTECTOMY WITH INTRAOPERATIVE CHOLANGIOGRAM;  Surgeon: Judieth Keens, DO;  Location: [facility];  Service: General;  Laterality: N/A;  Laparoscopic cholecystectomy with cholangiogram.   COLONOSCOPY WITH PROPOFOL N/A 09/15/2015   Procedure: COLONOSCOPY WITH PROPOFOL;  Surgeon: Gatha Mayer, MD;  Location: [facility];  Service: Endoscopy;  Laterality: N/A;   JOINT REPLACEMENT  01/26/2011   right knee   KNEE ARTHROSCOPY  x 2 left   KNEE ARTHROSCOPY     x 2 right   REPAIR KNEE LIGAMENT     Left   RIGHT FOOT     BONES STRAIGHTENED OUT   2006   Shoulder Surgery, Reconstruction of Joint     Left x 3, Last 02/2007   Tennis elbow surgery, Nerve entrapment     x2 right    reports that he has never smoked. He has never used smokeless tobacco. He reports that he does not drink alcohol and does not use drugs. family history includes Colon cancer (age of onset: 53) in his brother; High Cholesterol in his mother; Obesity in his mother; Ovarian cancer in his maternal grandmother; Sarcoidosis in his mother. No Known Allergies Current Outpatient Medications on File Prior to Visit  Medication Sig Dispense  Refill   atorvastatin (LIPITOR) 20 MG tablet Take 20 mg by mouth daily.     buPROPion (WELLBUTRIN SR) 150 MG 12 hr tablet Take 150 mg by mouth daily.     busPIRone (BUSPAR) 10 MG tablet Take 10 mg by mouth 3 (three) times daily.     busPIRone (BUSPAR) 15 MG tablet Take 15 mg by mouth at bedtime.     clobetasol (TEMOVATE) 0.05 % external solution      cromolyn (OPTICROM) 4 % ophthalmic solution INSTILL 1 DROP INTO EACH EYE 4 TIMES DAILY AT REGULAR INTERVALS.     cyclobenzaprine (FLEXERIL) 5 MG tablet Take 1 tablet (5 mg total) by mouth 3 (three) times daily as needed for muscle spasms. 40 tablet 5   diclofenac sodium (VOLTAREN) 1 % GEL Apply 4 g topically 4 (four) times daily as needed. 400 g 1   dicyclomine (BENTYL) 10 MG capsule      DULoxetine (CYMBALTA) 30 MG capsule Take 30 mg by mouth daily.     fluticasone (FLONASE) 50 MCG/ACT nasal spray SPRAY 2 SPRAYS INTO EACH NOSTRIL EVERY DAY 48 mL 2   gabapentin (NEURONTIN) 300 MG capsule Take one AM, one PM, two QHS 240 capsule 2   HYDROcodone-acetaminophen (NORCO/VICODIN) 5-325 MG tablet Take 1 tablet by mouth every 6 (six) hours as needed for moderate pain. 40 tablet 0   indomethacin (INDOCIN) 50 MG capsule TAKE ONE CAPSULE EVERY 6 HOURS AS NEEDED GOUT ONLY 60 capsule 2   loperamide (IMODIUM) 2 MG capsule      losartan (COZAAR) 50 MG tablet TAKE ONE-HALF TABLET BY MOUTH DAILY FOR BLOOD PRESSURE (REPLACES OLMESARTAN)     meloxicam (MOBIC) 7.5 MG tablet Take 7.5 mg by mouth daily.     metFORMIN (GLUCOPHAGE) 500 MG tablet TAKE 2 TABLETS BY MOUTH TWICE A DAY 360 tablet 1   metoprolol tartrate (LOPRESSOR) 25 MG tablet      olmesartan (BENICAR) 20 MG tablet TAKE 1 TABLET (20 MG TOTAL) BY MOUTH DAILY. ANNUAL APPT DUE IN SEPT MUST SEE PROVIDER FOR FUTURE REFILLS 90 tablet 1   omeprazole (PRILOSEC) 40 MG capsule TAKE 1 CAPSULE BY MOUTH EVERY DAY 90 capsule 2   ondansetron (ZOFRAN) 4 MG tablet Take 1 tablet (4 mg total) by mouth every 8 (eight) hours as  needed for nausea or vomiting. 20 tablet 0   oxyCODONE-acetaminophen (PERCOCET) 10-325 MG tablet Take 1 tablet by mouth every 8 (eight) hours as needed for pain. 20 tablet 0   prazosin (MINIPRESS) 2 MG capsule Take 1 capsule (2 mg total) by mouth at bedtime. 30 capsule 1   QUEtiapine (SEROQUEL) 25 MG tablet Take 25 mg by  mouth at bedtime.     Semaglutide (RYBELSUS) 3 MG TABS Take 3 mg by mouth daily. 90 tablet 3   sildenafil (VIAGRA) 100 MG tablet Take by mouth.     testosterone (ANDRODERM) 4 MG/24HR PT24 patch Place 1 patch onto the skin daily. 30 patch 2   traZODone (DESYREL) 50 MG tablet Take 1 tablet (50 mg total) by mouth at bedtime. 30 tablet 1   Vitamin D, Ergocalciferol, (DRISDOL) 1.25 MG (50000 UNIT) CAPS capsule Take 1 capsule (50,000 Units total) by mouth every 7 (seven) days. 4 capsule 0   XIIDRA 5 % SOLN INSTILL 1 DROP INTO BOTH EYES TWICE A DAY     cetirizine (ZYRTEC) 10 MG tablet TAKE ONE TABLET BY MOUTH DAILY FOR ALLERGY     DULoxetine (CYMBALTA) 20 MG capsule Take 2 capsules by mouth 2 (two) times daily.     escitalopram (LEXAPRO) 20 MG tablet Take 1 tablet (20 mg total) by mouth daily. 30 tablet 2   lamoTRIgine (LAMICTAL) 25 MG tablet Take 2 tablets by mouth daily.     No current facility-administered medications on file prior to visit.        ROS:  All others reviewed and negative.  Objective        PE:  BP 112/78 (BP Location: Left Arm, Patient Position: Sitting, Cuff Size: Large)   Pulse 76   Temp 97.9 F (36.6 C) (Oral)   Ht '6\' 1"'$  (1.854 m)   Wt (!) 351 lb 12.8 oz (159.6 kg)   SpO2 95%   BMI 46.41 kg/m                 Constitutional: Pt appears in NAD               HENT: Head: NCAT.                Right Ear: External ear normal.                 Left Ear: External ear normal.                Eyes: . Pupils are equal, round, and reactive to light. Conjunctivae and EOM are normal               Nose: without d/c or deformity               Neck: Neck supple. Gross  normal ROM               Cardiovascular: Normal rate and regular rhythm.                 Pulmonary/Chest: Effort normal and breath sounds without rales or wheezing.                Abd:  Soft, NT, ND, + BS, no organomegaly               Neurological: Pt is alert. At baseline orientation, motor grossly intact               Skin: Skin is warm. No rashes, no other new lesions, LE edema - none               Psychiatric: Pt behavior is normal without agitation   Micro: none  Cardiac tracings I have personally interpreted today:  none  Pertinent Radiological findings (summarize): none   Lab Results  Component Value Date   WBC 6.4 01/06/2022   HGB 13.6 01/06/2022  HCT 43.5 01/06/2022   PLT 199.0 01/06/2022   GLUCOSE 103 (H) 01/06/2022   CHOL 113 01/06/2022   TRIG 53.0 01/06/2022   HDL 42.70 01/06/2022   LDLCALC 59 01/06/2022   ALT 23 01/06/2022   AST 14 01/06/2022   NA 138 01/06/2022   K 4.2 01/06/2022   CL 103 01/06/2022   CREATININE 1.03 01/06/2022   BUN 14 01/06/2022   CO2 27 01/06/2022   TSH 1.63 01/06/2022   PSA 0.21 01/06/2022   INR 0.96 05/14/2011   HGBA1C 6.9 (H) 01/06/2022   MICROALBUR <0.7 01/06/2022   Assessment/Plan:  [name] is a 56 y.o. Black or African American [2] male with  has a past medical history of Acid reflux, Alcohol abuse, Anxiety, Arthritis, Depression (07/08/2015), Diabetes mellitus ([place]), Diabetes mellitus, type II ([place]), DJD (degenerative joint disease), Gallbladder problem, Gallstones, GERD (gastroesophageal reflux disease), Gout, HTN (hypertension), adenomatous polyp of colon (07/13/2021), Hyperglycemia, Hyperlipidemia, IBS (irritable bowel syndrome), Joint pain, Low testosterone in male, Male hypogonadism (07/08/2015), Obesity, Osteoarthritis, PONV (postoperative nausea and vomiting), and Sleep apnea.  Encounter for well adult exam with abnormal findings Age and sex appropriate education and counseling updated with regular exercise and  diet Referrals for preventative services - decliens hep c screen Immunizations addressed - declines covid booster Smoking counseling  - none needed Evidence for depression or other mood disorder - none significant Most recent labs reviewed. I have personally reviewed and have noted: 1) the patient's medical and social history 2) The patient's current medications and supplements 3) The patient's height, weight, and BMI have been recorded in the chart   Vitamin B 12 deficiency Lab Results  Component Value Date   VITAMINB12 238 01/06/2022   Low, to start oral replacement - b12 1000 mcg qd  Type 2 diabetes mellitus without complications ([place]) Lab Results  Component Value Date   HGBA1C 6.9 (H) 01/06/2022   Stable, pt to continue current medical treatment metformin 1000 mg bid   Diabetes ([place]) Lab Results  Component Value Date   HGBA1C 6.9 (H) 01/06/2022   Mild uncontrolled, pt to continue current medical treatment metformin 1000 bid, I suggested starting rybelsus but [facility]  Sciatica, right side Recent onset new, mild, without neuro change, pt declines med change such as gabapentin  Depression Stable per pt, declines need for change in tx,  to f/u any worsening symptoms or concerns  Followup: Return in about 6 months (around 07/08/2022).  Brendan Cower, MD 01/09/2022 9:07 PM [facility]

## 2022-01-06 NOTE — Patient Instructions (Addendum)
Please ask at the [facility] tomorrow about the rybelsus  Please continue all other medications as before, and refills have been done if requested.  Please have the pharmacy call with any other refills you may need.  Please continue your efforts at being more active, low cholesterol diet, and weight control.  You are otherwise up to date with prevention measures today.  Please keep your appointments with your specialists as you may have planned  Please go to the LAB at the blood drawing area for the tests to be done  You will be contacted by phone if any changes need to be made immediately.  Otherwise, you will receive a letter about your results with an explanation, but please check with MyChart first.  Please remember to sign up for MyChart if you have not done so, as this will be important to you in the future with finding out test results, communicating by private email, and scheduling acute appointments online when needed.  Please make an Appointment to return in 6 months, or sooner if needed

## 2022-01-09 ENCOUNTER — Encounter: Payer: Self-pay | Admitting: Internal Medicine

## 2022-01-09 DIAGNOSIS — M5431 Sciatica, right side: Secondary | ICD-10-CM | POA: Insufficient documentation

## 2022-01-09 NOTE — Assessment & Plan Note (Signed)
Age and sex appropriate education and counseling updated with regular exercise and diet Referrals for preventative services - decliens hep c screen Immunizations addressed - declines covid booster Smoking counseling  - none needed Evidence for depression or other mood disorder - none significant Most recent labs reviewed. I have personally reviewed and have noted: 1) the patient's medical and social history 2) The patient's current medications and supplements 3) The patient's height, weight, and BMI have been recorded in the chart

## 2022-01-09 NOTE — Assessment & Plan Note (Signed)
Lab Results  Component Value Date   VITAMINB12 238 01/06/2022   Low, to start oral replacement - b12 1000 mcg qd

## 2022-01-09 NOTE — Assessment & Plan Note (Signed)
Lab Results  Component Value Date   HGBA1C 6.9 (H) 01/06/2022   Stable, pt to continue current medical treatment metformin 1000 mg bid

## 2022-01-09 NOTE — Assessment & Plan Note (Signed)
Stable per pt, declines need for change in tx,  to f/u any worsening symptoms or concerns

## 2022-01-09 NOTE — Assessment & Plan Note (Signed)
Recent onset new, mild, without neuro change, pt declines med change such as gabapentin

## 2022-01-09 NOTE — Assessment & Plan Note (Addendum)
Lab Results  Component Value Date   HGBA1C 6.9 (H) 01/06/2022   Mild uncontrolled, pt to continue current medical treatment metformin 1000 bid, I suggested starting rybelsus but [facility]

## 2022-01-18 DIAGNOSIS — M778 Other enthesopathies, not elsewhere classified: Secondary | ICD-10-CM | POA: Diagnosis not present

## 2022-01-18 DIAGNOSIS — M25531 Pain in right wrist: Secondary | ICD-10-CM | POA: Diagnosis not present

## 2022-01-18 DIAGNOSIS — M779 Enthesopathy, unspecified: Secondary | ICD-10-CM | POA: Diagnosis not present

## 2022-01-18 DIAGNOSIS — M25521 Pain in right elbow: Secondary | ICD-10-CM | POA: Diagnosis not present

## 2022-01-18 DIAGNOSIS — S59901A Unspecified injury of right elbow, initial encounter: Secondary | ICD-10-CM | POA: Diagnosis not present

## 2022-02-17 ENCOUNTER — Encounter (INDEPENDENT_AMBULATORY_CARE_PROVIDER_SITE_OTHER): Payer: Self-pay

## 2022-06-09 ENCOUNTER — Telehealth: Payer: Self-pay | Admitting: Internal Medicine

## 2022-06-09 NOTE — Telephone Encounter (Signed)
N/A unable to leave a message for patient to call back to schedule Medicare Annual Wellness Visit   No hx of AWV eligible as of 12/10/21  Please schedule at anytime with [facility] Advisor if patient calls the office back.     Any questions, please call me at [phone]

## 2022-07-08 ENCOUNTER — Encounter: Payer: Self-pay | Admitting: Internal Medicine

## 2022-07-08 ENCOUNTER — Ambulatory Visit (INDEPENDENT_AMBULATORY_CARE_PROVIDER_SITE_OTHER): Payer: Medicare PPO | Admitting: Internal Medicine

## 2022-07-08 VITALS — BP 120/80 | HR 83 | Temp 97.9°F | Ht 73.0 in | Wt 357.0 lb

## 2022-07-08 DIAGNOSIS — E7849 Other hyperlipidemia: Secondary | ICD-10-CM

## 2022-07-08 DIAGNOSIS — E538 Deficiency of other specified B group vitamins: Secondary | ICD-10-CM

## 2022-07-08 DIAGNOSIS — E559 Vitamin D deficiency, unspecified: Secondary | ICD-10-CM

## 2022-07-08 DIAGNOSIS — E1165 Type 2 diabetes mellitus with hyperglycemia: Secondary | ICD-10-CM | POA: Diagnosis not present

## 2022-07-08 LAB — HEMOGLOBIN A1C: Hgb A1c MFr Bld: 7.1 % — ABNORMAL HIGH (ref 4.6–6.5)

## 2022-07-08 LAB — BASIC METABOLIC PANEL
BUN: 12 mg/dL (ref 6–23)
CO2: 30 mEq/L (ref 19–32)
Calcium: 9.9 mg/dL (ref 8.4–10.5)
Chloride: 103 mEq/L (ref 96–112)
Creatinine, Ser: 1.04 mg/dL (ref 0.40–1.50)
GFR: 79.65 mL/min (ref >60.00)
Glucose, Bld: 120 mg/dL — ABNORMAL HIGH (ref 70–99)
Potassium: 4 mEq/L (ref 3.5–5.1)
Sodium: 140 mEq/L (ref 135–145)

## 2022-07-08 LAB — HEPATIC FUNCTION PANEL
ALT: 26 U/L (ref 0–53)
AST: 18 U/L (ref 0–37)
Albumin: 4.2 g/dL (ref 3.5–5.2)
Alkaline Phosphatase: 46 U/L (ref 39–117)
Bilirubin, Direct: 0.2 mg/dL (ref 0.0–0.3)
Total Bilirubin: 0.9 mg/dL (ref 0.2–1.2)
Total Protein: 7.5 g/dL (ref 6.0–8.3)

## 2022-07-08 LAB — LIPID PANEL
Cholesterol: 113 mg/dL (ref 0–200)
HDL: 37.5 mg/dL — ABNORMAL LOW (ref >39.00)
LDL Cholesterol: 65 mg/dL (ref 0–99)
NonHDL: 75.55
Total CHOL/HDL Ratio: 3
Triglycerides: 51 mg/dL (ref 0.0–149.0)
VLDL: 10.2 mg/dL (ref 0.0–40.0)

## 2022-07-08 MED ORDER — RYBELSUS 7 MG PO TABS: 7.0000 mg | ORAL_TABLET | Freq: Every day | ORAL | 3 refills | Status: DC

## 2022-07-08 MED ORDER — RYBELSUS 3 MG PO TABS: 3.0000 mg | ORAL_TABLET | Freq: Every day | ORAL | 0 refills | Status: DC

## 2022-07-08 NOTE — Progress Notes (Signed)
Patient ID: [name], male   DOB: 07/06/1966, 56 y.o.   MRN: [id number]        Chief Complaint: follow up HTN, HLD and DM, obesity       HPI:  [name] is a 56 y.o. male here overall doing ok, Last A1c 3 mo ago was 6.4; hard to lose wt with diet and exercise; Needs to get under 300 lbs for left knee TKR.   Has eye exam sched next mo with Dr Katy Fitch.  Pt denies chest pain, increased sob or doe, wheezing, orthopnea, PND, increased LE swelling, palpitations, dizziness or syncope.   Pt denies polydipsia, polyuria, or new focal neuro s/s.    Pt denies fever, wt loss, night sweats, loss of appetite, or other constitutional symptoms  Did not start rybelsus but willing to start now if covered by the [name].    Wt Readings from Last 3 Encounters:  07/08/22 (!) 357 lb (161.9 kg)  01/06/22 (!) 351 lb 12.8 oz (159.6 kg)  07/03/21 (!) 358 lb (162.4 kg)   BP Readings from Last 3 Encounters:  07/08/22 120/80  01/06/22 112/78  07/03/21 124/70         Past Medical History:  Diagnosis Date   Acid reflux    Alcohol abuse    Anxiety    Arthritis    Depression 07/08/2015   Diabetes mellitus (HCC)    Diabetes mellitus, type II (HCC)    DJD (degenerative joint disease)    Gallbladder problem    Gallstones    GERD (gastroesophageal reflux disease)    Gout    HTN (hypertension)    Hx of adenomatous polyp of colon 07/13/2021   Hyperglycemia    Hyperlipidemia    IBS (irritable bowel syndrome)    Joint pain    Low testosterone in male    Male hypogonadism 07/08/2015   Obesity    Osteoarthritis    PONV (postoperative nausea and vomiting)    Sleep apnea    Past Surgical History:  Procedure Laterality Date   Back Cyst Excision     x2   CHOLECYSTECTOMY  08/11/2011   Procedure: LAPAROSCOPIC CHOLECYSTECTOMY WITH INTRAOPERATIVE CHOLANGIOGRAM;  Surgeon: Judieth Keens, DO;  Location: [facility];  Service: General;  Laterality: N/A;  Laparoscopic cholecystectomy with cholangiogram.   COLONOSCOPY WITH PROPOFOL  N/A 09/15/2015   Procedure: COLONOSCOPY WITH PROPOFOL;  Surgeon: Gatha Mayer, MD;  [facility]: [facility];  Service: Endoscopy;  Laterality: N/A;   JOINT REPLACEMENT  01/26/2011   right knee   KNEE ARTHROSCOPY     x 2 left   KNEE ARTHROSCOPY     x 2 right   REPAIR KNEE LIGAMENT     Left   RIGHT FOOT     BONES STRAIGHTENED OUT   2006   Shoulder Surgery, Reconstruction of Joint     Left x 3, Last 02/2007   Tennis elbow surgery, Nerve entrapment     x2 right    reports that he has never smoked. He has never used smokeless tobacco. He reports that he does not drink alcohol and does not use drugs. family history includes Colon cancer (age of onset: 68) in his brother; High Cholesterol in his mother; Obesity in his mother; Ovarian cancer in his maternal grandmother; Sarcoidosis in his mother. No Known Allergies Current Outpatient Medications on File Prior to Visit  Medication Sig Dispense Refill   atorvastatin (LIPITOR) 20 MG tablet Take 20 mg by mouth daily.  buPROPion (WELLBUTRIN SR) 150 MG 12 hr tablet Take 150 mg by mouth daily.     busPIRone (BUSPAR) 10 MG tablet Take 10 mg by mouth 3 (three) times daily.     busPIRone (BUSPAR) 15 MG tablet Take 15 mg by mouth at bedtime.     clobetasol (TEMOVATE) 0.05 % external solution      cromolyn (OPTICROM) 4 % ophthalmic solution INSTILL 1 DROP INTO EACH EYE 4 TIMES DAILY AT REGULAR INTERVALS.     cyclobenzaprine (FLEXERIL) 5 MG tablet Take 1 tablet (5 mg total) by mouth 3 (three) times daily as needed for muscle spasms. 40 tablet 5   diclofenac sodium (VOLTAREN) 1 % GEL Apply 4 g topically 4 (four) times daily as needed. 400 g 1   dicyclomine (BENTYL) 10 MG capsule      DULoxetine (CYMBALTA) 30 MG capsule Take 30 mg by mouth daily.     fluticasone (FLONASE) 50 MCG/ACT nasal spray SPRAY 2 SPRAYS INTO EACH NOSTRIL EVERY DAY 48 mL 2   gabapentin (NEURONTIN) 300 MG capsule Take one AM, one PM, two QHS 240 capsule 2   HYDROcodone-acetaminophen  (NORCO/VICODIN) 5-325 MG tablet Take 1 tablet by mouth every 6 (six) hours as needed for moderate pain. 40 tablet 0   indomethacin (INDOCIN) 50 MG capsule TAKE ONE CAPSULE EVERY 6 HOURS AS NEEDED GOUT ONLY 60 capsule 2   loperamide (IMODIUM) 2 MG capsule      losartan (COZAAR) 50 MG tablet TAKE ONE-HALF TABLET BY MOUTH DAILY FOR BLOOD PRESSURE (REPLACES OLMESARTAN)     meloxicam (MOBIC) 7.5 MG tablet Take 7.5 mg by mouth daily.     metFORMIN (GLUCOPHAGE) 500 MG tablet TAKE 2 TABLETS BY MOUTH TWICE A DAY 360 tablet 1   metoprolol tartrate (LOPRESSOR) 25 MG tablet      olmesartan (BENICAR) 20 MG tablet TAKE 1 TABLET (20 MG TOTAL) BY MOUTH DAILY. ANNUAL APPT DUE IN SEPT MUST SEE PROVIDER FOR FUTURE REFILLS 90 tablet 1   omeprazole (PRILOSEC) 40 MG capsule TAKE 1 CAPSULE BY MOUTH EVERY DAY 90 capsule 2   ondansetron (ZOFRAN) 4 MG tablet Take 1 tablet (4 mg total) by mouth every 8 (eight) hours as needed for nausea or vomiting. 20 tablet 0   oxyCODONE-acetaminophen (PERCOCET) 10-325 MG tablet Take 1 tablet by mouth every 8 (eight) hours as needed for pain. 20 tablet 0   prazosin (MINIPRESS) 2 MG capsule Take 1 capsule (2 mg total) by mouth at bedtime. 30 capsule 1   QUEtiapine (SEROQUEL) 25 MG tablet Take 25 mg by mouth at bedtime.     sildenafil (VIAGRA) 100 MG tablet Take by mouth.     testosterone (ANDRODERM) 4 MG/24HR PT24 patch Place 1 patch onto the skin daily. 30 patch 2   traZODone (DESYREL) 50 MG tablet Take 1 tablet (50 mg total) by mouth at bedtime. 30 tablet 1   Vitamin D, Ergocalciferol, (DRISDOL) 1.25 MG (50000 UNIT) CAPS capsule Take 1 capsule (50,000 Units total) by mouth every 7 (seven) days. 4 capsule 0   XIIDRA 5 % SOLN INSTILL 1 DROP INTO BOTH EYES TWICE A DAY     cetirizine (ZYRTEC) 10 MG tablet TAKE ONE TABLET BY MOUTH DAILY FOR ALLERGY     DULoxetine (CYMBALTA) 20 MG capsule Take 2 capsules by mouth 2 (two) times daily.     escitalopram (LEXAPRO) 20 MG tablet Take 1 tablet (20  mg total) by mouth daily. 30 tablet 2   lamoTRIgine (LAMICTAL) 25 MG  tablet Take 2 tablets by mouth daily.     No current facility-administered medications on file prior to visit.        ROS:  All others reviewed and negative.  Objective        PE:  BP 120/80 (BP Location: Right Arm, Patient Position: Sitting, Cuff Size: Normal)   Pulse 83   Temp 97.9 F (36.6 C) (Oral)   Ht '6\' 1"'$  (1.854 m)   Wt (!) 357 lb (161.9 kg)   SpO2 93%   BMI 47.10 kg/m                 Constitutional: Pt appears in NAD               HENT: Head: NCAT.                Right Ear: External ear normal.                 Left Ear: External ear normal.                Eyes: . Pupils are equal, round, and reactive to light. Conjunctivae and EOM are normal               Nose: without d/c or deformity               Neck: Neck supple. Gross normal ROM               Cardiovascular: Normal rate and regular rhythm.                 Pulmonary/Chest: Effort normal and breath sounds without rales or wheezing.                Abd:  Soft, NT, ND, + BS, no organomegaly               Neurological: Pt is alert. At baseline orientation, motor grossly intact               Skin: Skin is warm. No rashes, no other new lesions, LE edema - none               Psychiatric: Pt behavior is normal without agitation   Micro: none  Cardiac tracings I have personally interpreted today:  none  Pertinent Radiological findings (summarize): none   Lab Results  Component Value Date   WBC 6.4 01/06/2022   HGB 13.6 01/06/2022   HCT 43.5 01/06/2022   PLT 199.0 01/06/2022   GLUCOSE 103 (H) 01/06/2022   CHOL 113 01/06/2022   TRIG 53.0 01/06/2022   HDL 42.70 01/06/2022   LDLCALC 59 01/06/2022   ALT 23 01/06/2022   AST 14 01/06/2022   NA 138 01/06/2022   K 4.2 01/06/2022   CL 103 01/06/2022   CREATININE 1.03 01/06/2022   BUN 14 01/06/2022   CO2 27 01/06/2022   TSH 1.63 01/06/2022   PSA 0.21 01/06/2022   INR 0.96 05/14/2011   HGBA1C 6.9  (H) 01/06/2022   MICROALBUR <0.7 01/06/2022   Assessment/Plan:  Phu Record is a 56 y.o. Black or African American [2] male with  has a past medical history of Acid reflux, Alcohol abuse, Anxiety, Arthritis, Depression (07/08/2015), Diabetes mellitus ([place]), Diabetes mellitus, type II ([place]), DJD (degenerative joint disease), Gallbladder problem, Gallstones, GERD (gastroesophageal reflux disease), Gout, HTN (hypertension), adenomatous polyp of colon (07/13/2021), Hyperglycemia, Hyperlipidemia, IBS (irritable bowel syndrome), Joint pain, Low testosterone in male, Male hypogonadism (07/08/2015), Obesity,  Osteoarthritis, PONV (postoperative nausea and vomiting), and Sleep apnea.  Diabetes ([place]) Lab Results  Component Value Date   HGBA1C 6.9 (H) 01/06/2022   Mild uncontrolled in the setting of morbid obesity,, pt to continue current medical treatment metformin 1000 bid, but start rybelsus 3 mg x 1 mo, then 7 mg after   Hyperlipidemia Lab Results  Component Value Date   LDLCALC 59 01/06/2022   Stable, pt to continue current statin lipitor 20 mg qd    Vitamin B 12 deficiency Lab Results  Component Value Date   VITAMINB12 238 01/06/2022   Low reminded to start oral replacement - b12 1000 mcg qd  Followup: Return in about 6 months (around 01/07/2023).  Cathlean Cower, MD 07/08/2022 8:46 PM [facility]

## 2022-07-08 NOTE — Assessment & Plan Note (Addendum)
Lab Results  Component Value Date   HGBA1C 6.9 (H) 01/06/2022   Mild uncontrolled in the setting of morbid obesity,, pt to continue current medical treatment metformin 1000 bid, but start rybelsus 3 mg x 1 mo, then 7 mg after

## 2022-07-08 NOTE — Assessment & Plan Note (Signed)
Lab Results  Component Value Date   VITAMINB12 238 01/06/2022   Low reminded to start oral replacement - b12 1000 mcg qd

## 2022-07-08 NOTE — Patient Instructions (Signed)
Please take all new medication as prescribed - the rybelsus at 3 mg per day for the first month, then 7 mg per day after that  Please continue all other medications as before, and refills have been done if requested.  Please have the pharmacy call with any other refills you may need.  Please continue your efforts at being more active, low cholesterol diet, and weight control.  Please keep your appointments with your specialists as you may have planned  Please go to the LAB at the blood drawing area for the tests to be done  You will be contacted by phone if any changes need to be made immediately.  Otherwise, you will receive a letter about your results with an explanation, but please check with MyChart first.  Please remember to sign up for MyChart if you have not done so, as this will be important to you in the future with finding out test results, communicating by private email, and scheduling acute appointments online when needed.  Please make an Appointment to return in 6 months, or sooner if needed

## 2022-07-08 NOTE — Assessment & Plan Note (Signed)
Lab Results  Component Value Date   LDLCALC 59 01/06/2022   Stable, pt to continue current statin lipitor 20 mg qd

## 2022-07-09 LAB — VITAMIN D 25 HYDROXY (VIT D DEFICIENCY, FRACTURES): VITD: 59.46 ng/mL (ref 30.00–100.00)

## 2022-07-21 DIAGNOSIS — H43811 Vitreous degeneration, right eye: Secondary | ICD-10-CM | POA: Diagnosis not present

## 2022-07-21 DIAGNOSIS — H40013 Open angle with borderline findings, low risk, bilateral: Secondary | ICD-10-CM | POA: Diagnosis not present

## 2022-07-21 DIAGNOSIS — E119 Type 2 diabetes mellitus without complications: Secondary | ICD-10-CM | POA: Diagnosis not present

## 2022-07-29 ENCOUNTER — Encounter: Payer: Self-pay | Admitting: Podiatry

## 2022-07-29 ENCOUNTER — Ambulatory Visit: Payer: Medicare PPO | Admitting: Podiatry

## 2022-07-29 VITALS — BP 126/78 | HR 90

## 2022-07-29 DIAGNOSIS — M778 Other enthesopathies, not elsewhere classified: Secondary | ICD-10-CM | POA: Diagnosis not present

## 2022-07-29 NOTE — Progress Notes (Signed)
He presents today for follow-up of his capsulitis first metatarsophalangeal joint of the left foot.  He is questioning whether or not he can still have a gout attack to his left foot even though he has had a joint replacement first metatarsophalangeal joint left.  States that he still gets stiffness in the first metatarsophalangeal joint and in particular when the weather changes.  He does not recall receiving the graphite insert the last time he was here.  Objective: Vital signs stable oriented x 3 pulses are palpable.  The foot looks the best of scenic in years at this point he has no edema no erythema cellulitis drainage or odor he is lost considerable weight due to diet and diet and other dietary changes.  He has limited range of motion of the first metatarsophalangeal joint but is still better than he had prior to surgery.  There is no erythema edema salines  Signs of infection or signs of gout at this point.  Assessment: Well-healing surgical foot no complications at this time other than some stiffness and some capsulitis.  Plan: Discussed etiology pathology and surgical therapies at this point I put him in bilateral graphite insoles to help with preventing his first metatarsophalangeal phalangeal joint bending.  Should this patient never called stating that he has gout I want him to come in immediately to pick up a order for blood work consisting of a CBC a CMP and an arthritic profile particular uric acid sed rate and ANA.

## 2022-08-04 ENCOUNTER — Encounter (INDEPENDENT_AMBULATORY_CARE_PROVIDER_SITE_OTHER): Payer: Medicare PPO | Admitting: Ophthalmology

## 2022-08-04 DIAGNOSIS — E113293 Type 2 diabetes mellitus with mild nonproliferative diabetic retinopathy without macular edema, bilateral: Secondary | ICD-10-CM | POA: Diagnosis not present

## 2022-08-04 DIAGNOSIS — I10 Essential (primary) hypertension: Secondary | ICD-10-CM | POA: Diagnosis not present

## 2022-08-04 DIAGNOSIS — H43813 Vitreous degeneration, bilateral: Secondary | ICD-10-CM

## 2022-08-04 DIAGNOSIS — H35373 Puckering of macula, bilateral: Secondary | ICD-10-CM | POA: Diagnosis not present

## 2022-08-04 DIAGNOSIS — H35033 Hypertensive retinopathy, bilateral: Secondary | ICD-10-CM | POA: Diagnosis not present

## 2022-08-25 DIAGNOSIS — E119 Type 2 diabetes mellitus without complications: Secondary | ICD-10-CM | POA: Diagnosis not present

## 2022-08-25 DIAGNOSIS — H43813 Vitreous degeneration, bilateral: Secondary | ICD-10-CM | POA: Diagnosis not present

## 2022-08-25 DIAGNOSIS — H43811 Vitreous degeneration, right eye: Secondary | ICD-10-CM | POA: Diagnosis not present

## 2022-08-25 DIAGNOSIS — H43812 Vitreous degeneration, left eye: Secondary | ICD-10-CM | POA: Diagnosis not present

## 2022-08-25 DIAGNOSIS — H35373 Puckering of macula, bilateral: Secondary | ICD-10-CM | POA: Diagnosis not present

## 2022-08-30 ENCOUNTER — Encounter (INDEPENDENT_AMBULATORY_CARE_PROVIDER_SITE_OTHER): Payer: Medicare PPO | Admitting: Ophthalmology

## 2022-08-30 DIAGNOSIS — H43813 Vitreous degeneration, bilateral: Secondary | ICD-10-CM | POA: Diagnosis not present

## 2022-08-30 DIAGNOSIS — H35033 Hypertensive retinopathy, bilateral: Secondary | ICD-10-CM | POA: Diagnosis not present

## 2022-08-30 DIAGNOSIS — I10 Essential (primary) hypertension: Secondary | ICD-10-CM | POA: Diagnosis not present

## 2022-08-30 DIAGNOSIS — H35372 Puckering of macula, left eye: Secondary | ICD-10-CM

## 2022-08-30 DIAGNOSIS — E113293 Type 2 diabetes mellitus with mild nonproliferative diabetic retinopathy without macular edema, bilateral: Secondary | ICD-10-CM

## 2022-09-02 ENCOUNTER — Telehealth: Payer: Self-pay

## 2022-09-02 NOTE — Telephone Encounter (Signed)
Called patient to schedule Medicare Annual Wellness Visit (AWV). Left message for patient to call back and schedule Medicare Annual Wellness Visit (AWV).  Last date of AWV: NO HX of AWV, eligible 12/10/21  Please schedule an appointment at any time with [facility].  Norton Blizzard, [employer] ([facility])  [facility] [phone]

## 2022-09-02 NOTE — Telephone Encounter (Signed)
Contacted [name] to schedule their annual wellness visit. Appointment made for 09/21/22.  Norton Blizzard, [place] ([facility])  [place] [facility] [phone]

## 2022-09-21 ENCOUNTER — Ambulatory Visit (INDEPENDENT_AMBULATORY_CARE_PROVIDER_SITE_OTHER): Payer: Medicare PPO

## 2022-09-21 VITALS — Ht 73.0 in | Wt 330.0 lb

## 2022-09-21 DIAGNOSIS — Z0000 Encounter for general adult medical examination without abnormal findings: Secondary | ICD-10-CM

## 2022-09-21 NOTE — Patient Instructions (Signed)
Mr. [name] , Thank you for taking time to come for your Medicare Wellness Visit. I appreciate your ongoing commitment to your health goals. Please review the following plan we discussed and let me know if I can assist you in the future.   These are the goals we discussed:  Goals      Client understands the importance of follow-up with providers by attending scheduled visits     Continue to stay active, maintain your health and engage in social activities.        This is a list of the screening recommended for you and due dates:  Health Maintenance  Topic Date Due   COVID-19 Vaccine (5 - 2023-24 season) 03/12/2022   Eye exam for diabetics  03/23/2022   Hepatitis C Screening: USPSTF Recommendation to screen - Ages 18-79 yo.  01/10/2023*   Yearly kidney health urinalysis for diabetes  01/07/2023   Complete foot exam   01/07/2023   Hemoglobin A1C  01/07/2023   Yearly kidney function blood test for diabetes  07/09/2023   Medicare Annual Wellness Visit  09/21/2023   Colon Cancer Screening  07/02/2026   DTaP/Tdap/Td vaccine (2 - Td or Tdap) 10/20/2031   Flu Shot  Completed   HIV Screening  Completed   Zoster (Shingles) Vaccine  Completed   HPV Vaccine  Aged Out  *Topic was postponed. The date shown is not the original due date.    Advanced directives: No  Conditions/risks identified: Yes  Next appointment: Follow up in one year for your annual wellness visit.  Preventive Care 40-64 Years, Male Preventive care refers to lifestyle choices and visits with your health care provider that can promote health and wellness. What does preventive care include? A yearly physical exam. This is also called an annual well check. Dental exams once or twice a year. Routine eye exams. Ask your health care provider how often you should have your eyes checked. Personal lifestyle choices, including: Daily care of your teeth and gums. Regular physical activity. Eating a healthy diet. Avoiding  tobacco and drug use. Limiting alcohol use. Practicing safe sex. Taking low-dose aspirin every day starting at age 86. What happens during an annual well check? The services and screenings done by your health care provider during your annual well check will depend on your age, overall health, lifestyle risk factors, and family history of disease. Counseling  Your health care provider may ask you questions about your: Alcohol use. Tobacco use. Drug use. Emotional well-being. Home and relationship well-being. Sexual activity. Eating habits. Work and work Statistician. Screening  You may have the following tests or measurements: Height, weight, and BMI. Blood pressure. Lipid and cholesterol levels. These may be checked every 5 years, or more frequently if you are over 15 years old. Skin check. Lung cancer screening. You may have this screening every year starting at age 109 if you have a 30-pack-year history of smoking and currently smoke or have quit within the past 15 years. Fecal occult blood test (FOBT) of the stool. You may have this test every year starting at age 66. Flexible sigmoidoscopy or colonoscopy. You may have a sigmoidoscopy every 5 years or a colonoscopy every 10 years starting at age 52. Prostate cancer screening. Recommendations will vary depending on your family history and other risks. Hepatitis C blood test. Hepatitis B blood test. Sexually transmitted disease (STD) testing. Diabetes screening. This is done by checking your blood sugar (glucose) after you have not eaten for a while (fasting).  You may have this done every 1-3 years. Discuss your test results, treatment options, and if necessary, the need for more tests with your health care provider. Vaccines  Your health care provider may recommend certain vaccines, such as: Influenza vaccine. This is recommended every year. Tetanus, diphtheria, and acellular pertussis (Tdap, Td) vaccine. You may need a Td booster  every 10 years. Zoster vaccine. You may need this after age 53. Pneumococcal 13-valent conjugate (PCV13) vaccine. You may need this if you have certain conditions and have not been vaccinated. Pneumococcal polysaccharide (PPSV23) vaccine. You may need one or two doses if you smoke cigarettes or if you have certain conditions. Talk to your health care provider about which screenings and vaccines you need and how often you need them. This information is not intended to replace advice given to you by your health care provider. Make sure you discuss any questions you have with your health care provider. Document Released: 07/25/2015 Document Revised: 03/17/2016 Document Reviewed: 04/29/2015 Elsevier Interactive Patient Education  2017 [place] Prevention in the Home Falls can cause injuries. They can happen to people of all ages. There are many things you can do to make your home safe and to help prevent falls. What can I do on the outside of my home? Regularly fix the edges of walkways and driveways and fix any cracks. Remove anything that might make you trip as you walk through a door, such as a raised step or threshold. Trim any bushes or trees on the path to your home. Use bright outdoor lighting. Clear any walking paths of anything that might make someone trip, such as rocks or tools. Regularly check to see if handrails are loose or broken. Make sure that both sides of any steps have handrails. Any raised decks and porches should have guardrails on the edges. Have any leaves, snow, or ice cleared regularly. Use sand or salt on walking paths during winter. Clean up any spills in your garage right away. This includes oil or grease spills. What can I do in the bathroom? Use night lights. Install grab bars by the toilet and in the tub and shower. Do not use towel bars as grab bars. Use non-skid mats or decals in the tub or shower. If you need to sit down in the shower, use a  plastic, non-slip stool. Keep the floor dry. Clean up any water that spills on the floor as soon as it happens. Remove soap buildup in the tub or shower regularly. Attach bath mats securely with double-sided non-slip rug tape. Do not have throw rugs and other things on the floor that can make you trip. What can I do in the bedroom? Use night lights. Make sure that you have a light by your bed that is easy to reach. Do not use any sheets or blankets that are too big for your bed. They should not hang down onto the floor. Have a firm chair that has side arms. You can use this for support while you get dressed. Do not have throw rugs and other things on the floor that can make you trip. What can I do in the kitchen? Clean up any spills right away. Avoid walking on wet floors. Keep items that you use a lot in easy-to-reach places. If you need to reach something above you, use a strong step stool that has a grab bar. Keep electrical cords out of the way. Do not use floor polish or wax that makes floors  slippery. If you must use wax, use non-skid floor wax. Do not have throw rugs and other things on the floor that can make you trip. What can I do with my stairs? Do not leave any items on the stairs. Make sure that there are handrails on both sides of the stairs and use them. Fix handrails that are broken or loose. Make sure that handrails are as long as the stairways. Check any carpeting to make sure that it is firmly attached to the stairs. Fix any carpet that is loose or worn. Avoid having throw rugs at the top or bottom of the stairs. If you do have throw rugs, attach them to the floor with carpet tape. Make sure that you have a light switch at the top of the stairs and the bottom of the stairs. If you do not have them, ask someone to add them for you. What else can I do to help prevent falls? Wear shoes that: Do not have high heels. Have rubber bottoms. Are comfortable and fit you  well. Are closed at the toe. Do not wear sandals. If you use a stepladder: Make sure that it is fully opened. Do not climb a closed stepladder. Make sure that both sides of the stepladder are locked into place. Ask someone to hold it for you, if possible. Clearly mark and make sure that you can see: Any grab bars or handrails. First and last steps. Where the edge of each step is. Use tools that help you move around (mobility aids) if they are needed. These include: Canes. Walkers. Scooters. Crutches. Turn on the lights when you go into a dark area. Replace any light bulbs as soon as they burn out. Set up your furniture so you have a clear path. Avoid moving your furniture around. If any of your floors are uneven, fix them. If there are any pets around you, be aware of where they are. Review your medicines with your doctor. Some medicines can make you feel dizzy. This can increase your chance of falling. Ask your doctor what other things that you can do to help prevent falls. This information is not intended to replace advice given to you by your health care provider. Make sure you discuss any questions you have with your health care provider. Document Released: 04/24/2009 Document Revised: 12/04/2015 Document Reviewed: 08/02/2014 Elsevier Interactive Patient Education  2017 [employer].

## 2022-09-21 NOTE — Progress Notes (Signed)
I connected with  [name] on 09/21/22 by a audio enabled telemedicine application and verified that I am speaking with the correct person using two identifiers.  Patient Location: Home  Provider Location: Office/Clinic  I discussed the limitations of evaluation and management by telemedicine. The patient expressed understanding and agreed to proceed.  Subjective:   [name] is a 57 y.o. male who presents for an Initial Medicare Annual Wellness Visit.  Review of Systems     Cardiac Risk Factors include: advanced age (>42mn, >>33women);hypertension;diabetes mellitus;dyslipidemia;family history of premature cardiovascular disease;male gender;obesity (BMI >30kg/m2)     Objective:    Today's Vitals   09/21/22 1031 09/21/22 1034  Weight: (!) 330 lb (149.7 kg)   Height: '6\' 1"'$  (1.854 m)   PainSc: 4  4   PainLoc: Knee    Body mass index is 43.54 kg/m.     09/21/2022   10:35 AM 09/15/2015    9:45 AM 09/05/2015   11:12 AM 03/31/2015    4:04 PM 08/12/2011    4:12 AM 08/11/2011   11:59 AM 08/06/2011   10:48 AM  Advanced Directives  Does Patient Have a Medical Advance Directive? No No No No Patient would like information;Patient does not have advance directive  Patient would not like information  Would patient like information on creating a medical advance directive? No - Patient declined No - patient declined information No - patient declined information  Advance directive packet given    Pre-existing out of facility DNR order (yellow form or pink MOST form)     No No     Current Medications (verified) Outpatient Encounter Medications as of 09/21/2022  Medication Sig   atorvastatin (LIPITOR) 20 MG tablet Take 20 mg by mouth daily.   buPROPion (WELLBUTRIN SR) 150 MG 12 hr tablet Take 150 mg by mouth daily.   busPIRone (BUSPAR) 10 MG tablet Take 10 mg by mouth 3 (three) times daily.   busPIRone (BUSPAR) 15 MG tablet Take 15 mg by mouth at bedtime.   cetirizine (ZYRTEC) 10 MG tablet  TAKE ONE TABLET BY MOUTH DAILY FOR ALLERGY   clobetasol (TEMOVATE) 0.05 % external solution    cromolyn (OPTICROM) 4 % ophthalmic solution INSTILL 1 DROP INTO EACH EYE 4 TIMES DAILY AT REGULAR INTERVALS.   cyclobenzaprine (FLEXERIL) 5 MG tablet Take 1 tablet (5 mg total) by mouth 3 (three) times daily as needed for muscle spasms.   diclofenac sodium (VOLTAREN) 1 % GEL Apply 4 g topically 4 (four) times daily as needed.   dicyclomine (BENTYL) 10 MG capsule    DULoxetine (CYMBALTA) 20 MG capsule Take 2 capsules by mouth 2 (two) times daily.   DULoxetine (CYMBALTA) 30 MG capsule Take 30 mg by mouth daily.   escitalopram (LEXAPRO) 20 MG tablet Take 1 tablet (20 mg total) by mouth daily.   fluticasone (FLONASE) 50 MCG/ACT nasal spray SPRAY 2 SPRAYS INTO EACH NOSTRIL EVERY DAY   gabapentin (NEURONTIN) 300 MG capsule Take one AM, one PM, two QHS   HYDROcodone-acetaminophen (NORCO/VICODIN) 5-325 MG tablet Take 1 tablet by mouth every 6 (six) hours as needed for moderate pain.   indomethacin (INDOCIN) 50 MG capsule TAKE ONE CAPSULE EVERY 6 HOURS AS NEEDED GOUT ONLY   lamoTRIgine (LAMICTAL) 25 MG tablet Take 2 tablets by mouth daily.   loperamide (IMODIUM) 2 MG capsule    losartan (COZAAR) 50 MG tablet TAKE ONE-HALF TABLET BY MOUTH DAILY FOR BLOOD PRESSURE (REPLACES OLMESARTAN)   meloxicam (MOBIC) 7.5 MG  tablet Take 7.5 mg by mouth daily.   metFORMIN (GLUCOPHAGE) 500 MG tablet TAKE 2 TABLETS BY MOUTH TWICE A DAY   metoprolol tartrate (LOPRESSOR) 25 MG tablet    olmesartan (BENICAR) 20 MG tablet TAKE 1 TABLET (20 MG TOTAL) BY MOUTH DAILY. ANNUAL APPT DUE IN SEPT MUST SEE PROVIDER FOR FUTURE REFILLS   omeprazole (PRILOSEC) 40 MG capsule TAKE 1 CAPSULE BY MOUTH EVERY DAY   ondansetron (ZOFRAN) 4 MG tablet Take 1 tablet (4 mg total) by mouth every 8 (eight) hours as needed for nausea or vomiting.   oxyCODONE-acetaminophen (PERCOCET) 10-325 MG tablet Take 1 tablet by mouth every 8 (eight) hours as needed for  pain.   prazosin (MINIPRESS) 2 MG capsule Take 1 capsule (2 mg total) by mouth at bedtime.   QUEtiapine (SEROQUEL) 25 MG tablet Take 25 mg by mouth at bedtime.   Semaglutide (RYBELSUS) 3 MG TABS Take 3 mg by mouth daily.   Semaglutide (RYBELSUS) 7 MG TABS Take 7 mg by mouth daily.   sildenafil (VIAGRA) 100 MG tablet Take by mouth.   testosterone (ANDRODERM) 4 MG/24HR PT24 patch Place 1 patch onto the skin daily.   traZODone (DESYREL) 50 MG tablet Take 1 tablet (50 mg total) by mouth at bedtime.   Vitamin D, Ergocalciferol, (DRISDOL) 1.25 MG (50000 UNIT) CAPS capsule Take 1 capsule (50,000 Units total) by mouth every 7 (seven) days.   XIIDRA 5 % SOLN INSTILL 1 DROP INTO BOTH EYES TWICE A DAY   No facility-administered encounter medications on file as of 09/21/2022.    Allergies (verified) Patient has no known allergies.   History: Past Medical History:  Diagnosis Date   Acid reflux    Alcohol abuse    Anxiety    Arthritis    Depression 07/08/2015   Diabetes mellitus ([place])    Diabetes mellitus, type II (HCC)    DJD (degenerative joint disease)    Gallbladder problem    Gallstones    GERD (gastroesophageal reflux disease)    Gout    HTN (hypertension)    Hx of adenomatous polyp of colon 07/13/2021   Hyperglycemia    Hyperlipidemia    IBS (irritable bowel syndrome)    Joint pain    Low testosterone in male    Male hypogonadism 07/08/2015   Obesity    Osteoarthritis    PONV (postoperative nausea and vomiting)    Sleep apnea    Past Surgical History:  Procedure Laterality Date   Back Cyst Excision     x2   CHOLECYSTECTOMY  08/11/2011   Procedure: LAPAROSCOPIC CHOLECYSTECTOMY WITH INTRAOPERATIVE CHOLANGIOGRAM;  Surgeon: Judieth Keens, DO;  Location: [facility] OR;  Service: General;  Laterality: N/A;  Laparoscopic cholecystectomy with cholangiogram.   COLONOSCOPY WITH PROPOFOL N/A 09/15/2015   Procedure: COLONOSCOPY WITH PROPOFOL;  Surgeon: Gatha Mayer, MD;  Location: [facility]  ENDOSCOPY;  Service: Endoscopy;  Laterality: N/A;   JOINT REPLACEMENT  01/26/2011   right knee   KNEE ARTHROSCOPY     x 2 left   KNEE ARTHROSCOPY     x 2 right   REPAIR KNEE LIGAMENT     Left   RIGHT FOOT     BONES STRAIGHTENED OUT   2006   Shoulder Surgery, Reconstruction of Joint     Left x 3, Last 02/2007   Tennis elbow surgery, Nerve entrapment     x2 right   Family History  Problem Relation Age of Onset   Sarcoidosis Mother  High Cholesterol Mother    Obesity Mother    Colon cancer Brother 36       half-brother   Ovarian cancer Maternal Grandmother    Cancer Neg Hx    Heart disease Neg Hx    Kidney disease Neg Hx    Colon polyps Neg Hx    Esophageal cancer Neg Hx    Stomach cancer Neg Hx    Rectal cancer Neg Hx    Social History   Socioeconomic History   Marital status: Married    Spouse name: Not on file   Number of children: 1   Years of education: Not on file   Highest education level: Not on file  Occupational History   Occupation: [employer]    Employer: [employer]  Tobacco Use   Smoking status: Never   Smokeless tobacco: Never  Vaping Use   Vaping Use: Never used  Substance and Sexual Activity   Alcohol use: No    Comment: socially=occasional    Drug use: No   Sexual activity: Yes    Birth control/protection: Condom  Other Topics Concern   Not on file  Social History Narrative   Works - Dance movement psychotherapist, some care through the [name]   Married, second time?     Daughter 62 - [employer] doing well. Has her own place   Occasional alcohol, non-smoker no drugs   Social Determinants of Health   Financial Resource Strain: Low Risk  (09/21/2022)   Overall Financial Resource Strain (CARDIA)    Difficulty of Paying Living Expenses: Not hard at all  Food Insecurity: No Food Insecurity (09/21/2022)   Hunger Vital Sign    Worried About Running Out of Food in the Last Year: Never true    Ran Out of Food in the Last Year: Never true   Transportation Needs: No Transportation Needs (09/21/2022)   PRAPARE - Hydrologist (Medical): No    Lack of Transportation (Non-Medical): No  Physical Activity: Sufficiently Active (09/21/2022)   Exercise Vital Sign    Days of Exercise per Week: 5 days    Minutes of Exercise per Session: 30 min  Stress: No Stress Concern Present (09/21/2022)   [place]    Feeling of Stress : Not at all  Social Connections: [place] (09/21/2022)   Social Connection and Isolation Panel [NHANES]    Frequency of Communication with Friends and Family: More than three times a week    Frequency of Social Gatherings with Friends and Family: More than three times a week    Attends Religious Services: More than 4 times per year    Active Member of [employer] or Organizations: Yes    Attends Music therapist: More than 4 times per year    Marital Status: Married    Tobacco Counseling Counseling given: Not Answered   Clinical Intake:  Pre-visit preparation completed: Yes  Pain : No/denies pain Pain Score: 4      BMI - recorded: 43.54 Nutritional Status: BMI > 30  Obese Nutritional Risks: None Diabetes: No  How often do you need to have someone help you when you read instructions, pamphlets, or other written materials from your doctor or pharmacy?: 1 - Never What is the last grade level you completed in school?: HSG  Nutrition Risk Assessment:  Has the patient had any N/V/D within the last 2 months?  No  Does the patient  have any non-healing wounds?  No  Has the patient had any unintentional weight loss or weight gain?  No   Diabetes:  Is the patient diabetic?  Yes  If diabetic, was a CBG obtained today?  No  Did the patient bring in their glucometer from home?  No  How often do you monitor your CBG's? no.   Financial Strains and Diabetes Management:  Are you having any  financial strains with the device, your supplies or your medication? No .  Does the patient want to be seen by Chronic Care Management for management of their diabetes?  No  Would the patient like to be referred to a Nutritionist or for Diabetic Management?  No   Diabetic Exams:  Diabetic Eye Exam: Completed with Tempie Hoist, MD.  Diabetic Foot Exam: Completed 01/06/2022   Interpreter Needed?: No  Information entered by :: Lisette Abu, LPN.   Activities of Daily Living    09/21/2022   10:42 AM  In your present state of health, do you have any difficulty performing the following activities:  Hearing? 1  Vision? 0  Difficulty concentrating or making decisions? 0  Walking or climbing stairs? 1  Dressing or bathing? 0  Doing errands, shopping? 0  Preparing Food and eating ? N  Using the Toilet? N  In the past six months, have you accidently leaked urine? N  Do you have problems with loss of bowel control? N  Managing your Medications? N  Managing your Finances? N  Housekeeping or managing your Housekeeping? N    Patient Care Team: Biagio Borg, MD as PCP - General (Internal Medicine) Ruby Cola, MD as Consulting Physician (Otolaryngology) [name], MD as Consulting Physician (Orthopedic Surgery) Madilyn Hook, DO (Inactive) as Consulting Physician (General Surgery) Stefanie Libel, MD as Consulting Physician (Sports Medicine) Hayden Pedro, MD as Consulting Physician (Ophthalmology) Katy Fitch, Darlina Guys, MD as Consulting Physician (Ophthalmology)  Indicate any recent Medical Services you may have received from other than [facility] providers in the past year (date may be approximate).     Assessment:   This is a routine wellness examination for Bryten.  Hearing/Vision screen Hearing Screening - Comments:: Patient has tinnitus; no hearing aids. Vision Screening - Comments:: Wears rx glasses - up to date with routine eye exams with [facility] and Tempie Hoist, MD. (Retinal/Diabetic Specialist)   Dietary issues and exercise activities discussed: Current Exercise Habits: Home exercise routine, Type of exercise: walking, Time (Minutes): 30, Frequency (Times/Week): 5, Weekly Exercise (Minutes/Week): 150, Intensity: Moderate, Exercise limited by: orthopedic condition(s)   Goals Addressed             This Visit's Progress    Client understands the importance of follow-up with providers by attending scheduled visits       Continue to stay active, maintain your health and engage in social activities.      Depression Screen    09/21/2022   10:41 AM 07/08/2022    9:38 AM 01/06/2022    9:41 AM 01/06/2022    9:06 AM 07/03/2021   10:18 AM 11/16/2019   10:33 AM 07/16/2019    9:38 AM  PHQ 2/9 Scores  PHQ - 2 Score 3 0 0 4 0 0 4  PHQ- 9 Score 3 0  14   9  Exception Documentation       Medical reason    Fall Risk    09/21/2022   10:36 AM 07/08/2022    9:38 AM 01/06/2022  9:41 AM 01/06/2022    9:06 AM 07/03/2021   10:18 AM  Fall Risk   Falls in the past year? 0 0 0 0 0  Number falls in past yr: 0 0 0 0 0  Injury with Fall? 0 0 0 0 0  Risk for fall due to : No Fall Risks No Fall Risks     Follow up Falls prevention discussed Falls evaluation completed       FALL RISK PREVENTION PERTAINING TO THE HOME:  Any stairs in or around the home? Yes  If so, are there any without handrails? No  Home free of loose throw rugs in walkways, pet beds, electrical cords, etc? Yes  Adequate lighting in your home to reduce risk of falls? Yes   ASSISTIVE DEVICES UTILIZED TO PREVENT FALLS:  Life alert? No  Use of a cane, walker or w/c? Yes  Grab bars in the bathroom? No  Shower chair or bench in shower? Yes  Elevated toilet seat or a handicapped toilet? Yes   TIMED UP AND GO:  Was the test performed? No . Telephonic Visit  Cognitive Function:        09/21/2022   10:37 AM  6CIT Screen  What Year? 0 points  What month? 0 points  What  time? 0 points  Count back from 20 0 points  Months in reverse 0 points  Repeat phrase 0 points  Total Score 0 points    Immunizations Immunization History  Administered Date(s) Administered   Influenza Split 03/12/2016   Influenza,inj,Quad PF,6+ Mos 07/08/2015, 03/15/2017, 09/29/2018, 03/23/2019, 05/15/2020   Influenza-Unspecified 08/13/2007, 06/12/2009, 04/11/2010, 05/27/2020, 03/31/2021, 06/02/2022   Moderna Sars-Covid-2 Vaccination 08/28/2019, 09/18/2019, 10/19/2019, 08/07/2020   Pneumococcal Conjugate-13 03/23/2019   Pneumococcal Polysaccharide-23 03/12/2016, 04/27/2016   Tdap 10/19/2021   Zoster Recombinat (Shingrix) 11/16/2019, 01/28/2020, 04/29/2020    TDAP status: Up to date  Flu Vaccine status: Up to date  Pneumococcal vaccine status: Up to date  Covid-19 vaccine status: Completed vaccines  Qualifies for Shingles Vaccine? Yes   Zostavax completed No   Shingrix Completed?: Yes  Screening Tests Health Maintenance  Topic Date Due   COVID-19 Vaccine (5 - 2023-24 season) 03/12/2022   OPHTHALMOLOGY EXAM  03/23/2022   Hepatitis C Screening  01/10/2023 (Originally 11/26/1983)   Diabetic kidney evaluation - Urine ACR  01/07/2023   FOOT EXAM  01/07/2023   HEMOGLOBIN A1C  01/07/2023   Diabetic kidney evaluation - eGFR measurement  07/09/2023   Medicare Annual Wellness (AWV)  09/21/2023   COLONOSCOPY (Pts 45-86yr Insurance coverage will need to be confirmed)  07/02/2026   DTaP/Tdap/Td (2 - Td or Tdap) 10/20/2031   INFLUENZA VACCINE  Completed   HIV Screening  Completed   Zoster Vaccines- Shingrix  Completed   HPV VACCINES  Aged Out    Health Maintenance  Health Maintenance Due  Topic Date Due   COVID-19 Vaccine (5 - 2023-24 season) 03/12/2022   OPHTHALMOLOGY EXAM  03/23/2022    Colorectal cancer screening: Type of screening: Colonoscopy. Completed 07/02/2021. Repeat every 5 years  Lung Cancer Screening: (Low Dose CT Chest recommended if Age 58-80years, 30  pack-year currently smoking OR have quit w/in 15years.) does not qualify.   Lung Cancer Screening Referral: no  Additional Screening:  Hepatitis C Screening: does qualify; Completed no  Vision Screening: Recommended annual ophthalmology exams for early detection of glaucoma and other disorders of the eye. Is the patient up to date with their annual eye exam?  Yes  Who is the provider or what is the name of the office in which the patient attends annual eye exams? Tempie Hoist, MD and [facility] If pt is not established with a provider, would they like to be referred to a provider to establish care? No .   Dental Screening: Recommended annual dental exams for proper oral hygiene  Community Resource Referral / Chronic Care Management: CRR required this visit?  No   CCM required this visit?  No      Plan:     I have personally reviewed and noted the following in the patient's chart:   Medical and social history Use of alcohol, tobacco or illicit drugs  Current medications and supplements including opioid prescriptions. Patient is currently taking opioid prescriptions. Information provided to patient regarding non-opioid alternatives. Patient advised to discuss non-opioid treatment plan with their provider. Functional ability and status Nutritional status Physical activity Advanced directives List of other physicians Hospitalizations, surgeries, and ER visits in previous 12 months Vitals Screenings to include cognitive, depression, and falls Referrals and appointments  In addition, I have reviewed and discussed with patient certain preventive protocols, quality metrics, and best practice recommendations. A written personalized care plan for preventive services as well as general preventive health recommendations were provided to patient.     Sheral Flow, LPN   [id number]   Nurse Notes: Normal cognitive status assessed by direct observation by this Nurse Health  Advisor. No abnormalities found.

## 2023-01-05 ENCOUNTER — Ambulatory Visit (INDEPENDENT_AMBULATORY_CARE_PROVIDER_SITE_OTHER): Payer: Medicare PPO | Admitting: Internal Medicine

## 2023-01-05 ENCOUNTER — Encounter: Payer: Self-pay | Admitting: Internal Medicine

## 2023-01-05 VITALS — BP 118/70 | HR 80 | Temp 98.3°F | Ht 73.0 in | Wt 319.0 lb

## 2023-01-05 DIAGNOSIS — E538 Deficiency of other specified B group vitamins: Secondary | ICD-10-CM

## 2023-01-05 DIAGNOSIS — Z1159 Encounter for screening for other viral diseases: Secondary | ICD-10-CM

## 2023-01-05 DIAGNOSIS — E559 Vitamin D deficiency, unspecified: Secondary | ICD-10-CM | POA: Diagnosis not present

## 2023-01-05 DIAGNOSIS — E291 Testicular hypofunction: Secondary | ICD-10-CM | POA: Diagnosis not present

## 2023-01-05 DIAGNOSIS — K116 Mucocele of salivary gland: Secondary | ICD-10-CM | POA: Diagnosis not present

## 2023-01-05 DIAGNOSIS — I10 Essential (primary) hypertension: Secondary | ICD-10-CM | POA: Diagnosis not present

## 2023-01-05 DIAGNOSIS — E7849 Other hyperlipidemia: Secondary | ICD-10-CM | POA: Diagnosis not present

## 2023-01-05 DIAGNOSIS — Z125 Encounter for screening for malignant neoplasm of prostate: Secondary | ICD-10-CM

## 2023-01-05 DIAGNOSIS — M25562 Pain in left knee: Secondary | ICD-10-CM

## 2023-01-05 DIAGNOSIS — Z0001 Encounter for general adult medical examination with abnormal findings: Secondary | ICD-10-CM | POA: Diagnosis not present

## 2023-01-05 DIAGNOSIS — Z7984 Long term (current) use of oral hypoglycemic drugs: Secondary | ICD-10-CM

## 2023-01-05 DIAGNOSIS — J019 Acute sinusitis, unspecified: Secondary | ICD-10-CM | POA: Insufficient documentation

## 2023-01-05 DIAGNOSIS — E1165 Type 2 diabetes mellitus with hyperglycemia: Secondary | ICD-10-CM

## 2023-01-05 DIAGNOSIS — H526 Other disorders of refraction: Secondary | ICD-10-CM | POA: Insufficient documentation

## 2023-01-05 LAB — URINALYSIS, ROUTINE W REFLEX MICROSCOPIC
Bilirubin Urine: NEGATIVE
Hgb urine dipstick: NEGATIVE
Ketones, ur: NEGATIVE
Leukocytes,Ua: NEGATIVE
Nitrite: NEGATIVE
RBC / HPF: NONE SEEN (ref 0–?)
Specific Gravity, Urine: 1.01 (ref 1.000–1.030)
Total Protein, Urine: NEGATIVE
Urine Glucose: >=1000 — AB
Urobilinogen, UA: 0.2 (ref 0.0–1.0)
pH: 6.5 (ref 5.0–8.0)

## 2023-01-05 LAB — BASIC METABOLIC PANEL
BUN: 21 mg/dL (ref 6–23)
CO2: 27 mEq/L (ref 19–32)
Calcium: 10 mg/dL (ref 8.4–10.5)
Chloride: 100 mEq/L (ref 96–112)
Creatinine, Ser: 1.04 mg/dL (ref 0.40–1.50)
GFR: 79.37 mL/min (ref >60.00)
Glucose, Bld: 81 mg/dL (ref 70–99)
Potassium: 4.1 mEq/L (ref 3.5–5.1)
Sodium: 138 mEq/L (ref 135–145)

## 2023-01-05 LAB — HEPATIC FUNCTION PANEL
ALT: 19 U/L (ref 0–53)
AST: 14 U/L (ref 0–37)
Albumin: 4.4 g/dL (ref 3.5–5.2)
Alkaline Phosphatase: 57 U/L (ref 39–117)
Bilirubin, Direct: 0.2 mg/dL (ref 0.0–0.3)
Total Bilirubin: 0.7 mg/dL (ref 0.2–1.2)
Total Protein: 7.5 g/dL (ref 6.0–8.3)

## 2023-01-05 LAB — CBC WITH DIFFERENTIAL/PLATELET
Basophils Absolute: 0 10*3/uL (ref 0.0–0.1)
Basophils Relative: 0.5 % (ref 0.0–3.0)
Eosinophils Absolute: 0.1 10*3/uL (ref 0.0–0.7)
Eosinophils Relative: 1.3 % (ref 0.0–5.0)
HCT: 45.6 % (ref 39.0–52.0)
Hemoglobin: 14.3 g/dL (ref 13.0–17.0)
Lymphocytes Relative: 30.2 % (ref 12.0–46.0)
Lymphs Abs: 1.7 10*3/uL (ref 0.7–4.0)
MCHC: 31.3 g/dL (ref 30.0–36.0)
MCV: 81.7 fl (ref 78.0–100.0)
Monocytes Absolute: 0.5 10*3/uL (ref 0.1–1.0)
Monocytes Relative: 9.7 % (ref 3.0–12.0)
Neutro Abs: 3.3 10*3/uL (ref 1.4–7.7)
Neutrophils Relative %: 58.3 % (ref 43.0–77.0)
Platelets: 190 10*3/uL (ref 150.0–400.0)
RBC: 5.58 Mil/uL (ref 4.22–5.81)
RDW: 14.8 % (ref 11.5–15.5)
WBC: 5.6 10*3/uL (ref 4.0–10.5)

## 2023-01-05 LAB — LIPID PANEL
Cholesterol: 109 mg/dL (ref 0–200)
HDL: 36.6 mg/dL — ABNORMAL LOW (ref >39.00)
LDL Cholesterol: 59 mg/dL (ref 0–99)
NonHDL: 71.92
Total CHOL/HDL Ratio: 3
Triglycerides: 63 mg/dL (ref 0.0–149.0)
VLDL: 12.6 mg/dL (ref 0.0–40.0)

## 2023-01-05 LAB — VITAMIN B12: Vitamin B-12: 689 pg/mL (ref 211–911)

## 2023-01-05 LAB — MICROALBUMIN / CREATININE URINE RATIO
Creatinine,U: 71.6 mg/dL
Microalb Creat Ratio: 1 mg/g (ref 0.0–30.0)
Microalb, Ur: <0.7 mg/dL (ref 0.0–1.9)

## 2023-01-05 LAB — PSA: PSA: 0.31 ng/mL (ref 0.10–4.00)

## 2023-01-05 LAB — TSH: TSH: 1.95 u[IU]/mL (ref 0.35–5.50)

## 2023-01-05 LAB — VITAMIN D 25 HYDROXY (VIT D DEFICIENCY, FRACTURES): VITD: 57.64 ng/mL (ref 30.00–100.00)

## 2023-01-05 LAB — HEMOGLOBIN A1C: Hgb A1c MFr Bld: 5.8 % (ref 4.6–6.5)

## 2023-01-05 LAB — TESTOSTERONE: Testosterone: 352.18 ng/dL (ref 300.00–890.00)

## 2023-01-05 MED ORDER — OZEMPIC (0.25 OR 0.5 MG/DOSE) 2 MG/3ML ~~LOC~~ SOPN: PEN_INJECTOR | SUBCUTANEOUS | 11 refills | Status: DC

## 2023-01-05 NOTE — Patient Instructions (Signed)
Please take all new medication as prescribed - the ozempic (and call in 1 month for the higher dose if you are taking this ok)  Please continue all other medications as before, and refills have been done if requested.  Please have the pharmacy call with any other refills you may need.  Please continue your efforts at being more active, low cholesterol diet, and weight control.  You are otherwise up to date with prevention measures today.  Please keep your appointments with your specialists as you may have planned  You will be contacted regarding the referral for: ENT - Dr Annalee Genta  Please go to the LAB at the blood drawing area for the tests to be done  You will be contacted by phone if any changes need to be made immediately.  Otherwise, you will receive a letter about your results with an explanation, but please check with MyChart first.  Please remember to sign up for MyChart if you have not done so, as this will be important to you in the future with finding out test results, communicating by private email, and scheduling acute appointments online when needed.  Please make an Appointment to return in 6 months, or sooner if needed

## 2023-01-05 NOTE — Progress Notes (Signed)
Patient ID: [name], male   DOB: 1965/12/12, 57 y.o.   MRN: [id number]         Chief Complaint:: wellness exam and dm, low testosterone, left parotid cyst, htn, hld, low b12       HPI:  [name] is a 57 y.o. male here for wellness exam; declines covid booster, o/w up to date                        Also Pt denies chest pain, increased sob or doe, wheezing, orthopnea, PND, increased LE swelling, palpitations, dizziness or syncope.   Pt denies polydipsia, polyuria, or new focal neuro s/s.    Pt denies fever, wt loss, night sweats, loss of appetite, or other constitutional symptoms    Lost wt so far with diet and activity, trying to get wt down for left knee surgury.  Rybelsus not working - not taking.  Has known left parotid cyst, and maybe enlarging, asks for ENT referral.  Has ongoing left knee arthritis, needs wt loss to get surgury.   Wt Readings from Last 3 Encounters:  01/05/23 (!) 319 lb (144.7 kg)  09/21/22 (!) 330 lb (149.7 kg)  07/08/22 (!) 357 lb (161.9 kg)   BP Readings from Last 3 Encounters:  01/05/23 118/70  07/29/22 126/78  07/08/22 120/80   Immunization History  Administered Date(s) Administered   Influenza Split 03/12/2016   Influenza,inj,Quad PF,6+ Mos 07/08/2015, 03/15/2017, 09/29/2018, 03/23/2019, 05/15/2020   Influenza-Unspecified 08/13/2007, 06/12/2009, 04/11/2010, 05/27/2020, 03/31/2021, 06/02/2022   Moderna Sars-Covid-2 Vaccination 08/28/2019, 09/18/2019, 10/19/2019, 08/07/2020   Pneumococcal Conjugate-13 03/23/2019   Pneumococcal Polysaccharide-23 03/12/2016, 04/27/2016   Tdap 10/19/2021   Zoster Recombinant(Shingrix) 11/16/2019, 01/28/2020, 04/29/2020   Health Maintenance Due  Topic Date Due   COVID-19 Vaccine (5 - 2023-24 season) 03/12/2022      Past Medical History:  Diagnosis Date   Acid reflux    Alcohol abuse    Anxiety    Arthritis    Depression 07/08/2015   Diabetes mellitus (HCC)    Diabetes mellitus, type II (HCC)    DJD (degenerative  joint disease)    Gallbladder problem    Gallstones    GERD (gastroesophageal reflux disease)    Gout    HTN (hypertension)    Hx of adenomatous polyp of colon 07/13/2021   Hyperglycemia    Hyperlipidemia    IBS (irritable bowel syndrome)    Joint pain    Low testosterone in male    Male hypogonadism 07/08/2015   Obesity    Osteoarthritis    PONV (postoperative nausea and vomiting)    Sleep apnea    Past Surgical History:  Procedure Laterality Date   Back Cyst Excision     x2   CHOLECYSTECTOMY  08/11/2011   Procedure: LAPAROSCOPIC CHOLECYSTECTOMY WITH INTRAOPERATIVE CHOLANGIOGRAM;  Surgeon: Rulon Abide, DO;  Location: [facility] OR;  Service: General;  Laterality: N/A;  Laparoscopic cholecystectomy with cholangiogram.   COLONOSCOPY WITH PROPOFOL N/A 09/15/2015   Procedure: COLONOSCOPY WITH PROPOFOL;  Surgeon: Iva Boop, MD;  Location: [facility];  Service: Endoscopy;  Laterality: N/A;   JOINT REPLACEMENT  01/26/2011   right knee   KNEE ARTHROSCOPY     x 2 left   KNEE ARTHROSCOPY     x 2 right   REPAIR KNEE LIGAMENT     Left   RIGHT FOOT     BONES STRAIGHTENED OUT   2006   Shoulder Surgery, Reconstruction of Joint  Left x 3, Last 02/2007   Tennis elbow surgery, Nerve entrapment     x2 right    reports that he has never smoked. He has never used smokeless tobacco. He reports that he does not drink alcohol and does not use drugs. family history includes Colon cancer (age of onset: 79) in his brother; High Cholesterol in his mother; Obesity in his mother; Ovarian cancer in his maternal grandmother; Sarcoidosis in his mother. No Known Allergies Current Outpatient Medications on File Prior to Visit  Medication Sig Dispense Refill   atorvastatin (LIPITOR) 20 MG tablet Take 20 mg by mouth daily.     buPROPion (WELLBUTRIN SR) 150 MG 12 hr tablet Take 150 mg by mouth daily.     busPIRone (BUSPAR) 10 MG tablet Take 10 mg by mouth 3 (three) times daily.     busPIRone (BUSPAR)  15 MG tablet Take 15 mg by mouth at bedtime.     clobetasol (TEMOVATE) 0.05 % external solution      cromolyn (OPTICROM) 4 % ophthalmic solution INSTILL 1 DROP INTO EACH EYE 4 TIMES DAILY AT REGULAR INTERVALS.     cyclobenzaprine (FLEXERIL) 5 MG tablet Take 1 tablet (5 mg total) by mouth 3 (three) times daily as needed for muscle spasms. 40 tablet 5   diclofenac sodium (VOLTAREN) 1 % GEL Apply 4 g topically 4 (four) times daily as needed. 400 g 1   dicyclomine (BENTYL) 10 MG capsule      DULoxetine (CYMBALTA) 30 MG capsule Take 30 mg by mouth daily.     fluticasone (FLONASE) 50 MCG/ACT nasal spray SPRAY 2 SPRAYS INTO EACH NOSTRIL EVERY DAY 48 mL 2   gabapentin (NEURONTIN) 300 MG capsule Take one AM, one PM, two QHS 240 capsule 2   indomethacin (INDOCIN) 50 MG capsule TAKE ONE CAPSULE EVERY 6 HOURS AS NEEDED GOUT ONLY 60 capsule 2   loperamide (IMODIUM) 2 MG capsule      losartan (COZAAR) 50 MG tablet TAKE ONE-HALF TABLET BY MOUTH DAILY FOR BLOOD PRESSURE (REPLACES OLMESARTAN)     meloxicam (MOBIC) 7.5 MG tablet Take 7.5 mg by mouth daily.     metFORMIN (GLUCOPHAGE) 500 MG tablet TAKE 2 TABLETS BY MOUTH TWICE A DAY 360 tablet 1   metoprolol tartrate (LOPRESSOR) 25 MG tablet      olmesartan (BENICAR) 20 MG tablet TAKE 1 TABLET (20 MG TOTAL) BY MOUTH DAILY. ANNUAL APPT DUE IN SEPT MUST SEE PROVIDER FOR FUTURE REFILLS 90 tablet 1   omeprazole (PRILOSEC) 40 MG capsule TAKE 1 CAPSULE BY MOUTH EVERY DAY 90 capsule 2   ondansetron (ZOFRAN) 4 MG tablet Take 1 tablet (4 mg total) by mouth every 8 (eight) hours as needed for nausea or vomiting. 20 tablet 0   prazosin (MINIPRESS) 2 MG capsule Take 1 capsule (2 mg total) by mouth at bedtime. 30 capsule 1   QUEtiapine (SEROQUEL) 25 MG tablet Take 25 mg by mouth at bedtime.     sildenafil (VIAGRA) 100 MG tablet Take by mouth.     testosterone (ANDRODERM) 4 MG/24HR PT24 patch Place 1 patch onto the skin daily. 30 patch 2   traZODone (DESYREL) 50 MG tablet Take  1 tablet (50 mg total) by mouth at bedtime. 30 tablet 1   Vitamin D, Ergocalciferol, (DRISDOL) 1.25 MG (50000 UNIT) CAPS capsule Take 1 capsule (50,000 Units total) by mouth every 7 (seven) days. 4 capsule 0   XIIDRA 5 % SOLN INSTILL 1 DROP INTO BOTH EYES TWICE A  DAY     cetirizine (ZYRTEC) 10 MG tablet TAKE ONE TABLET BY MOUTH DAILY FOR ALLERGY     DULoxetine (CYMBALTA) 20 MG capsule Take 2 capsules by mouth 2 (two) times daily.     escitalopram (LEXAPRO) 20 MG tablet Take 1 tablet (20 mg total) by mouth daily. 30 tablet 2   lamoTRIgine (LAMICTAL) 25 MG tablet Take 2 tablets by mouth daily.     No current facility-administered medications on file prior to visit.        ROS:  All others reviewed and negative.  Objective        PE:  BP 118/70 (BP Location: Right Arm, Patient Position: Sitting, Cuff Size: Normal)   Pulse 80   Temp 98.3 F (36.8 C) (Oral)   Ht 6\' 1"  (1.854 m)   Wt (!) 319 lb (144.7 kg)   SpO2 96%   BMI 42.09 kg/m                 Constitutional: Pt appears in NAD               HENT: Head: NCAT.                Right Ear: External ear normal.                 Left Ear: External ear normal.                Eyes: . Pupils are equal, round, and reactive to light. Conjunctivae and EOM are normal               Nose: without d/c or deformity               Neck: Neck supple. Gross normal ROM               Cardiovascular: Normal rate and regular rhythm.                 Pulmonary/Chest: Effort normal and breath sounds without rales or wheezing.                Abd:  Soft, NT, ND, + BS, no organomegaly               Neurological: Pt is alert. At baseline orientation, motor grossly intact               Skin: Skin is warm. No rashes, no other new lesions, LE edema - none               Psychiatric: Pt behavior is normal without agitation   Micro: none  Cardiac tracings I have personally interpreted today:  none  Pertinent Radiological findings (summarize): none   Lab Results   Component Value Date   WBC 5.6 01/05/2023   HGB 14.3 01/05/2023   HCT 45.6 01/05/2023   PLT 190.0 01/05/2023   GLUCOSE 81 01/05/2023   CHOL 109 01/05/2023   TRIG 63.0 01/05/2023   HDL 36.60 (L) 01/05/2023   LDLCALC 59 01/05/2023   ALT 19 01/05/2023   AST 14 01/05/2023   NA 138 01/05/2023   K 4.1 01/05/2023   CL 100 01/05/2023   CREATININE 1.04 01/05/2023   BUN 21 01/05/2023   CO2 27 01/05/2023   TSH 1.95 01/05/2023   PSA 0.31 01/05/2023   INR 0.96 05/14/2011   HGBA1C 5.8 01/05/2023   MICROALBUR <0.7 01/05/2023   Assessment/Plan:  [name] is a 57 y.o. Black or African  American [2] male with  has a past medical history of Acid reflux, Alcohol abuse, Anxiety, Arthritis, Depression (07/08/2015), Diabetes mellitus (HCC), Diabetes mellitus, type II (HCC), DJD (degenerative joint disease), Gallbladder problem, Gallstones, GERD (gastroesophageal reflux disease), Gout, HTN (hypertension), adenomatous polyp of colon (07/13/2021), Hyperglycemia, Hyperlipidemia, IBS (irritable bowel syndrome), Joint pain, Low testosterone in male, Male hypogonadism (07/08/2015), Obesity, Osteoarthritis, PONV (postoperative nausea and vomiting), and Sleep apnea.  Left knee pain Cont mobic prn, hopefully for left knee TKR soon with further wt loss  Encounter for well adult exam with abnormal findings Age and sex appropriate education and counseling updated with regular exercise and diet Referrals for preventative services - declines covid booster Immunizations addressed - none needed Smoking counseling  - none needed Evidence for depression or other mood disorder - none significant Most recent labs reviewed. I have personally reviewed and have noted: 1) the patient's medical and social history 2) The patient's current medications and supplements 3) The patient's height, weight, and BMI have been recorded in the chart   Hyperlipidemia Lab Results  Component Value Date   LDLCALC 59 01/05/2023    Stable, pt to continue current statin lipitor 20 mg   Essential hypertension BP Readings from Last 3 Encounters:  01/05/23 118/70  07/29/22 126/78  07/08/22 120/80   Stable, pt to continue medical treatment losartan 50 mg every day, lopressor 25 mg, benicar 20 qd   Diabetes (HCC) Lab Results  Component Value Date   HGBA1C 5.8 01/05/2023   Stable, pt to continue current medical treatment metformin 1000 bid, and change rybelsus to ozempic 0.25 weekly   Male hypogonadism Also for testosterone level  Parotid cyst For ENT referral  Vitamin B 12 deficiency Lab Results  Component Value Date   VITAMINB12 689 01/05/2023   Stable, cont oral replacement - b12 1000 mcg qd   Morbid obesity (HCC) For ozempic as above  Followup: Return in about 6 months (around 07/07/2023).  Oliver Barre, MD 01/08/2023 7:18 PM [facility]

## 2023-01-05 NOTE — Assessment & Plan Note (Signed)
Cont mobic prn, hopefully for left knee TKR soon with further wt loss

## 2023-01-05 NOTE — Progress Notes (Signed)
The test results show that your current treatment is OK, as the tests are stable.  Please continue the same plan.  There is no other need for change of treatment or further evaluation based on these results, at this time.  thanks 

## 2023-01-06 LAB — HEPATITIS C ANTIBODY: Hepatitis C Ab: NONREACTIVE

## 2023-01-07 ENCOUNTER — Telehealth: Payer: Self-pay | Admitting: Internal Medicine

## 2023-01-07 NOTE — Telephone Encounter (Signed)
Ok this is ordered 

## 2023-01-07 NOTE — Telephone Encounter (Signed)
Clydie Braun from [facility] said patient is having knee surgery soon and is having trouble getting around. She wanted to know if Dr. Jonny Ruiz would recommend a home healthcare aid for prior to his surgery. Best callback for Clydie Braun is [phone], extension [phone](secure).

## 2023-01-08 ENCOUNTER — Encounter: Payer: Self-pay | Admitting: Internal Medicine

## 2023-01-08 NOTE — Assessment & Plan Note (Signed)
Lab Results  Component Value Date   HGBA1C 5.8 01/05/2023   Stable, pt to continue current medical treatment metformin 1000 bid, and change rybelsus to ozempic 0.25 weekly

## 2023-01-08 NOTE — Assessment & Plan Note (Signed)
Lab Results  Component Value Date   VITAMINB12 689 01/05/2023   Stable, cont oral replacement - b12 1000 mcg qd

## 2023-01-08 NOTE — Assessment & Plan Note (Signed)
For ENT referral 

## 2023-01-08 NOTE — Assessment & Plan Note (Signed)
For ozempic as above °

## 2023-01-08 NOTE — Assessment & Plan Note (Signed)
Also for testosterone level 

## 2023-01-08 NOTE — Assessment & Plan Note (Signed)
Lab Results  Component Value Date   LDLCALC 59 01/05/2023   Stable, pt to continue current statin lipitor 20 mg

## 2023-01-08 NOTE — Assessment & Plan Note (Signed)
BP Readings from Last 3 Encounters:  01/05/23 118/70  07/29/22 126/78  07/08/22 120/80   Stable, pt to continue medical treatment losartan 50 mg every day, lopressor 25 mg, benicar 20 qd

## 2023-01-08 NOTE — Assessment & Plan Note (Signed)
Age and sex appropriate education and counseling updated with regular exercise and diet Referrals for preventative services - declines covid booster Immunizations addressed - none needed Smoking counseling  - none needed Evidence for depression or other mood disorder - none significant Most recent labs reviewed. I have personally reviewed and have noted: 1) the patient's medical and social history 2) The patient's current medications and supplements 3) The patient's height, weight, and BMI have been recorded in the chart

## 2023-01-10 ENCOUNTER — Other Ambulatory Visit: Payer: Self-pay | Admitting: Internal Medicine

## 2023-01-10 DIAGNOSIS — I10 Essential (primary) hypertension: Secondary | ICD-10-CM

## 2023-01-10 DIAGNOSIS — E1165 Type 2 diabetes mellitus with hyperglycemia: Secondary | ICD-10-CM

## 2023-01-10 DIAGNOSIS — M25562 Pain in left knee: Secondary | ICD-10-CM

## 2023-01-10 DIAGNOSIS — R269 Unspecified abnormalities of gait and mobility: Secondary | ICD-10-CM

## 2023-01-10 NOTE — Telephone Encounter (Signed)
Done today.

## 2023-03-07 DIAGNOSIS — M1712 Unilateral primary osteoarthritis, left knee: Secondary | ICD-10-CM | POA: Diagnosis not present

## 2023-03-08 DIAGNOSIS — K116 Mucocele of salivary gland: Secondary | ICD-10-CM | POA: Diagnosis not present

## 2023-03-08 DIAGNOSIS — K219 Gastro-esophageal reflux disease without esophagitis: Secondary | ICD-10-CM | POA: Diagnosis not present

## 2023-07-01 DIAGNOSIS — Z6841 Body Mass Index (BMI) 40.0 and over, adult: Secondary | ICD-10-CM | POA: Diagnosis not present

## 2023-07-01 DIAGNOSIS — M1712 Unilateral primary osteoarthritis, left knee: Secondary | ICD-10-CM | POA: Diagnosis not present

## 2023-07-15 ENCOUNTER — Encounter (INDEPENDENT_AMBULATORY_CARE_PROVIDER_SITE_OTHER): Payer: Medicare PPO | Admitting: Ophthalmology

## 2023-07-15 DIAGNOSIS — H43813 Vitreous degeneration, bilateral: Secondary | ICD-10-CM | POA: Diagnosis not present

## 2023-07-15 DIAGNOSIS — H35033 Hypertensive retinopathy, bilateral: Secondary | ICD-10-CM | POA: Diagnosis not present

## 2023-07-15 DIAGNOSIS — E113293 Type 2 diabetes mellitus with mild nonproliferative diabetic retinopathy without macular edema, bilateral: Secondary | ICD-10-CM | POA: Diagnosis not present

## 2023-07-15 DIAGNOSIS — Z7984 Long term (current) use of oral hypoglycemic drugs: Secondary | ICD-10-CM

## 2023-07-15 DIAGNOSIS — I10 Essential (primary) hypertension: Secondary | ICD-10-CM | POA: Diagnosis not present

## 2023-07-28 DIAGNOSIS — H40013 Open angle with borderline findings, low risk, bilateral: Secondary | ICD-10-CM | POA: Diagnosis not present

## 2023-07-28 DIAGNOSIS — H35373 Puckering of macula, bilateral: Secondary | ICD-10-CM | POA: Diagnosis not present

## 2023-07-28 DIAGNOSIS — H43813 Vitreous degeneration, bilateral: Secondary | ICD-10-CM | POA: Diagnosis not present

## 2023-07-28 DIAGNOSIS — H2511 Age-related nuclear cataract, right eye: Secondary | ICD-10-CM | POA: Diagnosis not present

## 2023-07-28 DIAGNOSIS — E119 Type 2 diabetes mellitus without complications: Secondary | ICD-10-CM | POA: Diagnosis not present

## 2023-08-19 ENCOUNTER — Encounter: Payer: Self-pay | Admitting: Internal Medicine

## 2023-08-19 ENCOUNTER — Ambulatory Visit (INDEPENDENT_AMBULATORY_CARE_PROVIDER_SITE_OTHER): Payer: Medicare PPO | Admitting: Internal Medicine

## 2023-08-19 VITALS — BP 126/80 | HR 80 | Temp 98.2°F | Ht 73.0 in | Wt 319.0 lb

## 2023-08-19 DIAGNOSIS — E291 Testicular hypofunction: Secondary | ICD-10-CM | POA: Diagnosis not present

## 2023-08-19 DIAGNOSIS — E7849 Other hyperlipidemia: Secondary | ICD-10-CM

## 2023-08-19 DIAGNOSIS — E538 Deficiency of other specified B group vitamins: Secondary | ICD-10-CM

## 2023-08-19 DIAGNOSIS — R2689 Other abnormalities of gait and mobility: Secondary | ICD-10-CM | POA: Insufficient documentation

## 2023-08-19 DIAGNOSIS — Z125 Encounter for screening for malignant neoplasm of prostate: Secondary | ICD-10-CM | POA: Diagnosis not present

## 2023-08-19 DIAGNOSIS — Z7985 Long-term (current) use of injectable non-insulin antidiabetic drugs: Secondary | ICD-10-CM

## 2023-08-19 DIAGNOSIS — Z0000 Encounter for general adult medical examination without abnormal findings: Secondary | ICD-10-CM | POA: Diagnosis not present

## 2023-08-19 DIAGNOSIS — I10 Essential (primary) hypertension: Secondary | ICD-10-CM | POA: Diagnosis not present

## 2023-08-19 DIAGNOSIS — M170 Bilateral primary osteoarthritis of knee: Secondary | ICD-10-CM | POA: Insufficient documentation

## 2023-08-19 DIAGNOSIS — E1165 Type 2 diabetes mellitus with hyperglycemia: Secondary | ICD-10-CM

## 2023-08-19 LAB — TSH: TSH: 1.47 u[IU]/mL (ref 0.35–5.50)

## 2023-08-19 LAB — URINALYSIS, ROUTINE W REFLEX MICROSCOPIC
Bilirubin Urine: NEGATIVE
Hgb urine dipstick: NEGATIVE
Ketones, ur: NEGATIVE
Leukocytes,Ua: NEGATIVE
Nitrite: NEGATIVE
RBC / HPF: NONE SEEN (ref 0–?)
Specific Gravity, Urine: 1.02 (ref 1.000–1.030)
Total Protein, Urine: NEGATIVE
Urine Glucose: >=1000 — AB
Urobilinogen, UA: 0.2 (ref 0.0–1.0)
WBC, UA: NONE SEEN (ref 0–?)
pH: 6 (ref 5.0–8.0)

## 2023-08-19 LAB — LIPID PANEL
Cholesterol: 100 mg/dL (ref 0–200)
HDL: 42.8 mg/dL (ref >39.00)
LDL Cholesterol: 48 mg/dL (ref 0–99)
NonHDL: 57.39
Total CHOL/HDL Ratio: 2
Triglycerides: 47 mg/dL (ref 0.0–149.0)
VLDL: 9.4 mg/dL (ref 0.0–40.0)

## 2023-08-19 LAB — CBC WITH DIFFERENTIAL/PLATELET
Basophils Absolute: 0 10*3/uL (ref 0.0–0.1)
Basophils Relative: 0.4 % (ref 0.0–3.0)
Eosinophils Absolute: 0 10*3/uL (ref 0.0–0.7)
Eosinophils Relative: 0.7 % (ref 0.0–5.0)
HCT: 47.5 % (ref 39.0–52.0)
Hemoglobin: 15.1 g/dL (ref 13.0–17.0)
Lymphocytes Relative: 26.1 % (ref 12.0–46.0)
Lymphs Abs: 1.6 10*3/uL (ref 0.7–4.0)
MCHC: 31.7 g/dL (ref 30.0–36.0)
MCV: 83.6 fL (ref 78.0–100.0)
Monocytes Absolute: 0.5 10*3/uL (ref 0.1–1.0)
Monocytes Relative: 8.1 % (ref 3.0–12.0)
Neutro Abs: 4 10*3/uL (ref 1.4–7.7)
Neutrophils Relative %: 64.7 % (ref 43.0–77.0)
Platelets: 175 10*3/uL (ref 150.0–400.0)
RBC: 5.67 Mil/uL (ref 4.22–5.81)
RDW: 14.7 % (ref 11.5–15.5)
WBC: 6.1 10*3/uL (ref 4.0–10.5)

## 2023-08-19 LAB — HEMOGLOBIN A1C: Hgb A1c MFr Bld: 5.7 % (ref 4.6–6.5)

## 2023-08-19 LAB — BASIC METABOLIC PANEL
BUN: 8 mg/dL (ref 6–23)
CO2: 27 meq/L (ref 19–32)
Calcium: 9.2 mg/dL (ref 8.4–10.5)
Chloride: 104 meq/L (ref 96–112)
Creatinine, Ser: 0.99 mg/dL (ref 0.40–1.50)
GFR: 83.84 mL/min (ref >60.00)
Glucose, Bld: 79 mg/dL (ref 70–99)
Potassium: 3.9 meq/L (ref 3.5–5.1)
Sodium: 142 meq/L (ref 135–145)

## 2023-08-19 LAB — HEPATIC FUNCTION PANEL
ALT: 15 U/L (ref 0–53)
AST: 13 U/L (ref 0–37)
Albumin: 4.2 g/dL (ref 3.5–5.2)
Alkaline Phosphatase: 54 U/L (ref 39–117)
Bilirubin, Direct: 0.3 mg/dL (ref 0.0–0.3)
Total Bilirubin: 1.4 mg/dL — ABNORMAL HIGH (ref 0.2–1.2)
Total Protein: 7 g/dL (ref 6.0–8.3)

## 2023-08-19 LAB — TESTOSTERONE: Testosterone: 420.56 ng/dL (ref 300.00–890.00)

## 2023-08-19 LAB — PSA: PSA: 0.29 ng/mL (ref 0.10–4.00)

## 2023-08-19 MED ORDER — SEMAGLUTIDE(0.25 OR 0.5MG/DOS) 2 MG/3ML ~~LOC~~ SOPN: PEN_INJECTOR | SUBCUTANEOUS | Status: DC

## 2023-08-19 NOTE — Assessment & Plan Note (Addendum)
 Unfortunatley no wt loss yet per [facility] ozempic  0.5mg  - may want to change to [facility], pt will consider when f/u there  Lab Results  Component Value Date   WBC 6.1 08/19/2023   HGB 15.1 08/19/2023   HCT 47.5 08/19/2023   PLT 175.0 08/19/2023   GLUCOSE 79 08/19/2023   CHOL 100 08/19/2023   TRIG 47.0 08/19/2023   HDL 42.80 08/19/2023   LDLCALC 48 08/19/2023   ALT 15 08/19/2023   AST 13 08/19/2023   NA 142 08/19/2023   K 3.9 08/19/2023   CL 104 08/19/2023   CREATININE 0.99 08/19/2023   BUN 8 08/19/2023   CO2 27 08/19/2023   TSH 1.47 08/19/2023   PSA 0.29 08/19/2023   INR 0.96 05/14/2011   HGBA1C 5.7 08/19/2023   MICROALBUR <0.7 01/05/2023

## 2023-08-19 NOTE — Progress Notes (Signed)
 The test results show that your current treatment is OK, as the tests are stable.  Please continue the same plan.  There is no other need for change of treatment or further evaluation based on these results, at this time.  thanks

## 2023-08-19 NOTE — Progress Notes (Signed)
 Patient ID: [name], male   DOB: 19-Sep-1965, 58 y.o.   MRN: [id number]         Chief Complaint:: wellness exam and dm, htn, hld, hypogonadism, low b12       HPI:  [name] is a 58 y.o. male here for wellness exam; decliens covid booster, pt will call for eye exam soon, o/w up to date                        Also for cataract surgury feb 29.  Needs to get to 300 lbs for eligible for right knee TKR per Dr Ernie. Getting PT twice per wk pre surgury  Pt denies chest pain, increased sob or doe, wheezing, orthopnea, PND, increased LE swelling, palpitations, dizziness or syncope.   Pt denies polydipsia, polyuria, or new focal neuro s/s.    Pt denies fever, wt loss, night sweats, loss of appetite, or other constitutional symptoms   Wt Readings from Last 3 Encounters:  08/19/23 (!) 319 lb (144.7 kg)  01/05/23 (!) 319 lb (144.7 kg)  09/21/22 (!) 330 lb (149.7 kg)   BP Readings from Last 3 Encounters:  08/19/23 126/80  01/05/23 118/70  07/29/22 126/78   Immunization History  Administered Date(s) Administered   Influenza Split 03/12/2016   Influenza, Seasonal, Injecte, Preservative Fre 05/02/2023   Influenza,inj,Quad PF,6+ Mos 07/08/2015, 03/15/2017, 09/29/2018, 03/23/2019, 05/15/2020   Influenza-Unspecified 08/13/2007, 06/12/2009, 04/11/2010, 05/27/2020, 03/31/2021, 06/02/2022   Moderna Sars-Covid-2 Vaccination 08/28/2019, 09/18/2019, 10/19/2019, 08/07/2020   Pneumococcal Conjugate-13 03/23/2019   Pneumococcal Polysaccharide-23 03/12/2016, 04/27/2016   Tdap 10/19/2021   Zoster Recombinant(Shingrix) 11/16/2019, 01/28/2020, 04/29/2020   Health Maintenance Due  Topic Date Due   COVID-19 Vaccine (5 - 2024-25 season) 03/13/2023   OPHTHALMOLOGY EXAM  07/27/2023   Medicare Annual Wellness (AWV)  09/21/2023      Past Medical History:  Diagnosis Date   Acid reflux    Alcohol abuse    Anxiety    Arthritis    Depression 07/08/2015   Diabetes mellitus (HCC)    Diabetes mellitus, type II  (HCC)    DJD (degenerative joint disease)    Gallbladder problem    Gallstones    GERD (gastroesophageal reflux disease)    Gout    HTN (hypertension)    Hx of adenomatous polyp of colon 07/13/2021   Hyperglycemia    Hyperlipidemia    IBS (irritable bowel syndrome)    Joint pain    Low testosterone  in male    Male hypogonadism 07/08/2015   Obesity    Osteoarthritis    PONV (postoperative nausea and vomiting)    Sleep apnea    Past Surgical History:  Procedure Laterality Date   Back Cyst Excision     x2   CHOLECYSTECTOMY  08/11/2011   Procedure: LAPAROSCOPIC CHOLECYSTECTOMY WITH INTRAOPERATIVE CHOLANGIOGRAM;  Surgeon: Redell Alm Faith, DO;  Location: [facility] OR;  Service: General;  Laterality: N/A;  Laparoscopic cholecystectomy with cholangiogram.   COLONOSCOPY WITH PROPOFOL  N/A 09/15/2015   Procedure: COLONOSCOPY WITH PROPOFOL ;  Surgeon: Lupita FORBES Commander, MD;  Location: [facility];  Service: Endoscopy;  Laterality: N/A;   JOINT REPLACEMENT  01/26/2011   right knee   KNEE ARTHROSCOPY     x 2 left   KNEE ARTHROSCOPY     x 2 right   REPAIR KNEE LIGAMENT     Left   RIGHT FOOT     BONES STRAIGHTENED OUT   2006   Shoulder Surgery, Reconstruction  of Joint     Left x 3, Last 02/2007   Tennis elbow surgery, Nerve entrapment     x2 right    reports that he has never smoked. He has never used smokeless tobacco. He reports that he does not drink alcohol and does not use drugs. family history includes Colon cancer (age of onset: 3) in his brother; High Cholesterol in his mother; Obesity in his mother; Ovarian cancer in his maternal grandmother; Sarcoidosis in his mother. No Known Allergies Current Outpatient Medications on File Prior to Visit  Medication Sig Dispense Refill   atorvastatin (LIPITOR) 20 MG tablet Take 20 mg by mouth daily.     buPROPion (WELLBUTRIN SR) 150 MG 12 hr tablet Take 150 mg by mouth daily.     busPIRone (BUSPAR) 10 MG tablet Take 10 mg by mouth 3 (three) times  daily.     busPIRone (BUSPAR) 15 MG tablet Take 15 mg by mouth at bedtime.     clobetasol (TEMOVATE) 0.05 % external solution      cromolyn (OPTICROM) 4 % ophthalmic solution INSTILL 1 DROP INTO EACH EYE 4 TIMES DAILY AT REGULAR INTERVALS.     cyclobenzaprine  (FLEXERIL ) 5 MG tablet Take 1 tablet (5 mg total) by mouth 3 (three) times daily as needed for muscle spasms. 40 tablet 5   diclofenac  sodium (VOLTAREN ) 1 % GEL Apply 4 g topically 4 (four) times daily as needed. 400 g 1   dicyclomine (BENTYL) 10 MG capsule      DULoxetine (CYMBALTA) 30 MG capsule Take 30 mg by mouth daily.     fluticasone  (FLONASE ) 50 MCG/ACT nasal spray SPRAY 2 SPRAYS INTO EACH NOSTRIL EVERY DAY 48 mL 2   gabapentin  (NEURONTIN ) 300 MG capsule Take one AM, one PM, two QHS 240 capsule 2   indomethacin  (INDOCIN ) 50 MG capsule TAKE ONE CAPSULE EVERY 6 HOURS AS NEEDED GOUT ONLY 60 capsule 2   loperamide (IMODIUM) 2 MG capsule      losartan (COZAAR) 50 MG tablet TAKE ONE-HALF TABLET BY MOUTH DAILY FOR BLOOD PRESSURE (REPLACES OLMESARTAN )     meloxicam  (MOBIC ) 7.5 MG tablet Take 7.5 mg by mouth daily.     metFORMIN  (GLUCOPHAGE ) 500 MG tablet TAKE 2 TABLETS BY MOUTH TWICE A DAY 360 tablet 1   metoprolol tartrate (LOPRESSOR) 25 MG tablet      olmesartan  (BENICAR ) 20 MG tablet TAKE 1 TABLET (20 MG TOTAL) BY MOUTH DAILY. ANNUAL APPT DUE IN SEPT MUST SEE PROVIDER FOR FUTURE REFILLS 90 tablet 1   omeprazole  (PRILOSEC) 40 MG capsule TAKE 1 CAPSULE BY MOUTH EVERY DAY 90 capsule 2   ondansetron  (ZOFRAN ) 4 MG tablet Take 1 tablet (4 mg total) by mouth every 8 (eight) hours as needed for nausea or vomiting. 20 tablet 0   prazosin  (MINIPRESS ) 2 MG capsule Take 1 capsule (2 mg total) by mouth at bedtime. 30 capsule 1   QUEtiapine (SEROQUEL) 25 MG tablet Take 25 mg by mouth at bedtime.     sildenafil  (VIAGRA ) 100 MG tablet Take by mouth.     testosterone  (ANDRODERM ) 4 MG/24HR PT24 patch Place 1 patch onto the skin daily. 30 patch 2    traZODone  (DESYREL ) 50 MG tablet Take 1 tablet (50 mg total) by mouth at bedtime. 30 tablet 1   Vitamin D , Ergocalciferol , (DRISDOL ) 1.25 MG (50000 UNIT) CAPS capsule Take 1 capsule (50,000 Units total) by mouth every 7 (seven) days. 4 capsule 0   XIIDRA 5 % SOLN INSTILL 1  DROP INTO BOTH EYES TWICE A DAY     cetirizine (ZYRTEC) 10 MG tablet TAKE ONE TABLET BY MOUTH DAILY FOR ALLERGY     DULoxetine (CYMBALTA) 20 MG capsule Take 2 capsules by mouth 2 (two) times daily.     escitalopram  (LEXAPRO ) 20 MG tablet Take 1 tablet (20 mg total) by mouth daily. 30 tablet 2   lamoTRIgine (LAMICTAL) 25 MG tablet Take 2 tablets by mouth daily.     No current facility-administered medications on file prior to visit.        ROS:  All others reviewed and negative.  Objective        PE:  BP 126/80 (BP Location: Right Arm, Patient Position: Sitting, Cuff Size: Normal)   Pulse 80   Temp 98.2 F (36.8 C) (Oral)   Ht 6' 1 (1.854 m)   Wt (!) 319 lb (144.7 kg)   SpO2 98%   BMI 42.09 kg/m                 Constitutional: Pt appears in NAD               HENT: Head: NCAT.                Right Ear: External ear normal.                 Left Ear: External ear normal.                Eyes: . Pupils are equal, round, and reactive to light. Conjunctivae and EOM are normal               Nose: without d/c or deformity               Neck: Neck supple. Gross normal ROM               Cardiovascular: Normal rate and regular rhythm.                 Pulmonary/Chest: Effort normal and breath sounds without rales or wheezing.                Abd:  Soft, NT, ND, + BS, no organomegaly               Neurological: Pt is alert. At baseline orientation, motor grossly intact               Skin: Skin is warm. No rashes, no other new lesions, LE edema - none               Psychiatric: Pt behavior is normal without agitation   Micro: none  Cardiac tracings I have personally interpreted today:  none  Pertinent Radiological  findings (summarize): none   Lab Results  Component Value Date   WBC 6.1 08/19/2023   HGB 15.1 08/19/2023   HCT 47.5 08/19/2023   PLT 175.0 08/19/2023   GLUCOSE 79 08/19/2023   CHOL 100 08/19/2023   TRIG 47.0 08/19/2023   HDL 42.80 08/19/2023   LDLCALC 48 08/19/2023   ALT 15 08/19/2023   AST 13 08/19/2023   NA 142 08/19/2023   K 3.9 08/19/2023   CL 104 08/19/2023   CREATININE 0.99 08/19/2023   BUN 8 08/19/2023   CO2 27 08/19/2023   TSH 1.47 08/19/2023   PSA 0.29 08/19/2023   INR 0.96 05/14/2011   HGBA1C 5.7 08/19/2023   MICROALBUR <0.7 01/05/2023   Assessment/Plan:  [name] is a  58 y.o. Black or African American [2] male with  has a past medical history of Acid reflux, Alcohol abuse, Anxiety, Arthritis, Depression (07/08/2015), Diabetes mellitus (HCC), Diabetes mellitus, type II (HCC), DJD (degenerative joint disease), Gallbladder problem, Gallstones, GERD (gastroesophageal reflux disease), Gout, HTN (hypertension), adenomatous polyp of colon (07/13/2021), Hyperglycemia, Hyperlipidemia, IBS (irritable bowel syndrome), Joint pain, Low testosterone  in male, Male hypogonadism (07/08/2015), Obesity, Osteoarthritis, PONV (postoperative nausea and vomiting), and Sleep apnea.  Diabetes (HCC) Unfortunatley no [facility]  [facility]  - may want to change to [facility], pt will consider when f/u there  Lab Results  Component Value Date   WBC 6.1 08/19/2023   HGB 15.1 08/19/2023   HCT 47.5 08/19/2023   PLT 175.0 08/19/2023   GLUCOSE 79 08/19/2023   CHOL 100 08/19/2023   TRIG 47.0 08/19/2023   HDL 42.80 08/19/2023   LDLCALC 48 08/19/2023   ALT 15 08/19/2023   AST 13 08/19/2023   NA 142 08/19/2023   K 3.9 08/19/2023   CL 104 08/19/2023   CREATININE 0.99 08/19/2023   BUN 8 08/19/2023   CO2 27 08/19/2023   TSH 1.47 08/19/2023   PSA 0.29 08/19/2023   INR 0.96 05/14/2011   HGBA1C 5.7 08/19/2023   MICROALBUR <0.7 01/05/2023     Preventative health care Age and sex  appropriate education and counseling updated with regular exercise and diet Referrals for preventative services - none needed Immunizations addressed - declines covid booster Smoking counseling  - none needed Evidence for depression or other mood disorder - none significant Most recent labs reviewed. I have personally reviewed and have noted: 1) the patient's medical and social history 2) The patient's current medications and supplements 3) The patient's height, weight, and BMI have been recorded in the chart   Essential hypertension BP Readings from Last 3 Encounters:  08/19/23 126/80  01/05/23 118/70  07/29/22 126/78   Stable, pt to continue medical treatment losartan 50 every day, lopressor 25 mg, benicar  20 qd   Hyperlipidemia Lab Results  Component Value Date   LDLCALC 48 08/19/2023   Stable, pt to continue current statin lipitor 20 qd   Male hypogonadism Also for f/u testosterone  lab  Vitamin B 12 deficiency Lab Results  Component Value Date   VITAMINB12 689 01/05/2023   Stable, cont oral replacement - b12 1000 mcg qd  Followup: Return in about 6 months (around 02/16/2024).  Lynwood Rush, MD 08/21/2023 9:14 PM [facility]

## 2023-08-19 NOTE — Patient Instructions (Signed)

## 2023-08-21 ENCOUNTER — Encounter: Payer: Self-pay | Admitting: Internal Medicine

## 2023-08-21 NOTE — Assessment & Plan Note (Signed)
Lab Results  Component Value Date   VITAMINB12 689 01/05/2023   Stable, cont oral replacement - b12 1000 mcg qd

## 2023-08-21 NOTE — Assessment & Plan Note (Signed)
 BP Readings from Last 3 Encounters:  08/19/23 126/80  01/05/23 118/70  07/29/22 126/78   Stable, pt to continue medical treatment losartan 50 every day, lopressor 25 mg, benicar  20 qd

## 2023-08-21 NOTE — Assessment & Plan Note (Signed)

## 2023-08-21 NOTE — Assessment & Plan Note (Signed)
 Also for f/u testosterone  lab

## 2023-08-21 NOTE — Assessment & Plan Note (Signed)
 Lab Results  Component Value Date   LDLCALC 48 08/19/2023   Stable, pt to continue current statin lipitor 20 qd

## 2023-09-22 ENCOUNTER — Ambulatory Visit (INDEPENDENT_AMBULATORY_CARE_PROVIDER_SITE_OTHER): Payer: Medicare PPO

## 2023-09-22 VITALS — Ht 73.0 in | Wt 319.0 lb

## 2023-09-22 DIAGNOSIS — Z0000 Encounter for general adult medical examination without abnormal findings: Secondary | ICD-10-CM | POA: Diagnosis not present

## 2023-09-22 NOTE — Progress Notes (Signed)
 Subjective:   [name] is a 58 y.o. who presents for a Medicare Wellness preventive visit.  Visit Complete: Virtual I connected with  [name] on 09/22/23 by a audio enabled telemedicine application and verified that I am speaking with the correct person using two identifiers.  Patient Location: Home  Provider Location: Office/Clinic  I discussed the limitations of evaluation and management by telemedicine. The patient expressed understanding and agreed to proceed.  Vital Signs: Because this visit was a virtual/telehealth visit, some criteria may be missing or patient reported. Any vitals not documented were not able to be obtained and vitals that have been documented are patient reported.  VideoDeclined- This patient declined Librarian, academic. Therefore the visit was completed with audio only.  Persons Participating in Visit: Patient.  AWV Questionnaire: Yes: Patient Medicare AWV questionnaire was completed by the patient on 09/18/2023; I have confirmed that all information answered by patient is correct and no changes since this date.  Cardiac Risk Factors include: male gender;diabetes mellitus;dyslipidemia;advanced age (>29men, >64 women);hypertension     Objective:    Today's Vitals   09/22/23 1048  Weight: (!) 319 lb (144.7 kg)  Height: 6\' 1"  (1.854 m)   Body mass index is 42.09 kg/m.     09/22/2023   10:43 AM 09/21/2022   10:35 AM 09/15/2015    9:45 AM 09/05/2015   11:12 AM 03/31/2015    4:04 PM 08/12/2011    4:12 AM 08/11/2011   11:59 AM  Advanced Directives  Does Patient Have a Medical Advance Directive? Yes No No No No Patient would like information;Patient does not have advance directive   Type of Advance Directive Healthcare Power of [place];Living will        Copy of Healthcare Power of Attorney in Chart? No - copy requested        Would patient like information on creating a medical advance directive?  No - Patient declined No -  patient declined information No - patient declined information  Advance directive packet given   Pre-existing out of facility DNR order (yellow form or pink MOST form)      No No    Current Medications (verified) Outpatient Encounter Medications as of 09/22/2023  Medication Sig   atorvastatin (LIPITOR) 20 MG tablet Take 20 mg by mouth daily.   busPIRone (BUSPAR) 15 MG tablet Take 15 mg by mouth at bedtime.   clobetasol (TEMOVATE) 0.05 % external solution    cromolyn (OPTICROM) 4 % ophthalmic solution INSTILL 1 DROP INTO EACH EYE 4 TIMES DAILY AT REGULAR INTERVALS.   cyclobenzaprine (FLEXERIL) 5 MG tablet Take 1 tablet (5 mg total) by mouth 3 (three) times daily as needed for muscle spasms.   diclofenac sodium (VOLTAREN) 1 % GEL Apply 4 g topically 4 (four) times daily as needed.   dicyclomine (BENTYL) 10 MG capsule    DULoxetine (CYMBALTA) 30 MG capsule Take 30 mg by mouth daily.   fluticasone (FLONASE) 50 MCG/ACT nasal spray SPRAY 2 SPRAYS INTO EACH NOSTRIL EVERY DAY   gabapentin (NEURONTIN) 300 MG capsule Take one AM, one PM, two QHS   indomethacin (INDOCIN) 50 MG capsule TAKE ONE CAPSULE EVERY 6 HOURS AS NEEDED GOUT ONLY   loperamide (IMODIUM) 2 MG capsule    losartan (COZAAR) 50 MG tablet TAKE ONE-HALF TABLET BY MOUTH DAILY FOR BLOOD PRESSURE (REPLACES OLMESARTAN)   meloxicam (MOBIC) 7.5 MG tablet Take 7.5 mg by mouth daily.   metFORMIN (GLUCOPHAGE) 500 MG tablet TAKE  2 TABLETS BY MOUTH TWICE A DAY   metoprolol tartrate (LOPRESSOR) 25 MG tablet    olmesartan (BENICAR) 20 MG tablet TAKE 1 TABLET (20 MG TOTAL) BY MOUTH DAILY. ANNUAL APPT DUE IN SEPT MUST SEE PROVIDER FOR FUTURE REFILLS   omeprazole (PRILOSEC) 40 MG capsule TAKE 1 CAPSULE BY MOUTH EVERY DAY   ondansetron (ZOFRAN) 4 MG tablet Take 1 tablet (4 mg total) by mouth every 8 (eight) hours as needed for nausea or vomiting.   prazosin (MINIPRESS) 2 MG capsule Take 1 capsule (2 mg total) by mouth at bedtime.   QUEtiapine (SEROQUEL)  25 MG tablet Take 25 mg by mouth at bedtime.   Semaglutide,0.25 or 0.5MG /DOS, 2 MG/3ML SOPN Take 0.5 mg wubq once weekly per [facility]   sildenafil (VIAGRA) 100 MG tablet Take by mouth.   testosterone (ANDRODERM) 4 MG/24HR PT24 patch Place 1 patch onto the skin daily.   traZODone (DESYREL) 50 MG tablet Take 1 tablet (50 mg total) by mouth at bedtime.   Vitamin D, Ergocalciferol, (DRISDOL) 1.25 MG (50000 UNIT) CAPS capsule Take 1 capsule (50,000 Units total) by mouth every 7 (seven) days.   XIIDRA 5 % SOLN INSTILL 1 DROP INTO BOTH EYES TWICE A DAY   [DISCONTINUED] buPROPion (WELLBUTRIN SR) 150 MG 12 hr tablet Take 150 mg by mouth daily.   [DISCONTINUED] busPIRone (BUSPAR) 10 MG tablet Take 10 mg by mouth 3 (three) times daily.   cetirizine (ZYRTEC) 10 MG tablet TAKE ONE TABLET BY MOUTH DAILY FOR ALLERGY   lamoTRIgine (LAMICTAL) 25 MG tablet Take 2 tablets by mouth daily.   [DISCONTINUED] DULoxetine (CYMBALTA) 20 MG capsule Take 2 capsules by mouth 2 (two) times daily.   [DISCONTINUED] escitalopram (LEXAPRO) 20 MG tablet Take 1 tablet (20 mg total) by mouth daily.   No facility-administered encounter medications on file as of 09/22/2023.    Allergies (verified) Patient has no known allergies.   History: Past Medical History:  Diagnosis Date   Acid reflux    Alcohol abuse    Anxiety    Arthritis    Depression 07/08/2015   Diabetes mellitus (HCC)    Diabetes mellitus, type II (HCC)    DJD (degenerative joint disease)    Gallbladder problem    Gallstones    GERD (gastroesophageal reflux disease)    Gout    HTN (hypertension)    Hx of adenomatous polyp of colon 07/13/2021   Hyperglycemia    Hyperlipidemia    IBS (irritable bowel syndrome)    Joint pain    Low testosterone in male    Male hypogonadism 07/08/2015   Obesity    Osteoarthritis    PONV (postoperative nausea and vomiting)    Sleep apnea    Past Surgical History:  Procedure Laterality Date   Back Cyst Excision     x2    CHOLECYSTECTOMY  08/11/2011   Procedure: LAPAROSCOPIC CHOLECYSTECTOMY WITH INTRAOPERATIVE CHOLANGIOGRAM;  Surgeon: Rulon Abide, DO;  Location: [facility] OR;  Service: General;  Laterality: N/A;  Laparoscopic cholecystectomy with cholangiogram.   COLONOSCOPY WITH PROPOFOL N/A 09/15/2015   Procedure: COLONOSCOPY WITH PROPOFOL;  Surgeon: Iva Boop, MD;  Location: [facility];  Service: Endoscopy;  Laterality: N/A;   JOINT REPLACEMENT  01/26/2011   right knee   KNEE ARTHROSCOPY     x 2 left   KNEE ARTHROSCOPY     x 2 right   REPAIR KNEE LIGAMENT     Left   RIGHT FOOT  BONES STRAIGHTENED OUT   2006   Shoulder Surgery, Reconstruction of Joint     Left x 3, Last 02/2007   Tennis elbow surgery, Nerve entrapment     x2 right   Family History  Problem Relation Age of Onset   Sarcoidosis Mother    High Cholesterol Mother    Obesity Mother    Colon cancer Brother 56       half-brother   Ovarian cancer Maternal Grandmother    Cancer Neg Hx    Heart disease Neg Hx    Kidney disease Neg Hx    Colon polyps Neg Hx    Esophageal cancer Neg Hx    Stomach cancer Neg Hx    Rectal cancer Neg Hx    Social History   Socioeconomic History   Marital status: Single    Spouse name: Not on file   Number of children: 1   Years of education: Not on file   Highest education level: 12th grade  Occupational History   Occupation: Film/video editor Cells    Employer: [employer]  Tobacco Use   Smoking status: Never    Passive exposure: Never   Smokeless tobacco: Never  Vaping Use   Vaping status: Never Used  Substance and Sexual Activity   Alcohol use: No    Comment: socially=occasional    Drug use: No   Sexual activity: Yes    Birth control/protection: Condom  Other Topics Concern   Not on file  Social History Narrative   Works - Armed forces training and education officer, some care through the [place]   Single     Daughter 1988 - [employer] doing well. Has her own place   Occasional alcohol, non-smoker no drugs    Social Drivers of Corporate investment banker Strain: Low Risk  (09/22/2023)   Overall Financial Resource Strain (CARDIA)    Difficulty of Paying Living Expenses: Not hard at all  Recent Concern: Financial Resource Strain - High Risk (08/15/2023)   Overall Financial Resource Strain (CARDIA)    Difficulty of Paying Living Expenses: Very hard  Food Insecurity: No Food Insecurity (09/22/2023)   Hunger Vital Sign    Worried About Running Out of Food in the Last Year: Never true    Ran Out of Food in the Last Year: Never true  Recent Concern: Food Insecurity - Food Insecurity Present (08/15/2023)   Hunger Vital Sign    Worried About Running Out of Food in the Last Year: Often true    Ran Out of Food in the Last Year: Often true  Transportation Needs: No Transportation Needs (09/22/2023)   PRAPARE - Administrator, Civil Service (Medical): No    Lack of Transportation (Non-Medical): No  Physical Activity: Insufficiently Active (09/22/2023)   Exercise Vital Sign    Days of Exercise per Week: 2 days    Minutes of Exercise per Session: 60 min  Stress: No Stress Concern Present (09/22/2023)   [employer] of Occupational Health - Occupational Stress Questionnaire    Feeling of Stress : Not at all  Recent Concern: Stress - Stress Concern Present (08/15/2023)   [employer] of Occupational Health - Occupational Stress Questionnaire    Feeling of Stress : Rather much  Social Connections: Socially Isolated (09/22/2023)   Social Connection and Isolation Panel [NHANES]    Frequency of Communication with Friends and Family: More than three times a week    Frequency of Social Gatherings with Friends and Family: Once a  week    Attends Religious Services: Never    Active Member of Clubs or Organizations: No    Attends Engineer, structural: Never    Marital Status: Divorced    Tobacco Counseling - Non Smoker Counseling given: No    Clinical Intake:  Pre-visit  preparation completed: Yes  Pain : No/denies pain     BMI - recorded: 42.09 Nutritional Status: BMI > 30  Obese Diabetes: Yes CBG done?: No Did pt. bring in CBG monitor from home?: No  How often do you need to have someone help you when you read instructions, pamphlets, or other written materials from your doctor or pharmacy?: 1 - Never  Interpreter Needed?: No  Information entered by :: Hassell Halim, CMA   Activities of Daily Living     09/22/2023   11:04 AM 09/18/2023    8:42 AM  In your present state of health, do you have any difficulty performing the following activities:  Hearing? 0 0  Vision? 0 0  Difficulty concentrating or making decisions? 0 0  Walking or climbing stairs? 0 0  Dressing or bathing? 0 0  Doing errands, shopping? 0 0  Preparing Food and eating ? N N  Using the Toilet? N N  In the past six months, have you accidently leaked urine? N N  Do you have problems with loss of bowel control? N N  Managing your Medications? N N  Managing your Finances? N N  Housekeeping or managing your Housekeeping? N N    Patient Care Team: Corwin Levins, MD as PCP - General (Internal Medicine) Melvenia Beam, MD as Consulting Physician (Otolaryngology) Durene Romans, MD as Consulting Physician (Orthopedic Surgery) Lodema Pilot, DO (Inactive) as Consulting Physician (General Surgery) Enid Baas, MD as Consulting Physician (Sports Medicine) Sherrie George, MD as Consulting Physician (Ophthalmology) Dione Booze, Bertram Millard, MD as Consulting Physician (Ophthalmology)  Indicate any recent Medical Services you may have received from other than [facility] providers in the past year (date may be approximate).     Assessment:   This is a routine wellness examination for Marquice.  Hearing/Vision screen Hearing Screening - Comments:: Denies hearing difficulties   Vision Screening - Comments:: Wears rx glasses - up to date with routine eye exams with Dr Ernesto Rutherford   Goals  Addressed               This Visit's Progress     Weight (lb) < 200 lb (90.7 kg) (pt-stated)   319 lb (144.7 kg)     Patient stated he'd like to lose about 100lbs.         Depression Screen     09/22/2023   10:54 AM 08/19/2023    8:23 AM 01/05/2023    8:35 AM 09/21/2022   10:41 AM 07/08/2022    9:38 AM 01/06/2022    9:41 AM 01/06/2022    9:06 AM  PHQ 2/9 Scores  PHQ - 2 Score 0 0 0 3 0 0 4  PHQ- 9 Score 0   3 0  14    Fall Risk     09/22/2023   10:55 AM 09/18/2023    8:42 AM 08/19/2023    8:27 AM 01/05/2023    8:35 AM 09/21/2022   10:36 AM  Fall Risk   Falls in the past year? 0 1 0 0 0  Number falls in past yr: 0  0 0 0  Injury with Fall? 0 0 0 0 0  Risk  for fall due to :   No Fall Risks No Fall Risks No Fall Risks  Follow up Falls evaluation completed;Falls prevention discussed  Falls evaluation completed Falls evaluation completed Falls prevention discussed    MEDICARE RISK AT HOME:  Medicare Risk at Home Any stairs in or around the home?: Yes If so, are there any without handrails?: No Home free of loose throw rugs in walkways, pet beds, electrical cords, etc?: Yes Adequate lighting in your home to reduce risk of falls?: Yes Life alert?: No Use of a cane, walker or w/c?: Yes (cane - as needed) Grab bars in the bathroom?: Yes Shower chair or bench in shower?: Yes Elevated toilet seat or a handicapped toilet?: No  TIMED UP AND GO:  Was the test performed?  No  Cognitive Function: 6CIT completed        09/22/2023   10:56 AM 09/21/2022   10:37 AM  6CIT Screen  What Year? 0 points 0 points  What month? 0 points 0 points  What time? 0 points 0 points  Count back from 20 0 points 0 points  Months in reverse 0 points 0 points  Repeat phrase 2 points 0 points  Total Score 2 points 0 points    Immunizations Immunization History  Administered Date(s) Administered   Influenza Split 03/12/2016   Influenza, Seasonal, Injecte, Preservative Fre 05/02/2023    Influenza,inj,Quad PF,6+ Mos 07/08/2015, 03/15/2017, 09/29/2018, 03/23/2019, 05/15/2020   Influenza-Unspecified 08/13/2007, 06/12/2009, 04/11/2010, 05/27/2020, 03/31/2021, 06/02/2022   Moderna Sars-Covid-2 Vaccination 08/28/2019, 09/18/2019, 10/19/2019, 08/07/2020   Pneumococcal Conjugate-13 03/23/2019   Pneumococcal Polysaccharide-23 03/12/2016, 04/27/2016   Tdap 10/19/2021   Zoster Recombinant(Shingrix) 11/16/2019, 01/28/2020, 04/29/2020    Screening Tests Health Maintenance  Topic Date Due   COVID-19 Vaccine (5 - 2024-25 season) 03/13/2023   OPHTHALMOLOGY EXAM  07/27/2023   Diabetic kidney evaluation - Urine ACR  01/05/2024   FOOT EXAM  01/05/2024   HEMOGLOBIN A1C  02/16/2024   Diabetic kidney evaluation - eGFR measurement  08/18/2024   Medicare Annual Wellness (AWV)  09/21/2024   Colonoscopy  07/02/2026   Pneumococcal Vaccine 39-58 Years old (3 of 3 - PPSV23 or PCV20) 11/25/2029   DTaP/Tdap/Td (2 - Td or Tdap) 10/20/2031   INFLUENZA VACCINE  Completed   Hepatitis C Screening  Completed   HIV Screening  Completed   Zoster Vaccines- Shingrix  Completed   HPV VACCINES  Aged Out    Health Maintenance  Health Maintenance Due  Topic Date Due   COVID-19 Vaccine (5 - 2024-25 season) 03/13/2023   OPHTHALMOLOGY EXAM  07/27/2023   Health Maintenance Items Addressed: 09/22/2023   Additional Screening:  Vision Screening: Recommended annual ophthalmology exams for early detection of glaucoma and other disorders of the eye.   Dental Screening: Recommended annual dental exams for proper oral hygiene  Community Resource Referral / Chronic Care Management: CRR required this visit?  No   CCM required this visit?  No     Plan:     I have personally reviewed and noted the following in the patient's chart:   Medical and social history Use of alcohol, tobacco or illicit drugs  Current medications and supplements including opioid prescriptions. Patient is not currently taking  opioid prescriptions. Functional ability and status Nutritional status Physical activity Advanced directives List of other physicians Hospitalizations, surgeries, and ER visits in previous 12 months Vitals Screenings to include cognitive, depression, and falls Referrals and appointments  In addition, I have reviewed and discussed with patient certain  preventive protocols, quality metrics, and best practice recommendations. A written personalized care plan for preventive services as well as general preventive health recommendations were provided to patient.     Darreld Mclean, CMA   09/22/2023   After Visit Summary: (MyChart) Due to this being a telephonic visit, the after visit summary with patients personalized plan was offered to patient via MyChart   Notes: Nothing significant to report at this time.

## 2023-09-22 NOTE — Patient Instructions (Addendum)
 [name] , Thank you for taking time to come for your Medicare Wellness Visit. I appreciate your ongoing commitment to your health goals. Please review the following plan we discussed and let me know if I can assist you in the future.   Referrals/Orders/Follow-Ups/Clinician Recommendations: Aim for 30 minutes of exercise or brisk walking, 6-8 glasses of water, and 5 servings of fruits and vegetables each day.   This is a list of the screening recommended for you and due dates:  Health Maintenance  Topic Date Due   COVID-19 Vaccine (5 - 2024-25 season) 03/13/2023   Eye exam for diabetics  07/27/2023   Yearly kidney health urinalysis for diabetes  01/05/2024   Complete foot exam   01/05/2024   Hemoglobin A1C  02/16/2024   Yearly kidney function blood test for diabetes  08/18/2024   Medicare Annual Wellness Visit  09/21/2024   Colon Cancer Screening  07/02/2026   Pneumococcal Vaccination (3 of 3 - PPSV23 or PCV20) 11/25/2029   DTaP/Tdap/Td vaccine (2 - Td or Tdap) 10/20/2031   Flu Shot  Completed   Hepatitis C Screening  Completed   HIV Screening  Completed   Zoster (Shingles) Vaccine  Completed   HPV Vaccine  Aged Out    Advanced directives: (Copy Requested) Please bring a copy of your health care power of attorney and living will to the office to be added to your chart at your convenience. You can mail to [facility] [address] [place], [place] [address] or email to [email or web address]  Next Medicare Annual Wellness Visit scheduled for next year: Yes

## 2023-10-04 ENCOUNTER — Ambulatory Visit: Admitting: Podiatry

## 2023-10-13 ENCOUNTER — Ambulatory Visit: Admitting: Podiatry

## 2023-11-04 ENCOUNTER — Encounter (INDEPENDENT_AMBULATORY_CARE_PROVIDER_SITE_OTHER): Admitting: Ophthalmology

## 2023-11-04 DIAGNOSIS — I10 Essential (primary) hypertension: Secondary | ICD-10-CM

## 2023-11-04 DIAGNOSIS — H43813 Vitreous degeneration, bilateral: Secondary | ICD-10-CM | POA: Diagnosis not present

## 2023-11-04 DIAGNOSIS — H35033 Hypertensive retinopathy, bilateral: Secondary | ICD-10-CM | POA: Diagnosis not present

## 2023-11-04 DIAGNOSIS — Z7984 Long term (current) use of oral hypoglycemic drugs: Secondary | ICD-10-CM | POA: Diagnosis not present

## 2023-11-04 DIAGNOSIS — E113293 Type 2 diabetes mellitus with mild nonproliferative diabetic retinopathy without macular edema, bilateral: Secondary | ICD-10-CM | POA: Diagnosis not present

## 2023-11-10 DIAGNOSIS — M1712 Unilateral primary osteoarthritis, left knee: Secondary | ICD-10-CM | POA: Diagnosis not present

## 2023-11-23 ENCOUNTER — Telehealth: Payer: Self-pay | Admitting: *Deleted

## 2023-11-23 NOTE — Transitions of Care (Post Inpatient/ED Visit) (Signed)
   11/23/2023  Name: [name] MRN: [id number] DOB: 1966-05-22  RNCM contacted patient for [facility] outreach. Name and DOB verified. Patient denies recent admission. RNCM apologized for any inconvenience.  Arna Better RN, BSN   [facility] RN Care Manager [phone]

## 2024-01-13 DIAGNOSIS — K219 Gastro-esophageal reflux disease without esophagitis: Secondary | ICD-10-CM | POA: Diagnosis not present

## 2024-01-13 DIAGNOSIS — Z79899 Other long term (current) drug therapy: Secondary | ICD-10-CM | POA: Diagnosis not present

## 2024-01-13 DIAGNOSIS — E119 Type 2 diabetes mellitus without complications: Secondary | ICD-10-CM | POA: Diagnosis not present

## 2024-01-13 DIAGNOSIS — Z791 Long term (current) use of non-steroidal anti-inflammatories (NSAID): Secondary | ICD-10-CM | POA: Diagnosis not present

## 2024-01-13 DIAGNOSIS — L089 Local infection of the skin and subcutaneous tissue, unspecified: Secondary | ICD-10-CM | POA: Diagnosis not present

## 2024-01-13 DIAGNOSIS — L723 Sebaceous cyst: Secondary | ICD-10-CM | POA: Diagnosis not present

## 2024-01-13 DIAGNOSIS — I10 Essential (primary) hypertension: Secondary | ICD-10-CM | POA: Diagnosis not present

## 2024-01-13 DIAGNOSIS — Z7984 Long term (current) use of oral hypoglycemic drugs: Secondary | ICD-10-CM | POA: Diagnosis not present

## 2024-01-17 ENCOUNTER — Ambulatory Visit: Admitting: Internal Medicine

## 2024-02-10 ENCOUNTER — Ambulatory Visit (INDEPENDENT_AMBULATORY_CARE_PROVIDER_SITE_OTHER): Admitting: Internal Medicine

## 2024-02-10 ENCOUNTER — Encounter: Payer: Self-pay | Admitting: Internal Medicine

## 2024-02-10 VITALS — BP 100/70 | HR 84 | Temp 98.1°F | Ht 73.0 in | Wt 313.0 lb

## 2024-02-10 DIAGNOSIS — Z7984 Long term (current) use of oral hypoglycemic drugs: Secondary | ICD-10-CM | POA: Diagnosis not present

## 2024-02-10 DIAGNOSIS — E538 Deficiency of other specified B group vitamins: Secondary | ICD-10-CM | POA: Diagnosis not present

## 2024-02-10 DIAGNOSIS — E1165 Type 2 diabetes mellitus with hyperglycemia: Secondary | ICD-10-CM

## 2024-02-10 DIAGNOSIS — I10 Essential (primary) hypertension: Secondary | ICD-10-CM | POA: Diagnosis not present

## 2024-02-10 DIAGNOSIS — Z7985 Long-term (current) use of injectable non-insulin antidiabetic drugs: Secondary | ICD-10-CM

## 2024-02-10 DIAGNOSIS — L0291 Cutaneous abscess, unspecified: Secondary | ICD-10-CM | POA: Diagnosis not present

## 2024-02-10 NOTE — Assessment & Plan Note (Addendum)
 BP by report improved with a certain BP med reduced but he is not sure which one, will let us  know; pt asymptomatic, ok to continue meds as is; with the caveat he will likely need less medication in future if he conts to be successful with wt loss  BP Readings from Last 3 Encounters:  02/10/24 100/70  08/19/23 126/80  01/05/23 118/70

## 2024-02-10 NOTE — Patient Instructions (Signed)
 Please continue all other medications as before, and refills have been done if requested.  Please have the pharmacy call with any other refills you may need.  Please continue your efforts at being more active, low cholesterol diet, and weight control.  Please keep your appointments with your specialists as you may have planned  Please bring your [facility] lab results to your next visit  Please make an Appointment to return in 6 months, or sooner if needed

## 2024-02-10 NOTE — Progress Notes (Signed)
 Patient ID: [name], male   DOB: [birth year], 58 y.o.   MRN: [id number]        Chief Complaint: follow up htn, chest wall abscess, dm       HPI:  [name] is a 58 y.o. male here overall doing ok, Pt denies chest pain, increased sob or doe, wheezing, orthopnea, PND, increased LE swelling, palpitations, dizziness or syncope.   Pt denies polydipsia, polyuria, or new focal neuro s/s.   [facility] and found to have low normal BP and BP med reduced by half but cannot recall the name.  Also Now taking [facility]bound [facility] - wt loss of 6 lbs recenty, and peak wt has been 367.  Trying to get to under 300 lbs then Dr Ernie would to the left knee TKR.  Pain still severe. Also had recente antibiotic start with mid sternal abscess possible seb cyst, now much improved but also to see general surgury soon.   Pt reports labs at [facility] 1 wk ago with A1c 5.6, testosterone  over 400, cbc cmet tsh and lipids good per pt.   Wt Readings from Last 3 Encounters:  02/10/24 (!) 313 lb (142 kg)  09/22/23 (!) 319 lb (144.7 kg)  08/19/23 (!) 319 lb (144.7 kg)   BP Readings from Last 3 Encounters:  02/10/24 100/70  08/19/23 126/80  01/05/23 118/70         Past Medical History:  Diagnosis Date   Acid reflux    Alcohol abuse    Anxiety    Arthritis    Depression 07/08/2015   Diabetes mellitus (HCC)    Diabetes mellitus, type II (HCC)    DJD (degenerative joint disease)    Gallbladder problem    Gallstones    GERD (gastroesophageal reflux disease)    Gout    HTN (hypertension)    Hx of adenomatous polyp of colon 07/13/2021   Hyperglycemia    Hyperlipidemia    IBS (irritable bowel syndrome)    Joint pain    Low testosterone  in male    Male hypogonadism 07/08/2015   Obesity    Osteoarthritis    PONV (postoperative nausea and vomiting)    Sleep apnea    Past Surgical History:  Procedure Laterality Date   Back Cyst Excision     x2   CHOLECYSTECTOMY  08/11/2011   Procedure: LAPAROSCOPIC  CHOLECYSTECTOMY WITH INTRAOPERATIVE CHOLANGIOGRAM;  Surgeon: Redell Alm Faith, DO;  Location: [facility] OR;  Service: General;  Laterality: N/A;  Laparoscopic cholecystectomy with cholangiogram.   COLONOSCOPY WITH PROPOFOL  N/A 09/15/2015   Procedure: COLONOSCOPY WITH PROPOFOL ;  Surgeon: Lupita FORBES Commander, MD;  Location: [facility];  Service: Endoscopy;  Laterality: N/A;   JOINT REPLACEMENT  01/26/2011   right knee   KNEE ARTHROSCOPY     x 2 left   KNEE ARTHROSCOPY     x 2 right   REPAIR KNEE LIGAMENT     Left   RIGHT FOOT     BONES STRAIGHTENED OUT   2006   Shoulder Surgery, Reconstruction of Joint     Left x 3, Last 02/2007   Tennis elbow surgery, Nerve entrapment     x2 right    reports that he has never smoked. He has never been exposed to tobacco smoke. He has never used smokeless tobacco. He reports that he does not drink alcohol and does not use drugs. family history includes Colon cancer (age of onset: 48) in his brother;  High Cholesterol in his mother; Obesity in his mother; Ovarian cancer in his maternal grandmother; Sarcoidosis in his mother. No Known Allergies Current Outpatient Medications on File Prior to Visit  Medication Sig Dispense Refill   atorvastatin (LIPITOR) 20 MG tablet Take 20 mg by mouth daily.     busPIRone (BUSPAR) 15 MG tablet Take 15 mg by mouth at bedtime.     clobetasol (TEMOVATE) 0.05 % external solution      cromolyn (OPTICROM) 4 % ophthalmic solution INSTILL 1 DROP INTO EACH EYE 4 TIMES DAILY AT REGULAR INTERVALS.     cyclobenzaprine  (FLEXERIL ) 5 MG tablet Take 1 tablet (5 mg total) by mouth 3 (three) times daily as needed for muscle spasms. 40 tablet 5   diclofenac  sodium (VOLTAREN ) 1 % GEL Apply 4 g topically 4 (four) times daily as needed. 400 g 1   dicyclomine (BENTYL) 10 MG capsule      DULoxetine (CYMBALTA) 30 MG capsule Take 30 mg by mouth daily.     fluticasone  (FLONASE ) 50 MCG/ACT nasal spray SPRAY 2 SPRAYS INTO EACH NOSTRIL EVERY DAY 48 mL 2    gabapentin  (NEURONTIN ) 300 MG capsule Take one AM, one PM, two QHS 240 capsule 2   indomethacin  (INDOCIN ) 50 MG capsule TAKE ONE CAPSULE EVERY 6 HOURS AS NEEDED GOUT ONLY 60 capsule 2   loperamide (IMODIUM) 2 MG capsule      meloxicam  (MOBIC ) 7.5 MG tablet Take 7.5 mg by mouth daily.     metFORMIN  (GLUCOPHAGE ) 500 MG tablet TAKE 2 TABLETS BY MOUTH TWICE A DAY 360 tablet 1   metoprolol tartrate (LOPRESSOR) 25 MG tablet      olmesartan  (BENICAR ) 20 MG tablet TAKE 1 TABLET (20 MG TOTAL) BY MOUTH DAILY. ANNUAL APPT DUE IN SEPT MUST SEE PROVIDER FOR FUTURE REFILLS 90 tablet 1   omeprazole  (PRILOSEC) 40 MG capsule TAKE 1 CAPSULE BY MOUTH EVERY DAY 90 capsule 2   ondansetron  (ZOFRAN ) 4 MG tablet Take 1 tablet (4 mg total) by mouth every 8 (eight) hours as needed for nausea or vomiting. 20 tablet 0   prazosin  (MINIPRESS ) 2 MG capsule Take 1 capsule (2 mg total) by mouth at bedtime. 30 capsule 1   QUEtiapine (SEROQUEL) 25 MG tablet Take 25 mg by mouth at bedtime.     Semaglutide ,0.25 or 0.5MG /DOS, 2 MG/3ML SOPN Take 0.5 mg wubq [facility] weekly [facility]     sildenafil  (VIAGRA ) 100 MG tablet Take by mouth.     testosterone  (ANDRODERM ) 4 MG/24HR PT24 patch Place 1 patch onto the skin daily. 30 patch 2   traZODone  (DESYREL ) 50 MG tablet Take 1 tablet (50 mg total) by mouth at bedtime. 30 tablet 1   Vitamin D , Ergocalciferol , (DRISDOL ) 1.25 MG (50000 UNIT) CAPS capsule Take 1 capsule (50,000 Units total) by mouth every 7 (seven) days. 4 capsule 0   XIIDRA 5 % SOLN INSTILL 1 DROP INTO BOTH EYES TWICE A DAY     cetirizine (ZYRTEC) 10 MG tablet TAKE ONE TABLET BY MOUTH DAILY FOR ALLERGY (Patient not taking: Reported on 02/10/2024)     lamoTRIgine (LAMICTAL) 25 MG tablet Take 2 tablets by mouth daily. (Patient not taking: Reported on 02/10/2024)     No current facility-administered medications on file prior to visit.        ROS:  All others reviewed and negative.  Objective        PE:  BP 100/70   Pulse 84   Temp  98.1 F (36.7 C)  Ht 6' 1 (1.854 m)   Wt (!) 313 lb (142 kg)   SpO2 98%   BMI 41.30 kg/m                 Constitutional: Pt appears in NAD               HENT: Head: NCAT.                Right Ear: External ear normal.                 Left Ear: External ear normal.                Eyes: . Pupils are equal, round, and reactive to light. Conjunctivae and EOM are normal               Nose: without d/c or deformity               Neck: Neck supple. Gross normal ROM               Cardiovascular: Normal rate and regular rhythm.                 Pulmonary/Chest: Effort normal and breath sounds without rales or wheezing.                Abd:  Soft, NT, ND, + BS, no organomegaly               Neurological: Pt is alert. At baseline orientation, motor grossly intact               Skin: Skin is warm. No rashes,, LE edema - none, mid anterior chest wall with 1 cm area mild tender slightly raised erythematous but non fluctuant               Psychiatric: Pt behavior is normal without agitation   Micro: none  Cardiac tracings I have personally interpreted today:  none  Pertinent Radiological findings (summarize): none   Lab Results  Component Value Date   WBC 6.1 08/19/2023   HGB 15.1 08/19/2023   HCT 47.5 08/19/2023   PLT 175.0 08/19/2023   GLUCOSE 79 08/19/2023   CHOL 100 08/19/2023   TRIG 47.0 08/19/2023   HDL 42.80 08/19/2023   LDLCALC 48 08/19/2023   ALT 15 08/19/2023   AST 13 08/19/2023   NA 142 08/19/2023   K 3.9 08/19/2023   CL 104 08/19/2023   CREATININE 0.99 08/19/2023   BUN 8 08/19/2023   CO2 27 08/19/2023   TSH 1.47 08/19/2023   PSA 0.29 08/19/2023   INR 0.96 05/14/2011   HGBA1C 5.7 08/19/2023   Assessment/Plan:  [name] is a 58 y.o. Black or African American [2] male with  has a past medical history of Acid reflux, Alcohol abuse, Anxiety, Arthritis, Depression (07/08/2015), Diabetes mellitus (HCC), Diabetes mellitus, type II (HCC), DJD (degenerative joint disease),  Gallbladder problem, Gallstones, GERD (gastroesophageal reflux disease), Gout, HTN (hypertension), adenomatous polyp of colon (07/13/2021), Hyperglycemia, Hyperlipidemia, IBS (irritable bowel syndrome), Joint pain, Low testosterone  in male, Male hypogonadism (07/08/2015), Obesity, Osteoarthritis, PONV (postoperative nausea and vomiting), and Sleep apnea.  Vitamin B 12 deficiency Lab Results  Component Value Date   VITAMINB12 689 01/05/2023   Stable, cont oral replacement - b12 1000 mcg qd   Essential hypertension BP by report improved with a certain BP med reduced but he is not sure which one, will let us  know; pt asymptomatic, ok to continue  meds as is; with the caveat he will likely need less medication in future if he conts to be successful with wt loss  BP Readings from Last 3 Encounters:  02/10/24 100/70  08/19/23 126/80  01/05/23 118/70    Diabetes (HCC) Lab Results  Component Value Date   HGBA1C 5.7 08/19/2023   Stable, pt to continue current medical treatment metformin  500 mg - 2 bid, ozempic  2 mg weekly   Abscess Improved today, to finish antibx tx and f/u general surgury as he has planned  Followup: Return in about 6 months (around 08/12/2024).  Brendan Rush, MD 02/10/2024 1:09 PM  [facility]

## 2024-02-10 NOTE — Assessment & Plan Note (Signed)
Lab Results  Component Value Date   VITAMINB12 689 01/05/2023   Stable, cont oral replacement - b12 1000 mcg qd

## 2024-02-10 NOTE — Assessment & Plan Note (Signed)
 Lab Results  Component Value Date   HGBA1C 5.7 08/19/2023   Stable, pt to continue current medical treatment metformin  500 mg - 2 bid, ozempic  2 mg weekly

## 2024-02-10 NOTE — Assessment & Plan Note (Signed)
 Improved today, to finish antibx tx and f/u general surgury as he has planned

## 2024-02-16 ENCOUNTER — Ambulatory Visit: Payer: Medicare PPO | Admitting: Internal Medicine

## 2024-04-13 DIAGNOSIS — M25562 Pain in left knee: Secondary | ICD-10-CM | POA: Diagnosis not present

## 2024-04-13 DIAGNOSIS — Z96651 Presence of right artificial knee joint: Secondary | ICD-10-CM | POA: Diagnosis not present

## 2024-04-13 DIAGNOSIS — M1712 Unilateral primary osteoarthritis, left knee: Secondary | ICD-10-CM | POA: Diagnosis not present

## 2024-06-18 DIAGNOSIS — M1712 Unilateral primary osteoarthritis, left knee: Secondary | ICD-10-CM | POA: Diagnosis not present

## 2024-07-02 ENCOUNTER — Other Ambulatory Visit (HOSPITAL_COMMUNITY): Payer: Self-pay | Admitting: Otolaryngology

## 2024-07-02 DIAGNOSIS — K116 Mucocele of salivary gland: Secondary | ICD-10-CM

## 2024-07-03 ENCOUNTER — Other Ambulatory Visit (HOSPITAL_COMMUNITY): Payer: Self-pay | Admitting: Otolaryngology

## 2024-07-03 DIAGNOSIS — K116 Mucocele of salivary gland: Secondary | ICD-10-CM

## 2024-07-13 ENCOUNTER — Ambulatory Visit (INDEPENDENT_AMBULATORY_CARE_PROVIDER_SITE_OTHER): Payer: Medicare (Managed Care) | Admitting: Internal Medicine

## 2024-07-13 ENCOUNTER — Encounter: Payer: Self-pay | Admitting: Internal Medicine

## 2024-07-13 ENCOUNTER — Ambulatory Visit: Payer: Self-pay | Admitting: Internal Medicine

## 2024-07-13 VITALS — BP 122/78 | HR 85 | Temp 98.6°F | Ht 73.0 in | Wt 303.0 lb

## 2024-07-13 DIAGNOSIS — E538 Deficiency of other specified B group vitamins: Secondary | ICD-10-CM

## 2024-07-13 DIAGNOSIS — Z0000 Encounter for general adult medical examination without abnormal findings: Secondary | ICD-10-CM

## 2024-07-13 DIAGNOSIS — F331 Major depressive disorder, recurrent, moderate: Secondary | ICD-10-CM | POA: Insufficient documentation

## 2024-07-13 DIAGNOSIS — L723 Sebaceous cyst: Secondary | ICD-10-CM | POA: Insufficient documentation

## 2024-07-13 DIAGNOSIS — L720 Epidermal cyst: Secondary | ICD-10-CM | POA: Insufficient documentation

## 2024-07-13 DIAGNOSIS — E119 Type 2 diabetes mellitus without complications: Secondary | ICD-10-CM | POA: Diagnosis not present

## 2024-07-13 DIAGNOSIS — I10 Essential (primary) hypertension: Secondary | ICD-10-CM

## 2024-07-13 DIAGNOSIS — E1165 Type 2 diabetes mellitus with hyperglycemia: Secondary | ICD-10-CM

## 2024-07-13 DIAGNOSIS — Z7984 Long term (current) use of oral hypoglycemic drugs: Secondary | ICD-10-CM

## 2024-07-13 DIAGNOSIS — Z125 Encounter for screening for malignant neoplasm of prostate: Secondary | ICD-10-CM

## 2024-07-13 LAB — URINALYSIS, ROUTINE W REFLEX MICROSCOPIC
Bilirubin Urine: NEGATIVE
Hgb urine dipstick: NEGATIVE
Ketones, ur: NEGATIVE
Leukocytes,Ua: NEGATIVE
Nitrite: NEGATIVE
Specific Gravity, Urine: 1.02 (ref 1.000–1.030)
Total Protein, Urine: NEGATIVE
Urine Glucose: >=1000 — AB
Urobilinogen, UA: 0.2 (ref 0.0–1.0)
pH: 6 (ref 5.0–8.0)

## 2024-07-13 LAB — CBC WITH DIFFERENTIAL/PLATELET
Basophils Absolute: 0 K/uL (ref 0.0–0.1)
Basophils Relative: 0.4 % (ref 0.0–3.0)
Eosinophils Absolute: 0 K/uL (ref 0.0–0.7)
Eosinophils Relative: 0.6 % (ref 0.0–5.0)
HCT: 47.3 % (ref 39.0–52.0)
Hemoglobin: 15 g/dL (ref 13.0–17.0)
Lymphocytes Relative: 23.5 % (ref 12.0–46.0)
Lymphs Abs: 1.5 K/uL (ref 0.7–4.0)
MCHC: 31.8 g/dL (ref 30.0–36.0)
MCV: 82.3 fl (ref 78.0–100.0)
Monocytes Absolute: 0.5 K/uL (ref 0.1–1.0)
Monocytes Relative: 8.2 % (ref 3.0–12.0)
Neutro Abs: 4.3 K/uL (ref 1.4–7.7)
Neutrophils Relative %: 67.3 % (ref 43.0–77.0)
Platelets: 168 K/uL (ref 150.0–400.0)
RBC: 5.74 Mil/uL (ref 4.22–5.81)
RDW: 15.2 % (ref 11.5–15.5)
WBC: 6.4 K/uL (ref 4.0–10.5)

## 2024-07-13 LAB — HEPATIC FUNCTION PANEL
ALT: 20 U/L (ref 3–53)
AST: 13 U/L (ref 5–37)
Albumin: 4.3 g/dL (ref 3.5–5.2)
Alkaline Phosphatase: 58 U/L (ref 39–117)
Bilirubin, Direct: 0.3 mg/dL (ref 0.1–0.3)
Total Bilirubin: 1.2 mg/dL (ref 0.2–1.2)
Total Protein: 7.2 g/dL (ref 6.0–8.3)

## 2024-07-13 LAB — BASIC METABOLIC PANEL WITH GFR
BUN: 14 mg/dL (ref 6–23)
CO2: 28 meq/L (ref 19–32)
Calcium: 9.4 mg/dL (ref 8.4–10.5)
Chloride: 101 meq/L (ref 96–112)
Creatinine, Ser: 1.05 mg/dL (ref 0.40–1.50)
GFR: 77.63 mL/min
Glucose, Bld: 86 mg/dL (ref 70–99)
Potassium: 4.2 meq/L (ref 3.5–5.1)
Sodium: 137 meq/L (ref 135–145)

## 2024-07-13 LAB — LIPID PANEL
Cholesterol: 102 mg/dL (ref 28–200)
HDL: 40.4 mg/dL
LDL Cholesterol: 54 mg/dL (ref 10–99)
NonHDL: 61.35
Total CHOL/HDL Ratio: 3
Triglycerides: 38 mg/dL (ref 10.0–149.0)
VLDL: 7.6 mg/dL (ref 0.0–40.0)

## 2024-07-13 LAB — PSA: PSA: 0.23 ng/mL (ref 0.10–4.00)

## 2024-07-13 LAB — HEMOGLOBIN A1C: Hgb A1c MFr Bld: 5.6 % (ref 4.6–6.5)

## 2024-07-13 LAB — MICROALBUMIN / CREATININE URINE RATIO
Creatinine,U: 133.3 mg/dL
Microalb Creat Ratio: UNDETERMINED mg/g (ref 0.0–30.0)
Microalb, Ur: <0.7 mg/dL

## 2024-07-13 LAB — TSH: TSH: 1.58 u[IU]/mL (ref 0.35–5.50)

## 2024-07-13 MED ORDER — OLMESARTAN MEDOXOMIL 20 MG PO TABS: 10.0000 mg | ORAL_TABLET | Freq: Every day | ORAL | Status: AC

## 2024-07-13 MED ORDER — ZEPBOUND 10 MG/0.5ML ~~LOC~~ SOAJ: 10.0000 mg | SUBCUTANEOUS | Status: AC

## 2024-07-13 NOTE — Assessment & Plan Note (Signed)
Lab Results  Component Value Date   VITAMINB12 689 01/05/2023   Stable, cont oral replacement - b12 1000 mcg qd

## 2024-07-13 NOTE — Assessment & Plan Note (Signed)
 BP Readings from Last 3 Encounters:  07/13/24 122/78  02/10/24 100/70  08/19/23 126/80   Stable, pt to continue medical treatment benicar  20 qd

## 2024-07-13 NOTE — Progress Notes (Signed)
 Patient ID: [name], male   DOB: [birth year], 59 y.o.   MRN: [id number]         Chief Complaint:: wellness exam and DM       HPI:  [name] is a 59 y.o. male here for wellness exam; saw Dr Octavia eye exam dec 2025, declines hep b and covid booster, o/w up to date, also follows at the [facility] with endocrinology                        Also lost 15 lbs , due for  left knee TKR soon, but needs wt down further, peak wt has been 367.  Seen per ENT for left parotid gland cyst enlargement, for u/s and possible drainage soon.  To see Endo at [facility] in 3 days, plans to ask for increased zepbound  Pt denies chest pain, increased sob or doe, wheezing, orthopnea, PND, increased LE swelling, palpitations, dizziness or syncope.   Pt denies polydipsia, polyuria, or new focal neuro s/s.      Wt Readings from Last 3 Encounters:  07/13/24 (!) 303 lb (137.4 kg)  02/10/24 (!) 313 lb (142 kg)  09/22/23 (!) 319 lb (144.7 kg)   BP Readings from Last 3 Encounters:  07/13/24 122/78  02/10/24 100/70  08/19/23 126/80   Immunization History  Administered Date(s) Administered   Influenza Split 03/12/2016   Influenza, Seasonal, Injecte, Preservative Fre 05/02/2023, 04/27/2024   Influenza,inj,Quad PF,6+ Mos 07/08/2015, 03/15/2017, 09/29/2018, 03/23/2019, 05/15/2020   Influenza-Unspecified 08/13/2007, 06/12/2009, 04/11/2010, 05/27/2020, 03/31/2021, 06/02/2022   Moderna Sars-Covid-2 Vaccination 08/28/2019, 09/18/2019, 10/19/2019, 08/07/2020   PNEUMOCOCCAL CONJUGATE-20 06/05/2024   Pneumococcal Conjugate-13 03/23/2019   Pneumococcal Polysaccharide-23 03/12/2016, 04/27/2016   Tdap 10/19/2021   Zoster Recombinant(Shingrix) 11/16/2019, 01/28/2020, 04/29/2020   Health Maintenance Due  Topic Date Due   Hepatitis B Vaccines 19-59 Average Risk (1 of 3 - 19+ 3-dose series) Never done   COVID-19 Vaccine (5 - 2025-26 season) 03/12/2024      Past Medical History:  Diagnosis Date   Acid reflux    Alcohol abuse    Anxiety     Arthritis    Depression 07/08/2015   Diabetes mellitus (HCC)    Diabetes mellitus, type II (HCC)    DJD (degenerative joint disease)    Gallbladder problem    Gallstones    GERD (gastroesophageal reflux disease)    Gout    HTN (hypertension)    Hx of adenomatous polyp of colon 07/13/2021   Hyperglycemia    Hyperlipidemia    IBS (irritable bowel syndrome)    Joint pain    Low testosterone  in male    Male hypogonadism 07/08/2015   Obesity    Osteoarthritis    PONV (postoperative nausea and vomiting)    Sleep apnea    Past Surgical History:  Procedure Laterality Date   Back Cyst Excision     x2   CHOLECYSTECTOMY  08/11/2011   Procedure: LAPAROSCOPIC CHOLECYSTECTOMY WITH INTRAOPERATIVE CHOLANGIOGRAM;  Surgeon: Redell Alm Faith, DO;  Location: [facility] OR;  Service: General;  Laterality: N/A;  Laparoscopic cholecystectomy with cholangiogram.   COLONOSCOPY WITH PROPOFOL  N/A 09/15/2015   Procedure: COLONOSCOPY WITH PROPOFOL ;  Surgeon: Lupita FORBES Commander, MD;  Location: [facility];  Service: Endoscopy;  Laterality: N/A;   JOINT REPLACEMENT  01/26/2011   right knee   KNEE ARTHROSCOPY     x 2 left   KNEE ARTHROSCOPY     x 2 right   REPAIR KNEE LIGAMENT  Left   RIGHT FOOT     BONES STRAIGHTENED OUT   2006   Shoulder Surgery, Reconstruction of Joint     Left x 3, Last 02/2007   Tennis elbow surgery, Nerve entrapment     x2 right    reports that he has never smoked. He has never been exposed to tobacco smoke. He has never used smokeless tobacco. He reports that he does not drink alcohol and does not use drugs. family history includes Colon cancer (age of onset: 38) in his brother; High Cholesterol in his mother; Obesity in his mother; Ovarian cancer in his maternal grandmother; Sarcoidosis in his mother. Allergies[1] Medications Ordered Prior to Encounter[2]      ROS:  All others reviewed and negative.  Objective        PE:  BP 122/78 (BP Location: Right Arm, Patient Position: Sitting,  Cuff Size: Normal)   Pulse 85   Temp 98.6 F (37 C) (Oral)   Ht 6' 1 (1.854 m)   Wt (!) 303 lb (137.4 kg)   SpO2 98%   BMI 39.98 kg/m                 Constitutional: Pt appears in NAD               HENT: Head: NCAT.                Right Ear: External ear normal.                 Left Ear: External ear normal.                Eyes: . Pupils are equal, round, and reactive to light. Conjunctivae and EOM are normal               Nose: without d/c or deformity               Neck: Neck supple. Gross normal ROM               Cardiovascular: Normal rate and regular rhythm.                 Pulmonary/Chest: Effort normal and breath sounds without rales or wheezing.                Abd:  Soft, NT, ND, + BS, no organomegaly               Neurological: Pt is alert. At baseline orientation, motor grossly intact               Skin: Skin is warm. No rashes, no other new lesions, LE edema - none               Psychiatric: Pt behavior is normal without agitation   Micro: none  Cardiac tracings I have personally interpreted today:  none  Pertinent Radiological findings (summarize): none   Lab Results  Component Value Date   WBC 6.4 07/13/2024   HGB 15.0 07/13/2024   HCT 47.3 07/13/2024   PLT 168.0 07/13/2024   GLUCOSE 86 07/13/2024   CHOL 102 07/13/2024   TRIG 38.0 07/13/2024   HDL 40.40 07/13/2024   LDLCALC 54 07/13/2024   ALT 20 07/13/2024   AST 13 07/13/2024   NA 137 07/13/2024   K 4.2 07/13/2024   CL 101 07/13/2024   CREATININE 1.05 07/13/2024   BUN 14 07/13/2024   CO2 28 07/13/2024   TSH 1.58 07/13/2024  PSA 0.23 07/13/2024   INR 0.96 05/14/2011   HGBA1C 5.6 07/13/2024   MICROALBUR <0.7 07/13/2024   Assessment/Plan:  [name] is a 59 y.o. Black or African American [2] male with  has a past medical history of Acid reflux, Alcohol abuse, Anxiety, Arthritis, Depression (07/08/2015), Diabetes mellitus (HCC), Diabetes mellitus, type II (HCC), DJD (degenerative joint disease),  Gallbladder problem, Gallstones, GERD (gastroesophageal reflux disease), Gout, HTN (hypertension), adenomatous polyp of colon (07/13/2021), Hyperglycemia, Hyperlipidemia, IBS (irritable bowel syndrome), Joint pain, Low testosterone  in male, Male hypogonadism (07/08/2015), Obesity, Osteoarthritis, PONV (postoperative nausea and vomiting), and Sleep apnea.  Preventative health care Age and sex appropriate education and counseling updated with regular exercise and diet Referrals for preventative services - none needed Immunizations addressed - declines hep b and covid booster Smoking counseling  - none needed Evidence for depression or other mood disorder - chronic stable anxiety Most recent labs reviewed. I have personally reviewed and have noted: 1) the patient's medical and social history 2) The patient's current medications and supplements 3) The patient's height, weight, and BMI have been recorded in the chart   Vitamin B 12 deficiency Lab Results  Component Value Date   VITAMINB12 689 01/05/2023   Stable, cont oral replacement - b12 1000 mcg qd   Essential hypertension BP Readings from Last 3 Encounters:  07/13/24 122/78  02/10/24 100/70  08/19/23 126/80   Stable, pt to continue medical treatment benicar  20 qd   Diabetes (HCC) Lab Results  Component Value Date   HGBA1C 5.6 07/13/2024   Stable, pt to continue current medical treatment metformin  500 mg 2 tab bid  Followup: Return in about 1 year (around 07/13/2025).  Lynwood Rush, MD 07/13/2024 12:58 PM [facility]     [1] No Known Allergies [2]  Current Outpatient Medications on File Prior to Visit  Medication Sig Dispense Refill   atorvastatin (LIPITOR) 20 MG tablet Take 20 mg by mouth daily.     busPIRone (BUSPAR) 15 MG tablet Take 15 mg by mouth at bedtime.     cetirizine (ZYRTEC) 10 MG tablet TAKE ONE TABLET BY MOUTH DAILY FOR ALLERGY (Patient not  taking: Reported on 02/10/2024)     clobetasol (TEMOVATE) 0.05 % external solution      cromolyn (OPTICROM) 4 % ophthalmic solution INSTILL 1 DROP INTO EACH EYE 4 TIMES DAILY AT REGULAR INTERVALS.     cyclobenzaprine  (FLEXERIL ) 5 MG tablet Take 1 tablet (5 mg total) by mouth 3 (three) times daily as needed for muscle spasms. 40 tablet 5   diclofenac  sodium (VOLTAREN ) 1 % GEL Apply 4 g topically 4 (four) times daily as needed. 400 g 1   dicyclomine (BENTYL) 10 MG capsule      DULoxetine (CYMBALTA) 30 MG capsule Take 30 mg by mouth daily.     fluticasone  (FLONASE ) 50 MCG/ACT nasal spray SPRAY 2 SPRAYS INTO EACH NOSTRIL EVERY DAY 48 mL 2   gabapentin  (NEURONTIN ) 300 MG capsule Take one AM, one PM, two QHS 240 capsule 2   indomethacin  (INDOCIN ) 50 MG capsule TAKE ONE CAPSULE EVERY 6 HOURS AS NEEDED GOUT ONLY 60 capsule 2   lamoTRIgine (LAMICTAL) 25 MG tablet Take 2 tablets by mouth daily. (Patient not taking: Reported on 02/10/2024)     loperamide (IMODIUM) 2 MG capsule      meloxicam  (MOBIC ) 7.5 MG tablet Take 7.5 mg by mouth daily.     metFORMIN  (GLUCOPHAGE ) 500 MG tablet  TAKE 2 TABLETS BY MOUTH TWICE A DAY 360 tablet 1   omeprazole  (PRILOSEC) 40 MG capsule TAKE 1 CAPSULE BY MOUTH EVERY DAY 90 capsule 2   ondansetron  (ZOFRAN ) 4 MG tablet Take 1 tablet (4 mg total) by mouth every 8 (eight) hours as needed for nausea or vomiting. 20 tablet 0   prazosin  (MINIPRESS ) 2 MG capsule Take 1 capsule (2 mg total) by mouth at bedtime. 30 capsule 1   QUEtiapine (SEROQUEL) 25 MG tablet Take 25 mg by mouth at bedtime.     sildenafil  (VIAGRA ) 100 MG tablet Take by mouth.     testosterone  (ANDRODERM ) 4 MG/24HR PT24 patch Place 1 patch onto the skin daily. 30 patch 2   traZODone  (DESYREL ) 50 MG tablet Take 1 tablet (50 mg total) by mouth at bedtime. 30 tablet 1   Vitamin D , Ergocalciferol , (DRISDOL ) 1.25 MG (50000 UNIT) CAPS capsule Take 1 capsule (50,000 Units total) by mouth every 7 (seven) days. 4 capsule 0    XIIDRA 5 % SOLN INSTILL 1 DROP INTO BOTH EYES TWICE A DAY     No current facility-administered medications on file prior to visit.

## 2024-07-13 NOTE — Assessment & Plan Note (Signed)
 Lab Results  Component Value Date   HGBA1C 5.6 07/13/2024   Stable, pt to continue current medical treatment metformin  500 mg 2 tab bid

## 2024-07-13 NOTE — Assessment & Plan Note (Addendum)
 Age and sex appropriate education and counseling updated with regular exercise and diet Referrals for preventative services - none needed Immunizations addressed - declines hep b and covid booster Smoking counseling  - none needed Evidence for depression or other mood disorder - chronic stable anxiety Most recent labs reviewed. I have personally reviewed and have noted: 1) the patient's medical and social history 2) The patient's current medications and supplements 3) The patient's height, weight, and BMI have been recorded in the chart

## 2024-07-13 NOTE — Patient Instructions (Signed)
 Please continue all other medications as before, and refills have been done if requested.  Please have the pharmacy call with any other refills you may need.  Please continue your efforts at being more active, low cholesterol diet, and weight control.  You are otherwise up to date with prevention measures today.  Please keep your appointments with your specialists as you may have planned - [facility]inology at [facility]  Please go to the LAB at the blood drawing area for the tests to be done  You will be contacted by phone if any changes need to be made immediately.  Otherwise, you will receive a letter about your results with an explanation, but please check with MyChart first.  Please make an Appointment to return for your 1 year visit, or sooner if needed

## 2024-07-13 NOTE — Progress Notes (Signed)
 The test results show that your current treatment is OK, as the tests are stable.  Please continue the same plan.  There is no other need for change of treatment or further evaluation based on these results, at this time.  thanks

## 2024-07-17 ENCOUNTER — Encounter: Payer: Self-pay | Admitting: Radiology

## 2024-07-17 NOTE — Progress Notes (Signed)
 Have spoken with pt several times to try to sched US  Thyroid  and US  FNA - pt actively refuses scheduling of either test until [facility] order is obtained - called office to let know and inquire about ability to get [facility] order or see how physician would like to proceed.

## 2024-07-19 ENCOUNTER — Encounter (INDEPENDENT_AMBULATORY_CARE_PROVIDER_SITE_OTHER): Payer: Medicare PPO | Admitting: Ophthalmology

## 2024-08-09 ENCOUNTER — Ambulatory Visit (HOSPITAL_COMMUNITY)
Admission: RE | Admit: 2024-08-09 | Discharge: 2024-08-09 | Disposition: A | Discharge status: Home / Self Care | Payer: Self-pay | Source: Ambulatory Visit | Attending: Otolaryngology | Admitting: Otolaryngology

## 2024-08-09 DIAGNOSIS — K116 Mucocele of salivary gland: Secondary | ICD-10-CM | POA: Diagnosis present

## 2024-08-10 ENCOUNTER — Encounter: Payer: Self-pay | Admitting: Radiology

## 2024-08-10 NOTE — Progress Notes (Signed)
 [name] POUR, MD  [name], RT Approved for US  guided aspiration of large LEFT PAROTID CYST.  Send fluid for cytology.  No sedation  HKM       Previous Messages    ----- Message ----- From: [name], RT Sent: 08/10/2024  10:46 AM EST To: Ir Procedure Requests Subject: US  FNA SALIVARY GLAND/PAROTID GLAND            Procedure :US  FNA SALIVARY GLAND/PAROTID GLAND  Reason :parotid mass FNA, therapeutic aspiration, cytologic eval, Dx: Parotid cyst [K11.6 (ICD-10-CM)]  History :US  SOFT TISSUE HEAD & NECK (NON-THYROID ) (Accession [id number]) (Order [id number])  Provider: Jesus Oliphant, MD  Provider contact ; [phone]

## 2024-08-14 ENCOUNTER — Ambulatory Visit: Admitting: Internal Medicine

## 2024-08-31 ENCOUNTER — Ambulatory Visit (HOSPITAL_COMMUNITY): Payer: Medicare (Managed Care)

## 2024-09-24 ENCOUNTER — Ambulatory Visit

## 2024-11-02 ENCOUNTER — Encounter (INDEPENDENT_AMBULATORY_CARE_PROVIDER_SITE_OTHER): Admitting: Ophthalmology
